# Patient Record
Sex: Female | Born: 1960 | Race: White | Hispanic: No | Marital: Married | Smoking: Never smoker
Health system: Southern US, Community
[De-identification: ages and names within clinical notes are randomized; demographics above are authoritative.]

## PROBLEM LIST (undated history)

## (undated) ENCOUNTER — Emergency Department (HOSPITAL_COMMUNITY): Admission: EM | Payer: Self-pay | Source: Home / Self Care

## (undated) DIAGNOSIS — I10 Essential (primary) hypertension: Secondary | ICD-10-CM

## (undated) DIAGNOSIS — D869 Sarcoidosis, unspecified: Secondary | ICD-10-CM

## (undated) DIAGNOSIS — N393 Stress incontinence (female) (male): Secondary | ICD-10-CM

## (undated) DIAGNOSIS — D071 Carcinoma in situ of vulva: Secondary | ICD-10-CM

## (undated) DIAGNOSIS — J349 Unspecified disorder of nose and nasal sinuses: Secondary | ICD-10-CM

## (undated) DIAGNOSIS — Z86010 Personal history of colonic polyps: Secondary | ICD-10-CM

## (undated) DIAGNOSIS — R87629 Unspecified abnormal cytological findings in specimens from vagina: Secondary | ICD-10-CM

## (undated) DIAGNOSIS — E079 Disorder of thyroid, unspecified: Secondary | ICD-10-CM

## (undated) DIAGNOSIS — E785 Hyperlipidemia, unspecified: Secondary | ICD-10-CM

## (undated) DIAGNOSIS — Z87411 Personal history of vaginal dysplasia: Secondary | ICD-10-CM

## (undated) DIAGNOSIS — R000 Tachycardia, unspecified: Secondary | ICD-10-CM

## (undated) DIAGNOSIS — U071 COVID-19: Secondary | ICD-10-CM

## (undated) DIAGNOSIS — E039 Hypothyroidism, unspecified: Secondary | ICD-10-CM

## (undated) DIAGNOSIS — T7840XA Allergy, unspecified, initial encounter: Secondary | ICD-10-CM

## (undated) DIAGNOSIS — Z860101 Personal history of adenomatous and serrated colon polyps: Secondary | ICD-10-CM

## (undated) DIAGNOSIS — A64 Unspecified sexually transmitted disease: Secondary | ICD-10-CM

## (undated) DIAGNOSIS — D863 Sarcoidosis of skin: Secondary | ICD-10-CM

## (undated) DIAGNOSIS — Z87410 Personal history of cervical dysplasia: Secondary | ICD-10-CM

## (undated) DIAGNOSIS — Z973 Presence of spectacles and contact lenses: Secondary | ICD-10-CM

## (undated) HISTORY — DX: Disorder of thyroid, unspecified: E07.9

## (undated) HISTORY — DX: Hyperlipidemia, unspecified: E78.5

## (undated) HISTORY — PX: BREAST SURGERY: SHX581

## (undated) HISTORY — DX: Allergy, unspecified, initial encounter: T78.40XA

## (undated) HISTORY — PX: TEAR DUCT PROBING: SHX793

## (undated) HISTORY — DX: Tachycardia, unspecified: R00.0

## (undated) HISTORY — PX: ABDOMINAL HYSTERECTOMY: SHX81

## (undated) HISTORY — DX: COVID-19: U07.1

## (undated) HISTORY — DX: Unspecified abnormal cytological findings in specimens from vagina: R87.629

## (undated) HISTORY — DX: Essential (primary) hypertension: I10

## (undated) HISTORY — DX: Unspecified sexually transmitted disease: A64

## (undated) HISTORY — DX: Sarcoidosis, unspecified: D86.9

---

## 1997-09-17 HISTORY — PX: BREAST EXCISIONAL BIOPSY: SUR124

## 1998-09-17 HISTORY — PX: TOTAL ABDOMINAL HYSTERECTOMY W/ BILATERAL SALPINGOOPHORECTOMY: SHX83

## 1998-09-17 HISTORY — PX: VAGINAL HYSTERECTOMY: SUR661

## 1999-09-18 HISTORY — PX: LACRIMAL DUCT EXPLORATION: SHX6569

## 2005-02-13 ENCOUNTER — Ambulatory Visit: Payer: Self-pay

## 2006-04-02 ENCOUNTER — Ambulatory Visit: Payer: Self-pay

## 2007-11-19 ENCOUNTER — Ambulatory Visit: Payer: Self-pay

## 2008-03-15 ENCOUNTER — Ambulatory Visit: Payer: Self-pay | Admitting: Dermatology

## 2008-06-21 ENCOUNTER — Ambulatory Visit: Payer: Self-pay | Admitting: Internal Medicine

## 2009-08-08 ENCOUNTER — Ambulatory Visit: Payer: Self-pay

## 2010-11-08 ENCOUNTER — Ambulatory Visit: Payer: Self-pay

## 2012-01-31 ENCOUNTER — Ambulatory Visit: Payer: Self-pay

## 2012-09-17 HISTORY — PX: COLONOSCOPY: SHX174

## 2012-09-29 ENCOUNTER — Ambulatory Visit: Payer: Self-pay | Admitting: Gastroenterology

## 2012-09-29 LAB — HM COLONOSCOPY

## 2013-07-24 ENCOUNTER — Ambulatory Visit: Payer: Self-pay

## 2014-10-21 ENCOUNTER — Ambulatory Visit: Payer: Self-pay | Admitting: Nurse Practitioner

## 2015-05-04 ENCOUNTER — Other Ambulatory Visit: Payer: Self-pay | Admitting: Unknown Physician Specialty

## 2015-05-04 NOTE — Telephone Encounter (Signed)
Pt called stated she needs a refill Trotranalol. Pharm is [facility]. Thanks.

## 2015-05-05 MED ORDER — PROPRANOLOL HCL 10 MG PO TABS: 10.0000 mg | ORAL_TABLET | Freq: Three times a day (TID) | ORAL | Status: DC

## 2015-05-05 NOTE — Telephone Encounter (Signed)
I looked in practice partner and I think Danielle Sanford meant propranolol. Patient was last seen on 10/19/14, practice partner number is [phone], and pharmacy is [facility].

## 2015-05-05 NOTE — Telephone Encounter (Signed)
Pt needs seen further refills 

## 2015-05-11 NOTE — Telephone Encounter (Signed)
Pt scheduled for 05/20/15 @ 4pm. Thanks.

## 2015-05-19 DIAGNOSIS — D869 Sarcoidosis, unspecified: Secondary | ICD-10-CM

## 2015-05-19 DIAGNOSIS — J309 Allergic rhinitis, unspecified: Secondary | ICD-10-CM | POA: Insufficient documentation

## 2015-05-19 DIAGNOSIS — R000 Tachycardia, unspecified: Secondary | ICD-10-CM

## 2015-05-19 DIAGNOSIS — E785 Hyperlipidemia, unspecified: Secondary | ICD-10-CM

## 2015-05-19 DIAGNOSIS — E039 Hypothyroidism, unspecified: Secondary | ICD-10-CM

## 2015-05-19 DIAGNOSIS — E063 Autoimmune thyroiditis: Secondary | ICD-10-CM | POA: Insufficient documentation

## 2015-05-19 DIAGNOSIS — I10 Essential (primary) hypertension: Secondary | ICD-10-CM

## 2015-05-20 ENCOUNTER — Ambulatory Visit (INDEPENDENT_AMBULATORY_CARE_PROVIDER_SITE_OTHER): Payer: BLUE CROSS/BLUE SHIELD | Admitting: Unknown Physician Specialty

## 2015-05-20 ENCOUNTER — Encounter: Payer: Self-pay | Admitting: Unknown Physician Specialty

## 2015-05-20 VITALS — BP 143/85 | HR 80 | Temp 97.8°F | Ht 68.1 in | Wt 214.0 lb

## 2015-05-20 DIAGNOSIS — I10 Essential (primary) hypertension: Secondary | ICD-10-CM | POA: Diagnosis not present

## 2015-05-20 DIAGNOSIS — E785 Hyperlipidemia, unspecified: Secondary | ICD-10-CM

## 2015-05-20 DIAGNOSIS — E039 Hypothyroidism, unspecified: Secondary | ICD-10-CM

## 2015-05-20 LAB — LIPID PANEL PICCOLO, WAIVED
Chol/HDL Ratio Piccolo,Waive: 4.5 mg/dL
Cholesterol Piccolo, Waived: 133 mg/dL (ref <200)
HDL Chol Piccolo, Waived: 30 mg/dL — ABNORMAL LOW (ref >59)
LDL Chol Calc Piccolo Waived: 59 mg/dL (ref <100)
Triglycerides Piccolo,Waived: 222 mg/dL — ABNORMAL HIGH (ref <150)
VLDL Chol Calc Piccolo,Waive: 44 mg/dL — ABNORMAL HIGH (ref <30)

## 2015-05-20 LAB — MICROALBUMIN, URINE WAIVED
Creatinine, Urine Waived: 200 mg/dL (ref 10–300)
MICROALB, UR WAIVED: 10 mg/L (ref 0–19)
Microalb/Creat Ratio: <30 mg/g (ref <30)

## 2015-05-20 NOTE — Progress Notes (Signed)
   BP 143/85 mmHg  Pulse 80  Temp(Src) 97.8 F (36.6 C)  Ht 5' 8.1" (1.73 m)  Wt 214 lb (97.07 kg)  BMI 32.43 kg/m2  SpO2 95%  LMP  (LMP Unknown)   Subjective:    Patient ID: Danielle Sanford, female    DOB: Apr 28, 1961, 54 y.o.   MRN: [id number]  HPI: Danielle Sanford is a 54 y.o. female  Chief Complaint  Patient presents with  . Hyperlipidemia  . Hypertension  . Hypothyroidism   Hypertension/Hyperlipidemia: She does not monitor blood pressure at home. This is elevated at todays visit but she has not taken her second dose of propanolol for the day. She is compliant with her propanolol and atorvastatin. She denies headaches, chest pain, or shortness of breath.   Hypothyroidism: She is still fatigued even after increase in dose of levothyroxine in February. She is compliant and take medication daily as directed.   Relevant past medical, surgical, family and social history reviewed and updated as indicated. Interim medical history since our last visit reviewed. Allergies and medications reviewed and updated.  Review of Systems  Constitutional: Negative.  Negative for activity change, appetite change and fatigue.  HENT: Negative.   Respiratory: Negative.  Negative for cough, choking, chest tightness, shortness of breath, wheezing and stridor.   Cardiovascular: Negative.  Negative for chest pain, palpitations and leg swelling.  Psychiatric/Behavioral: Negative.  Negative for sleep disturbance and agitation. The patient is not nervous/anxious.     Per HPI unless specifically indicated above     Objective:    BP 143/85 mmHg  Pulse 80  Temp(Src) 97.8 F (36.6 C)  Ht 5' 8.1" (1.73 m)  Wt 214 lb (97.07 kg)  BMI 32.43 kg/m2  SpO2 95%  LMP  (LMP Unknown)  Wt Readings from Last 3 Encounters:  05/20/15 214 lb (97.07 kg)  04/20/14 198 lb (89.812 kg)    Physical Exam  Constitutional: She is oriented to person, place, and time. She appears well-developed and well-nourished.  No distress.  HENT:  Head: Normocephalic and atraumatic.  Cardiovascular: Normal rate, regular rhythm and normal heart sounds.  Exam reveals no gallop and no friction rub.   No murmur heard. Pulmonary/Chest: Effort normal and breath sounds normal. No respiratory distress. She has no wheezes. She has no rales. She exhibits no tenderness.  Musculoskeletal: Normal range of motion.  Neurological: She is alert and oriented to person, place, and time.  Skin: Skin is warm and dry. No rash noted. She is not diaphoretic. No erythema. No pallor.  Psychiatric: She has a normal mood and affect. Her behavior is normal. Judgment and thought content normal.        Assessment & Plan:   Problem List Items Addressed This Visit      Unprioritized   Hyperlipidemia    Well controlled on atorvastatin .       Relevant Orders   Lipid Panel Piccolo, Waived   Hypothyroidism    Last TSH 7.28 on 10/19/14. Increased her levothyroxine to . Recheck TSH today, awaiting results.      Relevant Orders   TSH   Hypertension - Primary    Elevated blood pressure 143//85. Will recheck in three months      Relevant Orders   Comprehensive metabolic panel   Microalbumin, Urine Waived   Uric acid       Follow up plan: Return in about 3 months (around 08/19/2015) for to recheck blood pressure.

## 2015-05-20 NOTE — Assessment & Plan Note (Addendum)
Elevated blood pressure 143//85. Currently taking Propanolol  three times daily. Will recheck in three months. Monitor at home.

## 2015-05-20 NOTE — Assessment & Plan Note (Signed)
Last TSH 7.28 on 10/19/14. Increased her levothyroxine to . Recheck TSH today, awaiting results.

## 2015-05-20 NOTE — Assessment & Plan Note (Addendum)
Well controlled on atorvastatin . Levels improved today.

## 2015-05-21 LAB — COMPREHENSIVE METABOLIC PANEL
ALBUMIN: 3.9 g/dL (ref 3.5–5.5)
ALT: 16 IU/L (ref 0–32)
AST: 19 IU/L (ref 0–40)
Albumin/Globulin Ratio: 1.3 (ref 1.1–2.5)
Alkaline Phosphatase: 102 IU/L (ref 39–117)
BUN/Creatinine Ratio: 16 (ref 9–23)
BUN: 14 mg/dL (ref 6–24)
Bilirubin Total: 0.3 mg/dL (ref 0.0–1.2)
CALCIUM: 9.1 mg/dL (ref 8.7–10.2)
CO2: 26 mmol/L (ref 18–29)
CREATININE: 0.9 mg/dL (ref 0.57–1.00)
Chloride: 100 mmol/L (ref 97–108)
GFR calc non Af Amer: 73 mL/min/{1.73_m2} (ref >59)
GFR, EST AFRICAN AMERICAN: 84 mL/min/{1.73_m2} (ref >59)
GLOBULIN, TOTAL: 3 g/dL (ref 1.5–4.5)
Glucose: 94 mg/dL (ref 65–99)
Potassium: 4 mmol/L (ref 3.5–5.2)
SODIUM: 140 mmol/L (ref 134–144)
TOTAL PROTEIN: 6.9 g/dL (ref 6.0–8.5)

## 2015-05-21 LAB — TSH: TSH: 8.98 u[IU]/mL — ABNORMAL HIGH (ref 0.450–4.500)

## 2015-05-21 LAB — URIC ACID: Uric Acid: 5 mg/dL (ref 2.5–7.1)

## 2015-05-24 ENCOUNTER — Other Ambulatory Visit: Payer: Self-pay | Admitting: Unknown Physician Specialty

## 2015-05-24 DIAGNOSIS — E039 Hypothyroidism, unspecified: Secondary | ICD-10-CM

## 2015-05-24 MED ORDER — LEVOTHYROXINE SODIUM 150 MCG PO TABS: 150.0000 ug | ORAL_TABLET | Freq: Every day | ORAL | Status: DC

## 2015-05-24 NOTE — Progress Notes (Signed)
Reviewed TSH level with patient. She reports being compliant and taking her levothyroxine every morning. TSH was 7.28 in February and her levothyroxine dose was increased to . At recheck on 9/2 her TSH level was 8.98. We will increase her levothyroxine to and repeat labs in three months. Patient aware of plan and verbalizes understanding. New prescription sent to [facility].

## 2015-07-19 ENCOUNTER — Telehealth: Payer: Self-pay | Admitting: Unknown Physician Specialty

## 2015-07-19 MED ORDER — PROPRANOLOL HCL 10 MG PO TABS: 10.0000 mg | ORAL_TABLET | Freq: Three times a day (TID) | ORAL | Status: DC

## 2015-07-19 NOTE — Telephone Encounter (Signed)
[facility] the [facility] [place] so if you decide to refill this, please pay attention to [facility] and then we can change the pharmacy back to [facility] and send in more to them.

## 2015-07-19 NOTE — Telephone Encounter (Signed)
Pt came by needs a refill on Propanolol. Pt stated she is completely out of the medication. Please send RX to pharmacy ASAP. Pt  needs this medication sent to [facility] pt also needs enough to last until these through the [facility] [place] [place]. Thanks.

## 2015-07-19 NOTE — Telephone Encounter (Signed)
Called and let patient know that medications were sent in.  

## 2015-07-19 NOTE — Telephone Encounter (Signed)
Pt is due to be seen.  I will call in [facility] for [facility]

## 2015-07-19 NOTE — Telephone Encounter (Signed)
Patient was seen on 05/20/15 and has a 3 month f/u scheduled for 08/19/15.

## 2015-07-19 NOTE — Telephone Encounter (Signed)
Thanks, didn't see that

## 2015-08-19 ENCOUNTER — Encounter: Payer: Self-pay | Admitting: Unknown Physician Specialty

## 2015-08-19 ENCOUNTER — Ambulatory Visit (INDEPENDENT_AMBULATORY_CARE_PROVIDER_SITE_OTHER): Payer: BLUE CROSS/BLUE SHIELD | Admitting: Unknown Physician Specialty

## 2015-08-19 VITALS — BP 113/79 | HR 84 | Temp 98.3°F | Ht 67.0 in | Wt 218.0 lb

## 2015-08-19 DIAGNOSIS — E039 Hypothyroidism, unspecified: Secondary | ICD-10-CM

## 2015-08-19 DIAGNOSIS — Z23 Encounter for immunization: Secondary | ICD-10-CM

## 2015-08-19 DIAGNOSIS — I10 Essential (primary) hypertension: Secondary | ICD-10-CM

## 2015-08-19 MED ORDER — ATORVASTATIN CALCIUM 20 MG PO TABS: 20.0000 mg | ORAL_TABLET | Freq: Every day | ORAL | Status: DC

## 2015-08-19 NOTE — Addendum Note (Signed)
Addended by: Gabriel CirriWICKER, Vanshika Jastrzebski on: 08/19/2015 04:41 PM   Modules accepted: Orders, SmartSet

## 2015-08-19 NOTE — Progress Notes (Signed)
BP 113/79 mmHg  Pulse 84  Temp(Src) 98.3 F (36.8 C)  Ht  (1.702 m)  Wt 218 lb (98.884 kg)  BMI 34.14 kg/m2  SpO2 96%  LMP  (LMP Unknown)   Subjective:    Patient ID: Danielle Sanford, female    DOB: 06/17/1961, 54 y.o.   MRN: [id number]  HPI: Danielle Sanford is a 54 y.o. female  Chief Complaint  Patient presents with  . Hyperlipidemia  . Hypothyroidism  . Medication Refill    pt states she needs refill on all medications   Hypothyroid Adjusting doses.  Needs a TSH today.    Hypertension Using medications without difficulty Average home BPs   No problems or lightheadedness No chest pain with exertion or shortness of breath No Edema   Relevant past medical, surgical, family and social history reviewed and updated as indicated. Interim medical history since our last visit reviewed. Allergies and medications reviewed and updated.  Review of Systems  Per HPI unless specifically indicated above     Objective:    BP 113/79 mmHg  Pulse 84  Temp(Src) 98.3 F (36.8 C)  Ht  (1.702 m)  Wt 218 lb (98.884 kg)  BMI 34.14 kg/m2  SpO2 96%  LMP  (LMP Unknown)  Wt Readings from Last 3 Encounters:  08/19/15 218 lb (98.884 kg)  05/20/15 214 lb (97.07 kg)  04/20/14 198 lb (89.812 kg)    Physical Exam  Constitutional: She is oriented to person, place, and time. She appears well-developed and well-nourished. No distress.  HENT:  Head: Normocephalic and atraumatic.  Eyes: Conjunctivae and lids are normal. Right eye exhibits no discharge. Left eye exhibits no discharge. No scleral icterus.  Neck: Normal range of motion. Neck supple. No JVD present. Carotid bruit is not present.  Cardiovascular: Normal rate, regular rhythm and normal heart sounds.   Pulmonary/Chest: Effort normal and breath sounds normal.  Abdominal: Normal appearance. There is no splenomegaly or hepatomegaly.  Musculoskeletal: Normal range of motion.  Neurological: She is alert and oriented  to person, place, and time.  Skin: Skin is warm, dry and intact. No rash noted. No pallor.  Psychiatric: She has a normal mood and affect. Her behavior is normal. Judgment and thought content normal.    Results for orders placed or performed in visit on 05/20/15  TSH  Result Value Ref Range   TSH 8.980 (H) 0.450 - 4.500 uIU/mL  Comprehensive metabolic panel  Result Value Ref Range   Glucose 94 65 - 99 mg/dL   BUN 14 6 - 24 mg/dL   Creatinine, Ser 4.09 0.57 - 1.00 mg/dL   GFR calc non Af Amer 73 >59 mL/min/1.73   GFR calc Af Amer 84 >59 mL/min/1.73   BUN/Creatinine Ratio 16 9 - 23   Sodium 140 134 - 144 mmol/L   Potassium 4.0 3.5 - 5.2 mmol/L   Chloride 100 97 - 108 mmol/L   CO2 26 18 - 29 mmol/L   Calcium 9.1 8.7 - 10.2 mg/dL   Total Protein 6.9 6.0 - 8.5 g/dL   Albumin 3.9 3.5 - 5.5 g/dL   Globulin, Total 3.0 1.5 - 4.5 g/dL   Albumin/Globulin Ratio 1.3 1.1 - 2.5   Bilirubin Total 0.3 0.0 - 1.2 mg/dL   Alkaline Phosphatase 102 39 - 117 IU/L   AST 19 0 - 40 IU/L   ALT 16 0 - 32 IU/L  Microalbumin, Urine Waived  Result Value Ref Range   Microalb, Ur  Waived 10 0 - 19 mg/L   Creatinine, Urine Waived 200 10 - 300 mg/dL   Microalb/Creat Ratio <30 <30 mg/g  Uric acid  Result Value Ref Range   Uric Acid 5.0 2.5 - 7.1 mg/dL  Lipid Panel Piccolo, Waived  Result Value Ref Range   Cholesterol Piccolo, Waived 133 <200 mg/dL   HDL Chol Piccolo, Waived 30 (L) >59 mg/dL   Triglycerides Piccolo,Waived 222 (H) <150 mg/dL   Chol/HDL Ratio Piccolo,Waive 4.5 mg/dL   LDL Chol Calc Piccolo Waived 59 <100 mg/dL   VLDL Chol Calc Piccolo,Waive 44 (H) <30 mg/dL      Assessment & Plan:   Problem List Items Addressed This Visit      Unprioritized   Hypothyroidism    Check TSH      Hypertension    Stable, continue present medications.       Relevant Medications   atorvastatin (LIPITOR) 20 MG tablet    Other Visit Diagnoses    Immunization due    -  Primary    Relevant Orders     Flu Vaccine QUAD 36+ mos IM (Completed)        Follow up plan: Return in about 6 months (around 02/17/2016).

## 2015-08-19 NOTE — Assessment & Plan Note (Signed)
Stable, continue present medications.   

## 2015-08-19 NOTE — Assessment & Plan Note (Signed)
Check TSH 

## 2015-08-20 LAB — TSH: TSH: 6.31 u[IU]/mL — ABNORMAL HIGH (ref 0.450–4.500)

## 2015-08-22 ENCOUNTER — Other Ambulatory Visit: Payer: Self-pay | Admitting: Unknown Physician Specialty

## 2015-08-22 DIAGNOSIS — E039 Hypothyroidism, unspecified: Secondary | ICD-10-CM

## 2015-08-22 MED ORDER — LEVOTHYROXINE SODIUM 175 MCG PO TABS: 175.0000 ug | ORAL_TABLET | Freq: Every day | ORAL | Status: DC

## 2015-10-26 ENCOUNTER — Other Ambulatory Visit: Payer: Self-pay

## 2015-10-26 MED ORDER — LEVOTHYROXINE SODIUM 175 MCG PO TABS: 175.0000 ug | ORAL_TABLET | Freq: Every day | ORAL | Status: DC

## 2015-10-26 NOTE — Telephone Encounter (Signed)
Patient was last seen 08/19/15 and has appointment 02/24/16. Pharmacy is [facility].

## 2015-12-28 DIAGNOSIS — J3489 Other specified disorders of nose and nasal sinuses: Secondary | ICD-10-CM | POA: Diagnosis not present

## 2015-12-28 DIAGNOSIS — Z6833 Body mass index (BMI) 33.0-33.9, adult: Secondary | ICD-10-CM | POA: Diagnosis not present

## 2015-12-28 DIAGNOSIS — J310 Chronic rhinitis: Secondary | ICD-10-CM | POA: Diagnosis not present

## 2015-12-29 DIAGNOSIS — Z1272 Encounter for screening for malignant neoplasm of vagina: Secondary | ICD-10-CM | POA: Diagnosis not present

## 2015-12-29 DIAGNOSIS — Z87898 Personal history of other specified conditions: Secondary | ICD-10-CM | POA: Diagnosis not present

## 2015-12-29 DIAGNOSIS — R87622 Low grade squamous intraepithelial lesion on cytologic smear of vagina (LGSIL): Secondary | ICD-10-CM | POA: Diagnosis not present

## 2016-01-02 DIAGNOSIS — Z79899 Other long term (current) drug therapy: Secondary | ICD-10-CM | POA: Diagnosis not present

## 2016-01-02 DIAGNOSIS — D863 Sarcoidosis of skin: Secondary | ICD-10-CM | POA: Diagnosis not present

## 2016-01-02 DIAGNOSIS — L820 Inflamed seborrheic keratosis: Secondary | ICD-10-CM | POA: Diagnosis not present

## 2016-01-05 ENCOUNTER — Other Ambulatory Visit: Payer: Self-pay

## 2016-01-05 NOTE — Telephone Encounter (Signed)
Patient called stating she needs a refill on her Levothyroxine 175mcg tablets #90 sent to [facility].    Date Rx Last Written: 10/26/2015

## 2016-01-06 MED ORDER — LEVOTHYROXINE SODIUM 175 MCG PO TABS: 175.0000 ug | ORAL_TABLET | Freq: Every day | ORAL | Status: DC

## 2016-02-08 DIAGNOSIS — D869 Sarcoidosis, unspecified: Secondary | ICD-10-CM | POA: Diagnosis not present

## 2016-02-08 DIAGNOSIS — Z6834 Body mass index (BMI) 34.0-34.9, adult: Secondary | ICD-10-CM | POA: Diagnosis not present

## 2016-02-08 DIAGNOSIS — J310 Chronic rhinitis: Secondary | ICD-10-CM | POA: Diagnosis not present

## 2016-02-24 ENCOUNTER — Encounter: Payer: Self-pay | Admitting: Unknown Physician Specialty

## 2016-02-24 ENCOUNTER — Ambulatory Visit (INDEPENDENT_AMBULATORY_CARE_PROVIDER_SITE_OTHER): Payer: BLUE CROSS/BLUE SHIELD | Admitting: Unknown Physician Specialty

## 2016-02-24 VITALS — BP 114/67 | HR 91 | Temp 98.1°F | Ht 66.5 in | Wt 222.0 lb

## 2016-02-24 DIAGNOSIS — E785 Hyperlipidemia, unspecified: Secondary | ICD-10-CM

## 2016-02-24 DIAGNOSIS — I10 Essential (primary) hypertension: Secondary | ICD-10-CM | POA: Diagnosis not present

## 2016-02-24 DIAGNOSIS — Z0000 Encounter for general adult medical examination without abnormal findings: Secondary | ICD-10-CM | POA: Diagnosis not present

## 2016-02-24 DIAGNOSIS — E039 Hypothyroidism, unspecified: Secondary | ICD-10-CM

## 2016-02-24 NOTE — Assessment & Plan Note (Signed)
Stable, continue present medications.   

## 2016-02-24 NOTE — Assessment & Plan Note (Signed)
Not fasting today.  On an appropriate statin dose

## 2016-02-24 NOTE — Progress Notes (Signed)
BP 114/67 mmHg  Pulse 91  Temp(Src) 98.1 F (36.7 C)  Ht 5' 6.5" (1.689 m)  Wt 222 lb (100.699 kg)  BMI 35.30 kg/m2  SpO2 93%  LMP  (LMP Unknown)   Subjective:    Patient ID: Danielle Sanford, female    DOB: 03/24/1961, 55 y.o.   MRN: [id number]  HPI: Danielle Sanford is a 55 y.o. female  Chief Complaint  Patient presents with  . Hyperlipidemia  . Hypertension  . Hypothyroidism   Refusing HIV and Hep C as she feels she already had them done  Hypertension Using medications without difficulty Average home BPs. Not checking  No problems or lightheadedness No chest pain with exertion or shortness of breath No Edema  Hyperlipidemia Using medications without problems: No Muscle aches  Diet compliance: "I eat too much" Exercise: walks her dog  Hypothyroid Energy level is better than it has been in a long time.  No problems with constipation.  No problems with depression  Relevant past medical, surgical, family and social history reviewed and updated as indicated. Interim medical history since our last visit reviewed. Allergies and medications reviewed and updated.  Review of Systems  Constitutional: Negative.   HENT: Negative.   Eyes: Negative.   Respiratory: Negative.   Cardiovascular: Negative.   Gastrointestinal: Negative.   Endocrine: Negative.   Genitourinary: Negative.   Musculoskeletal: Negative.   Skin: Negative.   Allergic/Immunologic: Negative.   Neurological: Negative.   Hematological: Negative.   Psychiatric/Behavioral: Negative.     Per HPI unless specifically indicated above     Objective:    BP 114/67 mmHg  Pulse 91  Temp(Src) 98.1 F (36.7 C)  Ht 5' 6.5" (1.689 m)  Wt 222 lb (100.699 kg)  BMI 35.30 kg/m2  SpO2 93%  LMP  (LMP Unknown)  Wt Readings from Last 3 Encounters:  02/24/16 222 lb (100.699 kg)  08/19/15 218 lb (98.884 kg)  05/20/15 214 lb (97.07 kg)    Physical Exam  Constitutional: She is oriented to person, place,  and time. She appears well-developed and well-nourished. No distress.  HENT:  Head: Normocephalic and atraumatic.  Eyes: Conjunctivae and lids are normal. Right eye exhibits no discharge. Left eye exhibits no discharge. No scleral icterus.  Neck: Normal range of motion. Neck supple. No JVD present. Carotid bruit is not present.  Cardiovascular: Normal rate, regular rhythm and normal heart sounds.   Pulmonary/Chest: Effort normal and breath sounds normal.  Abdominal: Normal appearance. There is no splenomegaly or hepatomegaly.  Musculoskeletal: Normal range of motion.  Neurological: She is alert and oriented to person, place, and time.  Skin: Skin is warm, dry and intact. No rash noted. No pallor.  Psychiatric: She has a normal mood and affect. Her behavior is normal. Judgment and thought content normal.    Results for orders placed or performed in visit on 08/19/15  TSH  Result Value Ref Range   TSH 6.310 (H) 0.450 - 4.500 uIU/mL      Assessment & Plan:   Problem List Items Addressed This Visit      Unprioritized   Hyperlipidemia    Not fasting today.  On an appropriate statin dose      Hypertension    Stable, continue present medications.        Hypothyroidism - Primary    Check TSH      Relevant Orders   TSH    Other Visit Diagnoses    Routine general medical examination at  a health care facility        Relevant Orders    MM DIGITAL SCREENING BILATERAL        Follow up plan: Return in about 6 months (around 08/25/2016).

## 2016-02-24 NOTE — Assessment & Plan Note (Signed)
Check TSH 

## 2016-02-25 LAB — TSH: TSH: 2.98 u[IU]/mL (ref 0.450–4.500)

## 2016-04-13 DIAGNOSIS — D863 Sarcoidosis of skin: Secondary | ICD-10-CM | POA: Diagnosis not present

## 2016-04-16 ENCOUNTER — Other Ambulatory Visit: Payer: Self-pay

## 2016-04-16 MED ORDER — PROPRANOLOL HCL 10 MG PO TABS: 10.0000 mg | ORAL_TABLET | Freq: Three times a day (TID) | ORAL | 3 refills | Status: DC

## 2016-05-17 DIAGNOSIS — J3489 Other specified disorders of nose and nasal sinuses: Secondary | ICD-10-CM | POA: Diagnosis not present

## 2016-05-17 DIAGNOSIS — E785 Hyperlipidemia, unspecified: Secondary | ICD-10-CM | POA: Diagnosis not present

## 2016-05-17 DIAGNOSIS — E039 Hypothyroidism, unspecified: Secondary | ICD-10-CM | POA: Diagnosis not present

## 2016-05-17 DIAGNOSIS — M950 Acquired deformity of nose: Secondary | ICD-10-CM | POA: Diagnosis not present

## 2016-05-17 DIAGNOSIS — D8689 Sarcoidosis of other sites: Secondary | ICD-10-CM | POA: Diagnosis not present

## 2016-05-17 DIAGNOSIS — Z87891 Personal history of nicotine dependence: Secondary | ICD-10-CM | POA: Diagnosis not present

## 2016-05-17 DIAGNOSIS — D869 Sarcoidosis, unspecified: Secondary | ICD-10-CM | POA: Diagnosis not present

## 2016-05-22 ENCOUNTER — Other Ambulatory Visit: Payer: Self-pay | Admitting: Unknown Physician Specialty

## 2016-07-16 DIAGNOSIS — D863 Sarcoidosis of skin: Secondary | ICD-10-CM | POA: Diagnosis not present

## 2016-07-16 DIAGNOSIS — R21 Rash and other nonspecific skin eruption: Secondary | ICD-10-CM | POA: Diagnosis not present

## 2016-08-30 ENCOUNTER — Ambulatory Visit: Payer: BLUE CROSS/BLUE SHIELD | Admitting: Unknown Physician Specialty

## 2016-08-30 ENCOUNTER — Ambulatory Visit (INDEPENDENT_AMBULATORY_CARE_PROVIDER_SITE_OTHER): Payer: BLUE CROSS/BLUE SHIELD | Admitting: Unknown Physician Specialty

## 2016-08-30 ENCOUNTER — Encounter: Payer: Self-pay | Admitting: Unknown Physician Specialty

## 2016-08-30 VITALS — BP 125/79 | HR 86 | Temp 97.6°F | Ht 68.0 in | Wt 227.8 lb

## 2016-08-30 DIAGNOSIS — I10 Essential (primary) hypertension: Secondary | ICD-10-CM | POA: Diagnosis not present

## 2016-08-30 DIAGNOSIS — Z23 Encounter for immunization: Secondary | ICD-10-CM | POA: Diagnosis not present

## 2016-08-30 DIAGNOSIS — E039 Hypothyroidism, unspecified: Secondary | ICD-10-CM

## 2016-08-30 DIAGNOSIS — E782 Mixed hyperlipidemia: Secondary | ICD-10-CM

## 2016-08-30 LAB — MICROALBUMIN, URINE WAIVED
Creatinine, Urine Waived: 200 mg/dL (ref 10–300)
Microalb, Ur Waived: 30 mg/L — ABNORMAL HIGH (ref 0–19)
Microalb/Creat Ratio: <30 mg/g (ref <30)

## 2016-08-30 NOTE — Progress Notes (Signed)
BP 125/79 (BP Location: Left Arm, Patient Position: Sitting, Cuff Size: Large)   Pulse 86   Temp 97.6 F (36.4 C)   Ht 5\' 8"  (1.727 m) Comment: pt had shoes on  Wt 227 lb 12.8 oz (103.3 kg) Comment: pt had shoes on  LMP  (LMP Unknown)   SpO2 97%   BMI 34.64 kg/m    Subjective:    Patient ID: Danielle Sanford, female    DOB: Dec 31, 1960, 55 y.o.   MRN: [id number]  HPI: Danielle JunesCynthia M Sparano is a 55 y.o. female  Chief Complaint  Patient presents with  . Hyperlipidemia  . Hypertension  . Hypothyroidism   Hypertension Using medications without difficulty Average home BPs. "once in a while" and similar to here     No problems or lightheadedness No chest pain with exertion or shortness of breath No Edema  Hyperlipidemia Using medications without problems No Muscle aches  Diet compliance: "Not good" Exercise: walks her dog.  No other exercise  Hypothyroid Energy level is better than it has been in a long time.  No problems with constipation.  No problems with depression  Relevant past medical, surgical, family and social history reviewed and updated as indicated. Interim medical history since our last visit reviewed. Allergies and medications reviewed and updated.  Review of Systems  Per HPI unless specifically indicated above     Objective:    BP 125/79 (BP Location: Left Arm, Patient Position: Sitting, Cuff Size: Large)   Pulse 86   Temp 97.6 F (36.4 C)   Ht 5\' 8"  (1.727 m) Comment: pt had shoes on  Wt 227 lb 12.8 oz (103.3 kg) Comment: pt had shoes on  LMP  (LMP Unknown)   SpO2 97%   BMI 34.64 kg/m   Wt Readings from Last 3 Encounters:  08/30/16 227 lb 12.8 oz (103.3 kg)  02/24/16 222 lb (100.7 kg)  08/19/15 218 lb (98.9 kg)    Physical Exam  Constitutional: She is oriented to person, place, and time. She appears well-developed and well-nourished. No distress.  HENT:  Head: Normocephalic and atraumatic.  Eyes: Conjunctivae and lids are normal. Right  eye exhibits no discharge. Left eye exhibits no discharge. No scleral icterus.  Neck: Normal range of motion. Neck supple. No JVD present. Carotid bruit is not present.  Cardiovascular: Normal rate, regular rhythm and normal heart sounds.   Pulmonary/Chest: Effort normal and breath sounds normal.  Abdominal: Normal appearance. There is no splenomegaly or hepatomegaly.  Musculoskeletal: Normal range of motion.  Neurological: She is alert and oriented to person, place, and time.  Skin: Skin is warm, dry and intact. No rash noted. No pallor.  Psychiatric: She has a normal mood and affect. Her behavior is normal. Judgment and thought content normal.   Note pt is non-fasting and [facility]  Results for orders placed or performed in visit on 02/24/16  TSH  Result Value Ref Range   TSH 2.980 0.450 - 4.500 uIU/mL      Assessment & Plan:   Problem List Items Addressed This Visit      Unprioritized   Hyperlipidemia    Check Lipid panel but non fasting      Relevant Orders   Lipid Panel w/o Chol/HDL Ratio   Lipid Panel Piccolo, Waived   Hypertension    Stable, continue present medications.        Relevant Orders   Comprehensive metabolic panel   Microalbumin, Urine Waived   Uric acid  Hypothyroidism    Check TSH      Relevant Orders   TSH    Other Visit Diagnoses    Need for influenza vaccination    -  Primary   Relevant Orders   Flu Vaccine QUAD 36+ mos IM (Completed)       Follow up plan: Return in about 6 months (around 02/28/2017).

## 2016-08-30 NOTE — Assessment & Plan Note (Signed)
Stable, continue present medications.   

## 2016-08-30 NOTE — Patient Instructions (Signed)

## 2016-08-30 NOTE — Assessment & Plan Note (Signed)
Check Lipid panel but non fasting

## 2016-08-30 NOTE — Assessment & Plan Note (Signed)
Check TSH 

## 2016-08-31 ENCOUNTER — Encounter: Payer: Self-pay | Admitting: Unknown Physician Specialty

## 2016-08-31 ENCOUNTER — Ambulatory Visit: Payer: BLUE CROSS/BLUE SHIELD | Admitting: Unknown Physician Specialty

## 2016-08-31 ENCOUNTER — Telehealth: Payer: Self-pay | Admitting: Unknown Physician Specialty

## 2016-08-31 DIAGNOSIS — E039 Hypothyroidism, unspecified: Secondary | ICD-10-CM

## 2016-08-31 LAB — COMPREHENSIVE METABOLIC PANEL
ALK PHOS: 106 IU/L (ref 39–117)
ALT: 20 IU/L (ref 0–32)
AST: 15 IU/L (ref 0–40)
Albumin/Globulin Ratio: 1.2 (ref 1.2–2.2)
Albumin: 3.8 g/dL (ref 3.5–5.5)
BUN/Creatinine Ratio: 12 (ref 9–23)
BUN: 10 mg/dL (ref 6–24)
Bilirubin Total: 0.4 mg/dL (ref 0.0–1.2)
CHLORIDE: 101 mmol/L (ref 96–106)
CO2: 25 mmol/L (ref 18–29)
CREATININE: 0.83 mg/dL (ref 0.57–1.00)
Calcium: 9.3 mg/dL (ref 8.7–10.2)
GFR calc Af Amer: 91 mL/min/{1.73_m2} (ref >59)
GFR calc non Af Amer: 79 mL/min/{1.73_m2} (ref >59)
GLOBULIN, TOTAL: 3.3 g/dL (ref 1.5–4.5)
GLUCOSE: 132 mg/dL — AB (ref 65–99)
Potassium: 4.3 mmol/L (ref 3.5–5.2)
SODIUM: 142 mmol/L (ref 134–144)
Total Protein: 7.1 g/dL (ref 6.0–8.5)

## 2016-08-31 LAB — LIPID PANEL W/O CHOL/HDL RATIO
CHOLESTEROL TOTAL: 123 mg/dL (ref 100–199)
HDL: 27 mg/dL — ABNORMAL LOW (ref >39)
LDL CALC: 43 mg/dL (ref 0–99)
TRIGLYCERIDES: 264 mg/dL — AB (ref 0–149)
VLDL Cholesterol Cal: 53 mg/dL — ABNORMAL HIGH (ref 5–40)

## 2016-08-31 LAB — URIC ACID: Uric Acid: 4.9 mg/dL (ref 2.5–7.1)

## 2016-08-31 LAB — TSH: TSH: 7.3 u[IU]/mL — ABNORMAL HIGH (ref 0.450–4.500)

## 2016-08-31 NOTE — Telephone Encounter (Signed)
Discussed with pt labs OK for non-fasting.  TSH slightly elevated.  Recheck in 3 months

## 2016-09-05 DIAGNOSIS — J310 Chronic rhinitis: Secondary | ICD-10-CM | POA: Diagnosis not present

## 2016-09-05 DIAGNOSIS — D869 Sarcoidosis, unspecified: Secondary | ICD-10-CM | POA: Diagnosis not present

## 2016-09-05 DIAGNOSIS — Z6835 Body mass index (BMI) 35.0-35.9, adult: Secondary | ICD-10-CM | POA: Diagnosis not present

## 2016-09-19 ENCOUNTER — Telehealth: Payer: Self-pay | Admitting: Unknown Physician Specialty

## 2016-09-19 NOTE — Telephone Encounter (Signed)
Called and spoke to patient. She asked me what Elnita MaxwellCheryl said about her labs. I pulled up the patient's labs and told her that Elnita MaxwellCheryl wants to recheck her thyroid in about 3 months. Patient stated that she does remember this now and apologized because she did not remember this earlier. She stated that she thought Elnita MaxwellCheryl was changing the dose of her medication but she stated that she now remembers that Elnita MaxwellCheryl was going to but changed her mind. Patient apologized again and stated she would come by for a re check in about 3 months. I asked if patient needed any medication sent in and she stated she had plenty.

## 2016-10-12 ENCOUNTER — Other Ambulatory Visit: Payer: Self-pay | Admitting: Unknown Physician Specialty

## 2016-10-31 DIAGNOSIS — Z1231 Encounter for screening mammogram for malignant neoplasm of breast: Secondary | ICD-10-CM | POA: Diagnosis not present

## 2016-10-31 DIAGNOSIS — Z01419 Encounter for gynecological examination (general) (routine) without abnormal findings: Secondary | ICD-10-CM | POA: Diagnosis not present

## 2016-10-31 DIAGNOSIS — Z87898 Personal history of other specified conditions: Secondary | ICD-10-CM | POA: Diagnosis not present

## 2016-10-31 DIAGNOSIS — R87622 Low grade squamous intraepithelial lesion on cytologic smear of vagina (LGSIL): Secondary | ICD-10-CM | POA: Diagnosis not present

## 2016-10-31 DIAGNOSIS — Z1272 Encounter for screening for malignant neoplasm of vagina: Secondary | ICD-10-CM | POA: Diagnosis not present

## 2016-10-31 DIAGNOSIS — R87612 Low grade squamous intraepithelial lesion on cytologic smear of cervix (LGSIL): Secondary | ICD-10-CM | POA: Diagnosis not present

## 2016-12-11 ENCOUNTER — Telehealth: Payer: Self-pay | Admitting: Unknown Physician Specialty

## 2016-12-11 NOTE — Telephone Encounter (Signed)
Per patient Danielle Sanford wanted her to stop by to have her thyroid level rechecked, patient added to schedule for tomorrow

## 2016-12-11 NOTE — Telephone Encounter (Signed)
Thanks, order already in.

## 2016-12-12 ENCOUNTER — Other Ambulatory Visit: Payer: BLUE CROSS/BLUE SHIELD

## 2016-12-20 ENCOUNTER — Other Ambulatory Visit: Payer: BLUE CROSS/BLUE SHIELD

## 2016-12-20 DIAGNOSIS — E039 Hypothyroidism, unspecified: Secondary | ICD-10-CM

## 2016-12-21 ENCOUNTER — Encounter: Payer: Self-pay | Admitting: Unknown Physician Specialty

## 2016-12-21 LAB — THYROID PANEL WITH TSH
Free Thyroxine Index: 2.3 (ref 1.2–4.9)
T3 UPTAKE RATIO: 27 % (ref 24–39)
T4 TOTAL: 8.7 ug/dL (ref 4.5–12.0)
TSH: 4.62 u[IU]/mL — ABNORMAL HIGH (ref 0.450–4.500)

## 2016-12-26 ENCOUNTER — Telehealth: Payer: Self-pay | Admitting: Unknown Physician Specialty

## 2016-12-26 MED ORDER — LEVOTHYROXINE SODIUM 200 MCG PO TABS: 200.0000 ug | ORAL_TABLET | Freq: Every day | ORAL | 0 refills | Status: DC

## 2016-12-26 NOTE — Telephone Encounter (Signed)
Patient would like to speak with Danielle Sanford regarding her TSH level.  She has some questions regarding her medication dosage.  Thanks  [phone]

## 2016-12-26 NOTE — Telephone Encounter (Signed)
Patient returned my call. I let her know what Elnita Maxwell said about medication and labs.

## 2016-12-26 NOTE — Telephone Encounter (Signed)
That's fine, I don't mind changing.  Will b=need to recheck in 3 months before rx is finished

## 2016-12-26 NOTE — Telephone Encounter (Signed)
Called and spoke to patient. She stated that she got Cheryl's letter regarding her thyroid. Patient states that she is tired every now and then and asked if Elnita Maxwell could go ahead and change the dose of her medication to what it should be based on labs. Patient stated that if Elnita Maxwell really does not want to, then that's fine but she is willing to try it. Patient would like the medication sent to [facility].

## 2016-12-26 NOTE — Telephone Encounter (Signed)
Called and left patient a VM asking for her to please return my call.  

## 2017-03-01 ENCOUNTER — Ambulatory Visit (INDEPENDENT_AMBULATORY_CARE_PROVIDER_SITE_OTHER): Payer: BLUE CROSS/BLUE SHIELD | Admitting: Unknown Physician Specialty

## 2017-03-01 VITALS — BP 110/73 | HR 95 | Wt 220.0 lb

## 2017-03-01 DIAGNOSIS — I10 Essential (primary) hypertension: Secondary | ICD-10-CM | POA: Diagnosis not present

## 2017-03-01 DIAGNOSIS — Z1231 Encounter for screening mammogram for malignant neoplasm of breast: Secondary | ICD-10-CM | POA: Diagnosis not present

## 2017-03-01 DIAGNOSIS — E782 Mixed hyperlipidemia: Secondary | ICD-10-CM

## 2017-03-01 DIAGNOSIS — E039 Hypothyroidism, unspecified: Secondary | ICD-10-CM

## 2017-03-01 DIAGNOSIS — Z1239 Encounter for other screening for malignant neoplasm of breast: Secondary | ICD-10-CM

## 2017-03-01 MED ORDER — PROPRANOLOL HCL 10 MG PO TABS: 10.0000 mg | ORAL_TABLET | Freq: Three times a day (TID) | ORAL | 3 refills | Status: DC

## 2017-03-01 MED ORDER — LEVOTHYROXINE SODIUM 200 MCG PO TABS: 200.0000 ug | ORAL_TABLET | Freq: Every day | ORAL | 0 refills | Status: DC

## 2017-03-01 NOTE — Progress Notes (Signed)
BP 110/73   Pulse 95   Wt 220 lb (99.8 kg)   LMP  (LMP Unknown)   SpO2 (!) 78%   BMI 33.45 kg/m    Subjective:    Patient ID: Danielle Sanford, female    DOB: [birth year], 56 y.o.   MRN: [id number]  HPI: Danielle Sanford is a 56 y.o. female  Chief Complaint  Patient presents with  . Follow-up   Hypertension Using medications without difficulty Average home BPs About what it is here   No problems or lightheadedness No chest pain with exertion or shortness of breath No Edema   Hyperlipidemia Using medications without problems: No Muscle aches  Diet compliance:Exercise: walks the dog.  She has lost 7 pounds  Hypothyroid Energy is better.  Lost 7 pounds by watching what she eats.     Relevant past medical, surgical, family and social history reviewed and updated as indicated. Interim medical history since our last visit reviewed. Allergies and medications reviewed and updated.  Review of Systems  Per HPI unless specifically indicated above     Objective:    BP 110/73   Pulse 95   Wt 220 lb (99.8 kg)   LMP  (LMP Unknown)   SpO2 (!) 78%   BMI 33.45 kg/m   Wt Readings from Last 3 Encounters:  03/01/17 220 lb (99.8 kg)  08/30/16 227 lb 12.8 oz (103.3 kg)  02/24/16 222 lb (100.7 kg)    Physical Exam  Constitutional: She is oriented to person, place, and time. She appears well-developed and well-nourished. No distress.  HENT:  Head: Normocephalic and atraumatic.  Eyes: Conjunctivae and lids are normal. Right eye exhibits no discharge. Left eye exhibits no discharge. No scleral icterus.  Neck: Normal range of motion. Neck supple. No JVD present. Carotid bruit is not present.  Cardiovascular: Normal rate, regular rhythm and normal heart sounds.   Pulmonary/Chest: Effort normal and breath sounds normal.  Abdominal: Normal appearance. There is no splenomegaly or hepatomegaly.  Musculoskeletal: Normal range of motion.  Neurological: She is alert and oriented  to person, place, and time.  Skin: Skin is warm, dry and intact. No rash noted. No pallor.  Psychiatric: She has a normal mood and affect. Her behavior is normal. Judgment and thought content normal.    Results for orders placed or performed in visit on 12/20/16  Thyroid Panel With TSH  Result Value Ref Range   TSH 4.620 (H) 0.450 - 4.500 uIU/mL   T4, Total 8.7 4.5 - 12.0 ug/dL   T3 Uptake Ratio 27 24 - 39 %   Free Thyroxine Index 2.3 1.2 - 4.9      Assessment & Plan:   Problem List Items Addressed This Visit      Unprioritized   Hyperlipidemia - Primary    Will get fasting next visit      Relevant Medications   propranolol (INDERAL) 10 MG tablet   Hypertension    Stable, continue present medications.        Relevant Medications   propranolol (INDERAL) 10 MG tablet   Other Relevant Orders   Comprehensive metabolic panel   Hypothyroidism    Check TSH      Relevant Medications   levothyroxine (SYNTHROID, LEVOTHROID) 200 MCG tablet   propranolol (INDERAL) 10 MG tablet   Other Relevant Orders   TSH    Other Visit Diagnoses    Breast cancer screening       Relevant Orders   MM DIGITAL  SCREENING BILATERAL       Follow up plan: Return in about 6 months (around 08/31/2017) for physical.

## 2017-03-01 NOTE — Assessment & Plan Note (Signed)
Check TSH 

## 2017-03-01 NOTE — Assessment & Plan Note (Signed)
Will get fasting next visit

## 2017-03-01 NOTE — Assessment & Plan Note (Signed)
Stable, continue present medications.   

## 2017-03-02 LAB — COMPREHENSIVE METABOLIC PANEL
A/G RATIO: 1.3 (ref 1.2–2.2)
ALK PHOS: 101 IU/L (ref 39–117)
ALT: 18 IU/L (ref 0–32)
AST: 18 IU/L (ref 0–40)
Albumin: 3.9 g/dL (ref 3.5–5.5)
BILIRUBIN TOTAL: 0.4 mg/dL (ref 0.0–1.2)
BUN/Creatinine Ratio: 11 (ref 9–23)
BUN: 9 mg/dL (ref 6–24)
CHLORIDE: 101 mmol/L (ref 96–106)
CO2: 25 mmol/L (ref 20–29)
Calcium: 9.3 mg/dL (ref 8.7–10.2)
Creatinine, Ser: 0.81 mg/dL (ref 0.57–1.00)
GFR calc Af Amer: 94 mL/min/{1.73_m2} (ref >59)
GFR calc non Af Amer: 81 mL/min/{1.73_m2} (ref >59)
GLOBULIN, TOTAL: 3.1 g/dL (ref 1.5–4.5)
Glucose: 113 mg/dL — ABNORMAL HIGH (ref 65–99)
POTASSIUM: 3.9 mmol/L (ref 3.5–5.2)
SODIUM: 136 mmol/L (ref 134–144)
Total Protein: 7 g/dL (ref 6.0–8.5)

## 2017-03-02 LAB — TSH: TSH: 0.985 u[IU]/mL (ref 0.450–4.500)

## 2017-03-04 ENCOUNTER — Encounter: Payer: Self-pay | Admitting: Unknown Physician Specialty

## 2017-03-04 NOTE — Progress Notes (Signed)
Normal labs.  Pt notified through mychart

## 2017-04-24 ENCOUNTER — Ambulatory Visit (INDEPENDENT_AMBULATORY_CARE_PROVIDER_SITE_OTHER): Payer: BLUE CROSS/BLUE SHIELD | Admitting: Unknown Physician Specialty

## 2017-04-24 ENCOUNTER — Encounter: Payer: Self-pay | Admitting: Unknown Physician Specialty

## 2017-04-24 DIAGNOSIS — L989 Disorder of the skin and subcutaneous tissue, unspecified: Secondary | ICD-10-CM | POA: Diagnosis not present

## 2017-04-24 MED ORDER — SULFAMETHOXAZOLE-TRIMETHOPRIM 800-160 MG PO TABS: 1.0000 | ORAL_TABLET | Freq: Two times a day (BID) | ORAL | 0 refills | Status: DC

## 2017-04-24 NOTE — Assessment & Plan Note (Signed)
Non healing lesion right side of face.  I feel this needs to be treated by dermatology and she will be going next month.  In the meantime, she is insistent about getting an antibiotic or prednisone.  Therefore, I will rx Bactrim BID for 7 days but needs a biopsy

## 2017-04-24 NOTE — Progress Notes (Signed)
BP 108/70   Pulse 87   Temp 98.1 F (36.7 C)   Wt 223 lb 3.2 oz (101.2 kg)   LMP  (LMP Unknown)   SpO2 97%   BMI 33.94 kg/m    Subjective:    Patient ID: Danielle Sanford, female    DOB: 13-Nov-1960, 56 y.o.   MRN: [id number]  HPI: JERMISHA HOFFART is a 56 y.o. female  Chief Complaint  Patient presents with  . 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  It flares up every so often as it's at a place in which she had a biopsy.  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    Relevant past medical, surgical, family and social history reviewed and updated as indicated. Interim medical history since our last visit reviewed. Allergies and medications reviewed and updated.  Review of Systems  Per HPI unless specifically indicated above     Objective:    BP 108/70   Pulse 87   Temp 98.1 F (36.7 C)   Wt 223 lb 3.2 oz (101.2 kg)   LMP  (LMP Unknown)   SpO2 97%   BMI 33.94 kg/m   Wt Readings from Last 3 Encounters:  04/24/17 223 lb 3.2 oz (101.2 kg)  03/01/17 220 lb (99.8 kg)  08/30/16 227 lb 12.8 oz (103.3 kg)    Physical Exam  Constitutional: She is oriented to person, place, and time. She appears well-developed and well-nourished. No distress.  HENT:  Head: Normocephalic and atraumatic.  Eyes: Conjunctivae and lids are normal. Right eye exhibits no discharge. Left eye exhibits no discharge. No scleral icterus.  Cardiovascular: Normal rate.   Pulmonary/Chest: Effort normal.  Abdominal: Normal appearance. There is no splenomegaly or hepatomegaly.  Musculoskeletal: Normal range of motion.  Neurological: She is alert and oriented to person, place, and time.  Skin: Skin is intact.  Pt with scabbed area right side of face slightly larger than pencil eraser.  Much makeup in the area  she is not willing to remove so difficult to fully assess  Psychiatric: She has a normal mood and affect. Her behavior is normal. Judgment and thought content normal.    Results for orders placed or performed in visit on 03/01/17  TSH  Result Value Ref Range   TSH 0.985 0.450 - 4.500 uIU/mL  Comprehensive metabolic panel  Result Value Ref Range   Glucose 113 (H) 65 - 99 mg/dL   BUN 9 6 - 24 mg/dL   Creatinine, Ser 4.09 0.57 - 1.00 mg/dL   GFR calc non Af Amer 81 >59 mL/min/1.73   GFR calc Af Amer 94 >59 mL/min/1.73   BUN/Creatinine Ratio 11 9 - 23   Sodium 136 134 - 144 mmol/L   Potassium 3.9 3.5 - 5.2 mmol/L   Chloride 101 96 - 106 mmol/L   CO2 25 20 - 29 mmol/L   Calcium 9.3 8.7 - 10.2 mg/dL   Total Protein 7.0 6.0 - 8.5 g/dL   Albumin 3.9 3.5 - 5.5 g/dL   Globulin, Total 3.1 1.5 - 4.5 g/dL   Albumin/Globulin Ratio 1.3 1.2 - 2.2   Bilirubin Total 0.4 0.0 - 1.2 mg/dL   Alkaline Phosphatase 101 39 - 117 IU/L  AST 18 0 - 40 IU/L   ALT 18 0 - 32 IU/L      Assessment & Plan:   Problem List Items Addressed This Visit      Unprioritized   Skin lesion of face    Non healing lesion right side of face.  I feel this needs to be treated by dermatology and she will be going next month.  In the meantime, she is insistent about getting an antibiotic or prednisone.  Therefore, I will rx Bactrim BID for 7 days but needs a biopsy          Follow up plan: Return for with dermatology.

## 2017-05-13 DIAGNOSIS — D485 Neoplasm of uncertain behavior of skin: Secondary | ICD-10-CM | POA: Diagnosis not present

## 2017-05-13 DIAGNOSIS — D863 Sarcoidosis of skin: Secondary | ICD-10-CM | POA: Diagnosis not present

## 2017-05-13 DIAGNOSIS — L0109 Other impetigo: Secondary | ICD-10-CM | POA: Diagnosis not present

## 2017-05-16 DIAGNOSIS — Z79899 Other long term (current) drug therapy: Secondary | ICD-10-CM | POA: Diagnosis not present

## 2017-05-23 ENCOUNTER — Telehealth: Payer: Self-pay | Admitting: Unknown Physician Specialty

## 2017-05-23 NOTE — Telephone Encounter (Signed)
Called to find out where pt was going to be working, as she did have the TD not the Tdap (pertussis added to tetanus). Pt did not answer. Left vm for her to return call.

## 2017-05-23 NOTE — Telephone Encounter (Signed)
Spoke with patient. She just needed Td. Record printed and placed at front desk for pickup.

## 2017-05-23 NOTE — Telephone Encounter (Signed)
Patient would like to know if she has had the TDAP vaccine. Looked in her chart and saw that she may have had it on 10/19/2014 and is due 10/19/2024. Wanted to make sure this information was correct first. Patient would like a copy of vaccine for her job if it has been done. If not I will call her back to schedule an appointment.  Please Advise.  Thank you

## 2017-05-24 ENCOUNTER — Telehealth: Payer: Self-pay

## 2017-05-24 NOTE — Telephone Encounter (Signed)
Patient called about a tetanus vaccine. 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 Looked in [facility] and do not see this vaccine within the last 10 years, just a regular TD. Patient scheduled for a nurse visit next Tuesday for TDAP vaccine.

## 2017-05-27 DIAGNOSIS — D863 Sarcoidosis of skin: Secondary | ICD-10-CM | POA: Diagnosis not present

## 2017-05-27 DIAGNOSIS — D485 Neoplasm of uncertain behavior of skin: Secondary | ICD-10-CM | POA: Diagnosis not present

## 2017-05-28 ENCOUNTER — Ambulatory Visit (INDEPENDENT_AMBULATORY_CARE_PROVIDER_SITE_OTHER): Payer: BLUE CROSS/BLUE SHIELD

## 2017-05-28 DIAGNOSIS — Z23 Encounter for immunization: Secondary | ICD-10-CM

## 2017-05-31 ENCOUNTER — Other Ambulatory Visit: Payer: Self-pay | Admitting: Unknown Physician Specialty

## 2017-05-31 DIAGNOSIS — Z1239 Encounter for other screening for malignant neoplasm of breast: Secondary | ICD-10-CM

## 2017-06-02 ENCOUNTER — Other Ambulatory Visit: Payer: Self-pay | Admitting: Unknown Physician Specialty

## 2017-06-02 DIAGNOSIS — E039 Hypothyroidism, unspecified: Secondary | ICD-10-CM

## 2017-06-13 ENCOUNTER — Ambulatory Visit
Admission: RE | Admit: 2017-06-13 | Discharge: 2017-06-13 | Disposition: A | Discharge status: Home / Self Care | Payer: BLUE CROSS/BLUE SHIELD | Source: Ambulatory Visit | Attending: Unknown Physician Specialty | Admitting: Unknown Physician Specialty

## 2017-06-13 DIAGNOSIS — Z1231 Encounter for screening mammogram for malignant neoplasm of breast: Secondary | ICD-10-CM | POA: Insufficient documentation

## 2017-06-13 DIAGNOSIS — Z1239 Encounter for other screening for malignant neoplasm of breast: Secondary | ICD-10-CM

## 2017-07-12 ENCOUNTER — Ambulatory Visit (INDEPENDENT_AMBULATORY_CARE_PROVIDER_SITE_OTHER): Payer: BLUE CROSS/BLUE SHIELD | Admitting: Unknown Physician Specialty

## 2017-07-12 ENCOUNTER — Encounter: Payer: Self-pay | Admitting: Unknown Physician Specialty

## 2017-07-12 VITALS — BP 130/79 | HR 76 | Temp 97.7°F | Wt 216.4 lb

## 2017-07-12 DIAGNOSIS — L0100 Impetigo, unspecified: Secondary | ICD-10-CM | POA: Diagnosis not present

## 2017-07-12 MED ORDER — MUPIROCIN 2 % EX OINT: TOPICAL_OINTMENT | CUTANEOUS | 1 refills | Status: DC | PRN

## 2017-07-12 MED ORDER — SULFAMETHOXAZOLE-TRIMETHOPRIM 800-160 MG PO TABS: 1.0000 | ORAL_TABLET | Freq: Two times a day (BID) | ORAL | 0 refills | Status: DC

## 2017-07-12 NOTE — Progress Notes (Signed)
BP 130/79   Pulse 76   Temp 97.7 F (36.5 C)   Wt 216 lb 6.4 oz (98.2 kg)   LMP  (LMP Unknown)   SpO2 96%   BMI 32.90 kg/m    Subjective:    Patient ID: Danielle Sanford, female    DOB: December 15, 1960, 56 y.o.   MRN: [id number]  HPI: Danielle Sanford is a 56 y.o. female  Chief Complaint  Patient presents with  . 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  She has multiple skin lesions.  One is below left ear and is now open, draining, and getting red and irritated.    Relevant past medical, surgical, family and social history reviewed and updated as indicated. Interim medical history since our last visit reviewed. Allergies and medications reviewed and updated.  Review of Systems  Per HPI unless specifically indicated above     Objective:    BP 130/79   Pulse 76   Temp 97.7 F (36.5 C)   Wt 216 lb 6.4 oz (98.2 kg)   LMP  (LMP Unknown)   SpO2 96%   BMI 32.90 kg/m   Wt Readings from Last 3 Encounters:  07/12/17 216 lb 6.4 oz (98.2 kg)  04/24/17 223 lb 3.2 oz (101.2 kg)  03/01/17 220 lb (99.8 kg)    Physical Exam  Constitutional: She is oriented to person, place, and time. She appears well-developed and well-nourished. No distress.  HENT:  Head: Normocephalic and atraumatic.  Eyes: Conjunctivae and lids are normal. Right eye exhibits no discharge. Left eye exhibits no discharge. No scleral icterus.  Cardiovascular: Normal rate.   Pulmonary/Chest: Effort normal.  Abdominal: Normal appearance. There is no splenomegaly or hepatomegaly.  Musculoskeletal: Normal range of motion.  Neurological: She is alert and oriented to person, place, and time.  Skin: Skin is intact. Rash noted.  Lesion below ear dime size with some surrounding erythema  Psychiatric: She has a normal mood and affect. Her behavior is normal. Judgment and thought content  normal.    Results for orders placed or performed in visit on 03/01/17  TSH  Result Value Ref Range   TSH 0.985 0.450 - 4.500 uIU/mL  Comprehensive metabolic panel  Result Value Ref Range   Glucose 113 (H) 65 - 99 mg/dL   BUN 9 6 - 24 mg/dL   Creatinine, Ser 7.820.81 0.57 - 1.00 mg/dL   GFR calc non Af Amer 81 >59 mL/min/1.73   GFR calc Af Amer 94 >59 mL/min/1.73   BUN/Creatinine Ratio 11 9 - 23   Sodium 136 134 - 144 mmol/L   Potassium 3.9 3.5 - 5.2 mmol/L   Chloride 101 96 - 106 mmol/L   CO2 25 20 - 29 mmol/L   Calcium 9.3 8.7 - 10.2 mg/dL   Total Protein 7.0 6.0 - 8.5 g/dL   Albumin 3.9 3.5 - 5.5 g/dL   Globulin, Total 3.1 1.5 - 4.5 g/dL   Albumin/Globulin Ratio 1.3 1.2 - 2.2   Bilirubin Total 0.4 0.0 - 1.2 mg/dL   Alkaline Phosphatase 101 39 - 117 IU/L   AST 18 0 - 40 IU/L   ALT 18 0 - 32 IU/L      Assessment & Plan:   Problem List Items Addressed This Visit    None  Visit Diagnoses    Impetigo    -  Primary   Rx for Bactrim which helped last time.  Rx for Bactroban and encouraged to use as soon as noting an infected lesion   Relevant Medications   sulfamethoxazole-trimethoprim (BACTRIM DS,SEPTRA DS) 800-160 MG tablet   mupirocin ointment (BACTROBAN) 2 %       Follow up plan: Return if symptoms worsen or fail to improve.

## 2017-08-11 ENCOUNTER — Other Ambulatory Visit: Payer: Self-pay | Admitting: Unknown Physician Specialty

## 2017-08-11 DIAGNOSIS — E039 Hypothyroidism, unspecified: Secondary | ICD-10-CM

## 2017-09-03 ENCOUNTER — Telehealth: Payer: Self-pay | Admitting: Unknown Physician Specialty

## 2017-09-03 ENCOUNTER — Encounter: Payer: BLUE CROSS/BLUE SHIELD | Admitting: Unknown Physician Specialty

## 2017-09-03 NOTE — Telephone Encounter (Signed)
Please call to reschedule patient's missed appointment today. Thanks.

## 2017-09-03 NOTE — Telephone Encounter (Signed)
No show.  Can we call to reschedule?

## 2017-09-11 ENCOUNTER — Ambulatory Visit (INDEPENDENT_AMBULATORY_CARE_PROVIDER_SITE_OTHER): Payer: BLUE CROSS/BLUE SHIELD | Admitting: Unknown Physician Specialty

## 2017-09-11 ENCOUNTER — Encounter: Payer: Self-pay | Admitting: Unknown Physician Specialty

## 2017-09-11 VITALS — BP 134/71 | HR 78 | Ht 68.0 in | Wt 215.0 lb

## 2017-09-11 DIAGNOSIS — Z0000 Encounter for general adult medical examination without abnormal findings: Secondary | ICD-10-CM | POA: Diagnosis not present

## 2017-09-11 DIAGNOSIS — I10 Essential (primary) hypertension: Secondary | ICD-10-CM | POA: Diagnosis not present

## 2017-09-11 DIAGNOSIS — E782 Mixed hyperlipidemia: Secondary | ICD-10-CM

## 2017-09-11 DIAGNOSIS — E039 Hypothyroidism, unspecified: Secondary | ICD-10-CM

## 2017-09-11 DIAGNOSIS — J0101 Acute recurrent maxillary sinusitis: Secondary | ICD-10-CM | POA: Diagnosis not present

## 2017-09-11 LAB — URINALYSIS, ROUTINE W REFLEX MICROSCOPIC
Bilirubin, UA: NEGATIVE
Glucose, UA: NEGATIVE
KETONES UA: NEGATIVE
Leukocytes, UA: NEGATIVE
NITRITE UA: NEGATIVE
PH UA: 7 (ref 5.0–7.5)
Protein, UA: NEGATIVE
RBC, UA: NEGATIVE
Specific Gravity, UA: 1.01 (ref 1.005–1.030)
UUROB: 0.2 mg/dL (ref 0.2–1.0)

## 2017-09-11 MED ORDER — SULFAMETHOXAZOLE-TRIMETHOPRIM 800-160 MG PO TABS: 1.0000 | ORAL_TABLET | Freq: Two times a day (BID) | ORAL | 0 refills | Status: DC

## 2017-09-11 MED ORDER — ATORVASTATIN CALCIUM 20 MG PO TABS: 20.0000 mg | ORAL_TABLET | Freq: Every day | ORAL | 3 refills | Status: DC

## 2017-09-11 NOTE — Assessment & Plan Note (Signed)
Check LDL. 

## 2017-09-11 NOTE — Progress Notes (Signed)
BP 134/71   Pulse 78   Ht 5\' 8"  (1.727 m)   Wt 215 lb (97.5 kg)   LMP  (LMP Unknown)   SpO2 94%   BMI 32.69 kg/m    Subjective:    Patient ID: Danielle Sanford, female    DOB: 1961-03-30, 56 y.o.   MRN: [id number]  HPI: Danielle Sanford is a 56 y.o. female  Chief Complaint  Patient presents with  . Annual Exam   URI   This is a new (States she got antibiotics for her skin but it also helped her sinuses.  She has been to ENT before.  Closed up on right side and not interested in returning.) problem. Episode onset: 1 week. The problem has been gradually improving. There has been no fever. Associated symptoms include congestion, coughing, rhinorrhea and sneezing. Pertinent negatives include no abdominal pain, chest pain, diarrhea, dysuria, ear pain, headaches, joint pain, joint swelling, nausea, neck pain, plugged ear sensation, rash, sinus pain, sore throat, swollen glands, vomiting or wheezing. Treatments tried: Takes Flonase every AM. The treatment provided no relief.   Hypothyroid No change in energy or weight  Hypertension Using medications without difficulty Average home BPs   No problems or lightheadedness No chest pain with exertion or shortness of breath No Edema   Relevant past medical, surgical, family and social history reviewed and updated as indicated. Interim medical history since our last visit reviewed. Allergies and medications reviewed and updated.  Review of Systems  Constitutional: Negative.   HENT: Positive for congestion, rhinorrhea and sneezing. Negative for ear pain, sinus pain and sore throat.   Eyes: Negative.   Respiratory: Positive for cough. Negative for wheezing.   Cardiovascular: Negative for chest pain.  Gastrointestinal: Negative for abdominal pain, diarrhea, nausea and vomiting.  Genitourinary: Negative for dysuria.  Musculoskeletal: Negative for joint pain and neck pain.  Skin: Negative for rash.  Neurological: Negative for  headaches.  Psychiatric/Behavioral: Negative.     Per HPI unless specifically indicated above     Objective:    BP 134/71   Pulse 78   Ht 5\' 8"  (1.727 m)   Wt 215 lb (97.5 kg)   LMP  (LMP Unknown)   SpO2 94%   BMI 32.69 kg/m   Wt Readings from Last 3 Encounters:  09/11/17 215 lb (97.5 kg)  07/12/17 216 lb 6.4 oz (98.2 kg)  04/24/17 223 lb 3.2 oz (101.2 kg)    Physical Exam  Constitutional: She is oriented to person, place, and time. She appears well-developed and well-nourished. No distress.  HENT:  Head: Normocephalic and atraumatic.  Nose: Nasal deformity present. Right sinus exhibits no maxillary sinus tenderness and no frontal sinus tenderness. Left sinus exhibits no maxillary sinus tenderness and no frontal sinus tenderness.  Purulent drainage bilaterally  Eyes: Conjunctivae and lids are normal. Right eye exhibits no discharge. Left eye exhibits no discharge. No scleral icterus.  Neck: Normal range of motion. Neck supple. No JVD present. Carotid bruit is not present.  Cardiovascular: Normal rate, regular rhythm and normal heart sounds.  Pulmonary/Chest: Effort normal. She has wheezes.  Abdominal: Soft. Normal appearance and bowel sounds are normal. She exhibits no distension. There is no splenomegaly or hepatomegaly. There is no tenderness. There is no rebound.  Musculoskeletal: Normal range of motion.  Neurological: She is alert and oriented to person, place, and time.  Skin: Skin is warm, dry and intact. No rash noted. No pallor.  Psychiatric: She has a normal mood  and affect. Her behavior is normal. Judgment and thought content normal.    Results for orders placed or performed in visit on 03/01/17  TSH  Result Value Ref Range   TSH 0.985 0.450 - 4.500 uIU/mL  Comprehensive metabolic panel  Result Value Ref Range   Glucose 113 (H) 65 - 99 mg/dL   BUN 9 6 - 24 mg/dL   Creatinine, Ser 1.610.81 0.57 - 1.00 mg/dL   GFR calc non Af Amer 81 >59 mL/min/1.73   GFR calc Af  Amer 94 >59 mL/min/1.73   BUN/Creatinine Ratio 11 9 - 23   Sodium 136 134 - 144 mmol/L   Potassium 3.9 3.5 - 5.2 mmol/L   Chloride 101 96 - 106 mmol/L   CO2 25 20 - 29 mmol/L   Calcium 9.3 8.7 - 10.2 mg/dL   Total Protein 7.0 6.0 - 8.5 g/dL   Albumin 3.9 3.5 - 5.5 g/dL   Globulin, Total 3.1 1.5 - 4.5 g/dL   Albumin/Globulin Ratio 1.3 1.2 - 2.2   Bilirubin Total 0.4 0.0 - 1.2 mg/dL   Alkaline Phosphatase 101 39 - 117 IU/L   AST 18 0 - 40 IU/L   ALT 18 0 - 32 IU/L      Assessment & Plan:   Problem List Items Addressed This Visit      Unprioritized   Hyperlipidemia    Check LDL      Relevant Medications   atorvastatin (LIPITOR) 20 MG tablet   Other Relevant Orders   CBC with Differential/Platelet   Comprehensive metabolic panel   Lipid panel   Urinalysis, Routine w reflex microscopic   Hypertension - Primary    Stable, continue present medications.        Relevant Medications   atorvastatin (LIPITOR) 20 MG tablet   Other Relevant Orders   CBC with Differential/Platelet   Comprehensive metabolic panel   Urinalysis, Routine w reflex microscopic   Hypothyroidism    Check TSH today      Relevant Orders   TSH    Other Visit Diagnoses    Acute recurrent maxillary sinusitis       Rx for Septra as it helped in the past.  Will monitor if continues to be recurrent   Relevant Medications   sulfamethoxazole-trimethoprim (BACTRIM DS,SEPTRA DS) 800-160 MG tablet   Annual physical exam           Follow up plan: Return in about 6 months (around 03/12/2018).

## 2017-09-11 NOTE — Assessment & Plan Note (Signed)
Stable, continue present medications.   

## 2017-09-11 NOTE — Assessment & Plan Note (Signed)
Check TSH today

## 2017-09-12 LAB — CBC WITH DIFFERENTIAL/PLATELET
BASOS ABS: 0 10*3/uL (ref 0.0–0.2)
BASOS: 0 %
EOS (ABSOLUTE): 0.2 10*3/uL (ref 0.0–0.4)
Eos: 5 %
Hematocrit: 37.7 % (ref 34.0–46.6)
Hemoglobin: 12.7 g/dL (ref 11.1–15.9)
Immature Grans (Abs): 0 10*3/uL (ref 0.0–0.1)
Immature Granulocytes: 0 %
LYMPHS: 12 %
Lymphocytes Absolute: 0.4 10*3/uL — ABNORMAL LOW (ref 0.7–3.1)
MCH: 26.5 pg — AB (ref 26.6–33.0)
MCHC: 33.7 g/dL (ref 31.5–35.7)
MCV: 79 fL (ref 79–97)
MONOS ABS: 0.4 10*3/uL (ref 0.1–0.9)
Monocytes: 12 %
NEUTROS ABS: 2.7 10*3/uL (ref 1.4–7.0)
Neutrophils: 71 %
PLATELETS: 184 10*3/uL (ref 150–379)
RBC: 4.8 x10E6/uL (ref 3.77–5.28)
RDW: 15.5 % — AB (ref 12.3–15.4)
WBC: 3.8 10*3/uL (ref 3.4–10.8)

## 2017-09-12 LAB — COMPREHENSIVE METABOLIC PANEL
A/G RATIO: 1.3 (ref 1.2–2.2)
ALK PHOS: 103 IU/L (ref 39–117)
ALT: 20 IU/L (ref 0–32)
AST: 19 IU/L (ref 0–40)
Albumin: 3.9 g/dL (ref 3.5–5.5)
BILIRUBIN TOTAL: 0.4 mg/dL (ref 0.0–1.2)
BUN/Creatinine Ratio: 8 — ABNORMAL LOW (ref 9–23)
BUN: 6 mg/dL (ref 6–24)
CHLORIDE: 100 mmol/L (ref 96–106)
CO2: 25 mmol/L (ref 20–29)
Calcium: 9.1 mg/dL (ref 8.7–10.2)
Creatinine, Ser: 0.71 mg/dL (ref 0.57–1.00)
GFR calc non Af Amer: 95 mL/min/{1.73_m2} (ref >59)
GFR, EST AFRICAN AMERICAN: 109 mL/min/{1.73_m2} (ref >59)
GLUCOSE: 83 mg/dL (ref 65–99)
Globulin, Total: 3.1 g/dL (ref 1.5–4.5)
POTASSIUM: 3.8 mmol/L (ref 3.5–5.2)
Sodium: 140 mmol/L (ref 134–144)
Total Protein: 7 g/dL (ref 6.0–8.5)

## 2017-09-12 LAB — TSH: TSH: 0.144 u[IU]/mL — AB (ref 0.450–4.500)

## 2017-09-12 LAB — LIPID PANEL
CHOLESTEROL TOTAL: 115 mg/dL (ref 100–199)
Chol/HDL Ratio: 4.3 ratio (ref 0.0–4.4)
HDL: 27 mg/dL — ABNORMAL LOW (ref >39)
LDL Calculated: 55 mg/dL (ref 0–99)
TRIGLYCERIDES: 167 mg/dL — AB (ref 0–149)
VLDL Cholesterol Cal: 33 mg/dL (ref 5–40)

## 2017-09-13 ENCOUNTER — Encounter: Payer: Self-pay | Admitting: Unknown Physician Specialty

## 2017-09-13 MED ORDER — LEVOTHYROXINE SODIUM 175 MCG PO TABS: 175.0000 ug | ORAL_TABLET | Freq: Every day | ORAL | 0 refills | Status: DC

## 2017-11-20 ENCOUNTER — Other Ambulatory Visit: Payer: Self-pay | Admitting: Unknown Physician Specialty

## 2017-11-28 DIAGNOSIS — Z79899 Other long term (current) drug therapy: Secondary | ICD-10-CM | POA: Diagnosis not present

## 2017-11-29 DIAGNOSIS — D863 Sarcoidosis of skin: Secondary | ICD-10-CM | POA: Diagnosis not present

## 2018-03-25 ENCOUNTER — Other Ambulatory Visit: Payer: Self-pay | Admitting: Family Medicine

## 2018-03-26 NOTE — Telephone Encounter (Signed)
levothyroxine refill Last Refill:11/20/17 # 90 Last OV: 09/11/17 PCP: Gabriel Cirriheryl Wicker NP Pharmacy:[facility] Last TSH 0.144 on 09/11/18

## 2018-06-14 ENCOUNTER — Other Ambulatory Visit: Payer: Self-pay | Admitting: Unknown Physician Specialty

## 2018-06-16 NOTE — Telephone Encounter (Signed)
L-Thyroxine refill Last Refill: 03/26/18, # 90; no refill Last OV: 09/11/17 PCP: Gabriel Cirri Pharmacy:[facility]  Was advised to f/u in 6 mos.; has not established with another PCP; please review/advise

## 2018-07-18 ENCOUNTER — Other Ambulatory Visit: Payer: Self-pay | Admitting: Family Medicine

## 2018-07-18 NOTE — Telephone Encounter (Signed)
Routing to close.  °

## 2018-07-18 NOTE — Telephone Encounter (Signed)
Requested medication (s) are due for refill today: Yes  Requested medication (s) are on the active medication list: Yes  Last refill:  11/20/17  Future visit scheduled: No  Notes to clinic:  Unable to refill, appointment needed.     Requested Prescriptions  Pending Prescriptions Disp Refills   propranolol (INDERAL) 10 MG tablet [Pharmacy Med Name: PROPRANOLOL HCL TABS 10MG ] 90 tablet 12    Sig: TAKE 1 TABLET THREE TIMES A DAY     Cardiovascular:  Beta Blockers Failed - 07/18/2018 12:47 PM      Failed - Valid encounter within last 6 months    Recent Outpatient Visits          10 months ago Essential hypertension   [facility] Gabriel Cirri, NP   1 year ago Impetigo   [facility] Gabriel Cirri, NP   1 year ago Skin lesion of face   [facility] Gabriel Cirri, NP   1 year ago Mixed hyperlipidemia   [facility] Gabriel Cirri, NP   1 year ago Need for influenza vaccination   [facility] Gabriel Cirri, NP             Passed - Last BP in normal range    BP Readings from Last 1 Encounters:  09/11/17 134/71         Passed - Last Heart Rate in normal range    Pulse Readings from Last 1 Encounters:  09/11/17 78

## 2018-07-18 NOTE — Telephone Encounter (Signed)
Patient called, left VM to return call to the office to schedule yearly physical with another provider.

## 2018-07-18 NOTE — Telephone Encounter (Signed)
Please advise 

## 2018-07-18 NOTE — Telephone Encounter (Signed)
Refill request approved

## 2018-09-02 ENCOUNTER — Encounter: Payer: Self-pay | Admitting: Nurse Practitioner

## 2018-09-02 ENCOUNTER — Ambulatory Visit (INDEPENDENT_AMBULATORY_CARE_PROVIDER_SITE_OTHER): Payer: BLUE CROSS/BLUE SHIELD | Admitting: Nurse Practitioner

## 2018-09-02 VITALS — BP 108/73 | HR 71 | Temp 98.1°F | Ht 68.1 in | Wt 213.4 lb

## 2018-09-02 DIAGNOSIS — E782 Mixed hyperlipidemia: Secondary | ICD-10-CM | POA: Diagnosis not present

## 2018-09-02 DIAGNOSIS — L2089 Other atopic dermatitis: Secondary | ICD-10-CM | POA: Diagnosis not present

## 2018-09-02 DIAGNOSIS — I10 Essential (primary) hypertension: Secondary | ICD-10-CM

## 2018-09-02 DIAGNOSIS — E039 Hypothyroidism, unspecified: Secondary | ICD-10-CM | POA: Diagnosis not present

## 2018-09-02 DIAGNOSIS — Z23 Encounter for immunization: Secondary | ICD-10-CM

## 2018-09-02 DIAGNOSIS — L209 Atopic dermatitis, unspecified: Secondary | ICD-10-CM | POA: Insufficient documentation

## 2018-09-02 MED ORDER — TRIAMCINOLONE ACETONIDE 0.025 % EX CREA: 1.0000 "application " | TOPICAL_CREAM | Freq: Two times a day (BID) | CUTANEOUS | 0 refills | Status: DC | PRN

## 2018-09-02 NOTE — Assessment & Plan Note (Signed)
Check TSH today and adjust dose as needed. 

## 2018-09-02 NOTE — Patient Instructions (Signed)
Hypothyroidism Hypothyroidism is a disorder of the thyroid. The thyroid is a large gland that is located in the lower front of the neck. The thyroid releases hormones that control how the body works. With hypothyroidism, the thyroid does not make enough of these hormones. What are the causes? Causes of hypothyroidism may include:  Viral infections.  Pregnancy.  Your own defense system (immune system) attacking your thyroid.  Certain medicines.  Birth defects.  Past radiation treatments to your head or neck.  Past treatment with radioactive iodine.  Past surgical removal of part or all of your thyroid.  Problems with the gland that is located in the center of your brain (pituitary).  What are the signs or symptoms? Signs and symptoms of hypothyroidism may include:  Feeling as though you have no energy (lethargy).  Inability to tolerate cold.  Weight gain that is not explained by a change in diet or exercise habits.  Dry skin.  Coarse hair.  Menstrual irregularity.  Slowing of thought processes.  Constipation.  Sadness or depression.  How is this diagnosed? Your health care provider may diagnose hypothyroidism with blood tests and ultrasound tests. How is this treated? Hypothyroidism is treated with medicine that replaces the hormones that your body does not make. After you begin treatment, it may take several weeks for symptoms to go away. Follow these instructions at home:  Take medicines only as directed by your health care provider.  If you start taking any new medicines, tell your health care provider.  Keep all follow-up visits as directed by your health care provider. This is important. As your condition improves, your dosage needs may change. You will need to have blood tests regularly so that your health care provider can watch your condition. Contact a health care provider if:  Your symptoms do not get better with treatment.  You are taking thyroid  replacement medicine and: ? You sweat excessively. ? You have tremors. ? You feel anxious. ? You lose weight rapidly. ? You cannot tolerate heat. ? You have emotional swings. ? You have diarrhea. ? You feel weak. Get help right away if:  You develop chest pain.  You develop an irregular heartbeat.  You develop a rapid heartbeat. This information is not intended to replace advice given to you by your health care provider. Make sure you discuss any questions you have with your health care provider. Document Released: 09/03/2005 Document Revised: 02/09/2016 Document Reviewed: 01/19/2014 Elsevier Interactive Patient Education  2018 [employer]  

## 2018-09-02 NOTE — Assessment & Plan Note (Addendum)
Chronic, stable.  Continue current medication regimen.  CBC and CMP today.

## 2018-09-02 NOTE — Progress Notes (Signed)
BP 108/73 (BP Location: Left Arm, Patient Position: Sitting, Cuff Size: Normal)   Pulse 71   Temp 98.1 F (36.7 C)   Ht 5' 8.1" (1.73 m)   Wt 213 lb 7 oz (96.8 kg)   LMP  (LMP Unknown)   SpO2 98%   BMI 32.36 kg/m    Subjective:    Patient ID: Danielle Sanford, female    DOB: 11/15/1960, 57 y.o.   MRN: [id number]  HPI: Danielle Sanford is a 57 y.o. female presents for 6 month follow-up  Chief Complaint  Patient presents with  . Hyperlipidemia  . Hypothyroidism   HYPERTENSION / HYPERLIPIDEMIA She continues on Atorvastatin without ADR.  Propranolol and Plaquenil daily. Satisfied with current treatment? yes Duration of hypertension: chronic BP monitoring frequency: not checking BP range:  BP medication side effects: no Duration of hyperlipidemia: chronic Cholesterol medication side effects: no Cholesterol supplements: none Medication compliance: good compliance Aspirin: no Recent stressors: no Recurrent headaches: no Visual changes: no Palpitations: no Dyspnea: no Chest pain: no Lower extremity edema: no Dizzy/lightheaded: no  HYPOTHYROIDISM Continues on Levothyroxine 175 MCG a day.  TSH in December 2018 was 0.144 and reduction of medication was done.   Thyroid control status:stable Satisfied with current treatment? yes Medication side effects: no Medication compliance: good compliance Etiology of hypothyroidism:  Recent dose adjustment:no Fatigue: no Cold intolerance: no Heat intolerance: no Weight gain: no Weight loss: no Constipation: no Diarrhea/loose stools: no Palpitations: no Lower extremity edema: no Anxiety/depressed mood: no  ATOPIC DERMATITIS BILATERAL HANDS: Noted to bilateral hands during exam.  Pt frequently washing hands and reports have been dry for years.  Currently not using lotion regularly at home.  Relevant past medical, surgical, family and social history reviewed and updated as indicated. Interim medical history since our last  visit reviewed. Allergies and medications reviewed and updated.  Review of Systems  Constitutional: Negative for activity change, appetite change, fatigue and fever.  Respiratory: Negative for cough, chest tightness, shortness of breath and wheezing.   Cardiovascular: Negative for chest pain, palpitations and leg swelling.  Gastrointestinal: Negative for abdominal distention, abdominal pain, constipation, diarrhea, nausea and vomiting.  Endocrine: Negative for cold intolerance, heat intolerance, polydipsia, polyphagia and polyuria.  Musculoskeletal: Negative.   Neurological: Negative for dizziness, syncope, weakness, light-headedness, numbness and headaches.  Psychiatric/Behavioral: Negative.     Per HPI unless specifically indicated above     Objective:    BP 108/73 (BP Location: Left Arm, Patient Position: Sitting, Cuff Size: Normal)   Pulse 71   Temp 98.1 F (36.7 C)   Ht 5' 8.1" (1.73 m)   Wt 213 lb 7 oz (96.8 kg)   LMP  (LMP Unknown)   SpO2 98%   BMI 32.36 kg/m   Wt Readings from Last 3 Encounters:  09/02/18 213 lb 7 oz (96.8 kg)  09/11/17 215 lb (97.5 kg)  07/12/17 216 lb 6.4 oz (98.2 kg)    Physical Exam Vitals signs and nursing note reviewed.  Constitutional:      General: She is awake.     Appearance: Normal appearance. She is well-developed.  HENT:     Head: Normocephalic.  Eyes:     General:        Right eye: No discharge.        Left eye: No discharge.     Conjunctiva/sclera: Conjunctivae normal.     Pupils: Pupils are equal, round, and reactive to light.  Neck:     Musculoskeletal: Normal  range of motion and neck supple.     Thyroid: No thyromegaly.     Vascular: No carotid bruit or JVD.  Cardiovascular:     Rate and Rhythm: Normal rate and regular rhythm.     Heart sounds: Normal heart sounds.  Pulmonary:     Effort: Pulmonary effort is normal.     Breath sounds: Normal breath sounds.  Abdominal:     General: Bowel sounds are normal.      Palpations: Abdomen is soft.  Lymphadenopathy:     Cervical: No cervical adenopathy.  Skin:    General: Skin is warm and dry.     Comments: Bilateral hands with xerosis to palms and knuckles.  Knuckles with dry, mild erythema, and cracking.    Neurological:     Mental Status: She is alert and oriented to person, place, and time.  Psychiatric:        Behavior: Behavior normal. Behavior is cooperative.        Thought Content: Thought content normal.        Judgment: Judgment normal.     Results for orders placed or performed in visit on 09/11/17  CBC with Differential/Platelet  Result Value Ref Range   WBC 3.8 3.4 - 10.8 x10E3/uL   RBC 4.80 3.77 - 5.28 x10E6/uL   Hemoglobin 12.7 11.1 - 15.9 g/dL   Hematocrit 16.1 09.6 - 46.6 %   MCV 79 79 - 97 fL   MCH 26.5 (L) 26.6 - 33.0 pg   MCHC 33.7 31.5 - 35.7 g/dL   RDW 04.5 (H) 40.9 - 81.1 %   Platelets 184 150 - 379 x10E3/uL   Neutrophils 71 Not Estab. %   Lymphs 12 Not Estab. %   Monocytes 12 Not Estab. %   Eos 5 Not Estab. %   Basos 0 Not Estab. %   Neutrophils Absolute 2.7 1.4 - 7.0 x10E3/uL   Lymphocytes Absolute 0.4 (L) 0.7 - 3.1 x10E3/uL   Monocytes Absolute 0.4 0.1 - 0.9 x10E3/uL   EOS (ABSOLUTE) 0.2 0.0 - 0.4 x10E3/uL   Basophils Absolute 0.0 0.0 - 0.2 x10E3/uL   Immature Granulocytes 0 Not Estab. %   Immature Grans (Abs) 0.0 0.0 - 0.1 x10E3/uL  Comprehensive metabolic panel  Result Value Ref Range   Glucose 83 65 - 99 mg/dL   BUN 6 6 - 24 mg/dL   Creatinine, Ser 9.14 0.57 - 1.00 mg/dL   GFR calc non Af Amer 95 >59 mL/min/1.73   GFR calc Af Amer 109 >59 mL/min/1.73   BUN/Creatinine Ratio 8 (L) 9 - 23   Sodium 140 134 - 144 mmol/L   Potassium 3.8 3.5 - 5.2 mmol/L   Chloride 100 96 - 106 mmol/L   CO2 25 20 - 29 mmol/L   Calcium 9.1 8.7 - 10.2 mg/dL   Total Protein 7.0 6.0 - 8.5 g/dL   Albumin 3.9 3.5 - 5.5 g/dL   Globulin, Total 3.1 1.5 - 4.5 g/dL   Albumin/Globulin Ratio 1.3 1.2 - 2.2   Bilirubin Total 0.4 0.0 -  1.2 mg/dL   Alkaline Phosphatase 103 39 - 117 IU/L   AST 19 0 - 40 IU/L   ALT 20 0 - 32 IU/L  Lipid panel  Result Value Ref Range   Cholesterol, Total 115 100 - 199 mg/dL   Triglycerides 782 (H) 0 - 149 mg/dL   HDL 27 (L) >95 mg/dL   VLDL Cholesterol Cal 33 5 - 40 mg/dL   LDL Calculated 55  0 - 99 mg/dL   Chol/HDL Ratio 4.3 0.0 - 4.4 ratio  TSH  Result Value Ref Range   TSH 0.144 (L) 0.450 - 4.500 uIU/mL  Urinalysis, Routine w reflex microscopic  Result Value Ref Range   Specific Gravity, UA 1.010 1.005 - 1.030   pH, UA 7.0 5.0 - 7.5   Color, UA Yellow Yellow   Appearance Ur Clear Clear   Leukocytes, UA Negative Negative   Protein, UA Negative Negative/Trace   Glucose, UA Negative Negative   Ketones, UA Negative Negative   RBC, UA Negative Negative   Bilirubin, UA Negative Negative   Urobilinogen, Ur 0.2 0.2 - 1.0 mg/dL   Nitrite, UA Negative Negative      Assessment & Plan:   Problem List Items Addressed This Visit      Cardiovascular and Mediastinum   Hypertension    Chronic, stable.  Continue current medication regimen.  CBC and CMP today.      Relevant Orders   CBC with Differential/Platelet     Endocrine   Hypothyroidism    Check TSH today and adjust dose as needed.      Relevant Orders   Thyroid Panel With TSH     Musculoskeletal and Integument   Atopic dermatitis    Triamcinolone 0.025% cream as needed to bilateral hands.        Other   Hyperlipidemia - Primary    Chronic, continue statin.   CMP and lipid panel today.      Relevant Orders   Comprehensive metabolic panel   Lipid Panel w/o Chol/HDL Ratio    Other Visit Diagnoses    Immunization due       Relevant Orders   Flu Vaccine QUAD 6+ mos PF IM (Fluarix Quad PF) (Completed)       Follow up plan: Return in about 6 months (around 03/04/2019) for HTN/HLD, hypothyroidism.

## 2018-09-02 NOTE — Assessment & Plan Note (Signed)
Triamcinolone 0.025% cream as needed to bilateral hands.

## 2018-09-02 NOTE — Assessment & Plan Note (Signed)
Chronic, continue statin.   CMP and lipid panel today.

## 2018-09-03 LAB — COMPREHENSIVE METABOLIC PANEL
A/G RATIO: 1.4 (ref 1.2–2.2)
ALBUMIN: 4 g/dL (ref 3.5–5.5)
ALT: 19 IU/L (ref 0–32)
AST: 18 IU/L (ref 0–40)
Alkaline Phosphatase: 105 IU/L (ref 39–117)
BILIRUBIN TOTAL: 0.5 mg/dL (ref 0.0–1.2)
BUN / CREAT RATIO: 15 (ref 9–23)
BUN: 11 mg/dL (ref 6–24)
CHLORIDE: 101 mmol/L (ref 96–106)
CO2: 25 mmol/L (ref 20–29)
Calcium: 9.3 mg/dL (ref 8.7–10.2)
Creatinine, Ser: 0.75 mg/dL (ref 0.57–1.00)
GFR, EST AFRICAN AMERICAN: 102 mL/min/{1.73_m2} (ref >59)
GFR, EST NON AFRICAN AMERICAN: 88 mL/min/{1.73_m2} (ref >59)
Globulin, Total: 2.8 g/dL (ref 1.5–4.5)
Glucose: 90 mg/dL (ref 65–99)
POTASSIUM: 4.3 mmol/L (ref 3.5–5.2)
Sodium: 138 mmol/L (ref 134–144)
TOTAL PROTEIN: 6.8 g/dL (ref 6.0–8.5)

## 2018-09-03 LAB — THYROID PANEL WITH TSH
Free Thyroxine Index: 3.3 (ref 1.2–4.9)
T3 UPTAKE RATIO: 30 % (ref 24–39)
T4 TOTAL: 11.1 ug/dL (ref 4.5–12.0)
TSH: 0.88 u[IU]/mL (ref 0.450–4.500)

## 2018-09-03 LAB — CBC WITH DIFFERENTIAL/PLATELET
BASOS: 1 %
Basophils Absolute: 0 10*3/uL (ref 0.0–0.2)
EOS (ABSOLUTE): 0.2 10*3/uL (ref 0.0–0.4)
EOS: 4 %
HEMATOCRIT: 38.2 % (ref 34.0–46.6)
HEMOGLOBIN: 12.6 g/dL (ref 11.1–15.9)
IMMATURE GRANS (ABS): 0 10*3/uL (ref 0.0–0.1)
Immature Granulocytes: 0 %
LYMPHS: 11 %
Lymphocytes Absolute: 0.5 10*3/uL — ABNORMAL LOW (ref 0.7–3.1)
MCH: 25.9 pg — AB (ref 26.6–33.0)
MCHC: 33 g/dL (ref 31.5–35.7)
MCV: 78 fL — AB (ref 79–97)
MONOCYTES: 10 %
Monocytes Absolute: 0.4 10*3/uL (ref 0.1–0.9)
NEUTROS ABS: 3.1 10*3/uL (ref 1.4–7.0)
Neutrophils: 74 %
Platelets: 140 10*3/uL — ABNORMAL LOW (ref 150–450)
RBC: 4.87 x10E6/uL (ref 3.77–5.28)
RDW: 14.6 % (ref 12.3–15.4)
WBC: 4.2 10*3/uL (ref 3.4–10.8)

## 2018-09-03 LAB — LIPID PANEL W/O CHOL/HDL RATIO
Cholesterol, Total: 123 mg/dL (ref 100–199)
HDL: 32 mg/dL — ABNORMAL LOW (ref >39)
LDL Calculated: 62 mg/dL (ref 0–99)
Triglycerides: 147 mg/dL (ref 0–149)
VLDL Cholesterol Cal: 29 mg/dL (ref 5–40)

## 2018-09-12 ENCOUNTER — Other Ambulatory Visit: Payer: Self-pay | Admitting: Nurse Practitioner

## 2018-09-12 ENCOUNTER — Telehealth: Payer: Self-pay

## 2018-09-12 MED ORDER — LEVOTHYROXINE SODIUM 175 MCG PO TABS: 175.0000 ug | ORAL_TABLET | Freq: Every day | ORAL | 4 refills | Status: DC

## 2018-09-12 NOTE — Telephone Encounter (Signed)
Done

## 2018-09-12 NOTE — Progress Notes (Signed)
Refill request for Levothyroxine filled.

## 2018-09-12 NOTE — Telephone Encounter (Signed)
L-Thyroxine 90 with 4 refills

## 2018-10-07 DIAGNOSIS — H02054 Trichiasis without entropian left upper eyelid: Secondary | ICD-10-CM | POA: Diagnosis not present

## 2018-10-17 DIAGNOSIS — D863 Sarcoidosis of skin: Secondary | ICD-10-CM | POA: Diagnosis not present

## 2018-10-17 DIAGNOSIS — L820 Inflamed seborrheic keratosis: Secondary | ICD-10-CM | POA: Diagnosis not present

## 2018-10-17 DIAGNOSIS — L905 Scar conditions and fibrosis of skin: Secondary | ICD-10-CM | POA: Diagnosis not present

## 2018-10-17 DIAGNOSIS — Z79899 Other long term (current) drug therapy: Secondary | ICD-10-CM | POA: Diagnosis not present

## 2018-10-30 DIAGNOSIS — H11243 Scarring of conjunctiva, bilateral: Secondary | ICD-10-CM | POA: Diagnosis not present

## 2018-10-30 DIAGNOSIS — H02054 Trichiasis without entropian left upper eyelid: Secondary | ICD-10-CM | POA: Diagnosis not present

## 2018-11-18 ENCOUNTER — Encounter: Payer: Self-pay | Admitting: Nurse Practitioner

## 2018-12-23 ENCOUNTER — Other Ambulatory Visit: Payer: Self-pay | Admitting: Family Medicine

## 2018-12-23 NOTE — Telephone Encounter (Signed)
Requested medication (s) are due for refill today: yes  Requested medication (s) are on the active medication list: yes  Last refill:  11/20/17  Future visit scheduled: yes  Notes to clinic:  prescription expired on 11/20/18. antilipid statins failed.  Requested Prescriptions  Pending Prescriptions Disp Refills   atorvastatin (LIPITOR) 20 MG tablet [Pharmacy Med Name: ATORVASTATIN TABS 20MG ] 90 tablet 3    Sig: TAKE 1 TABLET DAILY     Cardiovascular:  Antilipid - Statins Failed - 12/23/2018  4:32 PM      Failed - HDL in normal range and within 360 days    HDL  Date Value Ref Range Status  09/02/2018 32 (L) >39 mg/dL Final         Passed - Total Cholesterol in normal range and within 360 days    Cholesterol, Total  Date Value Ref Range Status  09/02/2018 123 100 - 199 mg/dL Final   Cholesterol Piccolo, Waived  Date Value Ref Range Status  05/20/2015 133 <200 mg/dL Final    Comment:                            Desirable                <200                         Borderline High      200- 239                         High                     >239          Passed - LDL in normal range and within 360 days    LDL Calculated  Date Value Ref Range Status  09/02/2018 62 0 - 99 mg/dL Final         Passed - Triglycerides in normal range and within 360 days    Triglycerides  Date Value Ref Range Status  09/02/2018 147 0 - 149 mg/dL Final   Triglycerides Piccolo,Waived  Date Value Ref Range Status  05/20/2015 222 (H) <150 mg/dL Final    Comment:                            Normal                   <150                         Borderline High     150 - 199                         High                200 - 499                         Very High                >499          Passed - Patient is not pregnant      Passed - Valid encounter within last 12 months    Recent Outpatient Visits  3 months ago Mixed hyperlipidemia   [facility] [place], Corrie DandyJolene T,  NP   1 year ago Essential hypertension   [facility] [place] CirriWicker, Cheryl, NP   1 year ago Impetigo   [facility] CirriWicker, Cheryl, NP   1 year ago Skin lesion of face   [facility] [place] CirriWicker, Cheryl, NP   1 year ago Mixed hyperlipidemia   [facility] Cheryl, [facility]      Future Appointments            In 2 months Cannady, Dorie RankJolene T, NP [facility]

## 2019-03-04 ENCOUNTER — Ambulatory Visit: Payer: BLUE CROSS/BLUE SHIELD | Admitting: Nurse Practitioner

## 2019-03-11 ENCOUNTER — Ambulatory Visit: Payer: BLUE CROSS/BLUE SHIELD | Admitting: Nurse Practitioner

## 2019-03-17 ENCOUNTER — Ambulatory Visit: Payer: BC Managed Care – PPO | Admitting: Nurse Practitioner

## 2019-03-19 DIAGNOSIS — Z79899 Other long term (current) drug therapy: Secondary | ICD-10-CM | POA: Diagnosis not present

## 2019-04-08 ENCOUNTER — Other Ambulatory Visit: Payer: Self-pay

## 2019-04-08 ENCOUNTER — Encounter: Payer: Self-pay | Admitting: Nurse Practitioner

## 2019-04-08 ENCOUNTER — Ambulatory Visit (INDEPENDENT_AMBULATORY_CARE_PROVIDER_SITE_OTHER): Payer: BC Managed Care – PPO | Admitting: Nurse Practitioner

## 2019-04-08 VITALS — BP 114/74 | HR 80 | Temp 98.7°F

## 2019-04-08 DIAGNOSIS — E782 Mixed hyperlipidemia: Secondary | ICD-10-CM

## 2019-04-08 DIAGNOSIS — E039 Hypothyroidism, unspecified: Secondary | ICD-10-CM

## 2019-04-08 DIAGNOSIS — I10 Essential (primary) hypertension: Secondary | ICD-10-CM

## 2019-04-08 DIAGNOSIS — D869 Sarcoidosis, unspecified: Secondary | ICD-10-CM | POA: Diagnosis not present

## 2019-04-08 NOTE — Progress Notes (Signed)
BP 114/74   Pulse 80   Temp 98.7 F (37.1 C) (Oral)   LMP  (LMP Unknown)   SpO2 97%    Subjective:    Patient ID: Danielle Sanford, female    DOB: 12-Feb-1961, 58 y.o.   MRN: [id number]  HPI: Danielle JunesCynthia M Petsch is a 58 y.o. female  Chief Complaint  Patient presents with  . Hyperlipidemia  . Hypertension  . Hypothyroidism   HYPERTENSION / HYPERLIPIDEMIA Continues on Lipitor for HLD and Propranolol for HTN. Satisfied with current treatment? yes Duration of hypertension: chronic BP monitoring frequency: not checking BP range: none BP medication side effects: no Duration of hyperlipidemia: chronic Cholesterol medication side effects: no Cholesterol supplements: none Medication compliance: good compliance Aspirin: no Recent stressors: no Recurrent headaches: no Visual changes: no Palpitations: no Dyspnea: no Chest pain: no Lower extremity edema: no Dizzy/lightheaded: no   HYPOTHYROIDISM Continues on Levothyroxine 175 MCG and last TSH 0.880. Thyroid control status:stable Satisfied with current treatment? yes Medication side effects: no Medication compliance: good compliance Etiology of hypothyroidism:  Recent dose adjustment:no Fatigue: no Cold intolerance: no Heat intolerance: no Weight gain: no Weight loss: no Constipation: no Diarrhea/loose stools: no Palpitations: no Lower extremity edema: no Anxiety/depressed mood: no   LOW PLATELET COUNT: On Plaquenil for sarcoidosis.  [facility] but has not been seen since 05/17/2016.  Is now being followed by dermatology, last saw 6 months ago and she reports they do follow labs there.  She reports the main issue is on her skin.  Had recommended she return for follow-up.  Last platelet count 140 in December 2019.  Denies easy bruising, blood in stool, nose bleeds, or gum bleeding.    Relevant past medical, surgical, family and social history reviewed and updated as indicated. Interim  medical history since our last visit reviewed. Allergies and medications reviewed and updated.  Review of Systems  Constitutional: Negative for activity change, appetite change, diaphoresis, fatigue and fever.  Respiratory: Negative for cough, chest tightness and shortness of breath.   Cardiovascular: Negative for chest pain, palpitations and leg swelling.  Gastrointestinal: Negative for abdominal distention, abdominal pain, constipation, diarrhea, nausea and vomiting.  Neurological: Negative for dizziness, syncope, weakness, light-headedness, numbness and headaches.  Psychiatric/Behavioral: Negative.     Per HPI unless specifically indicated above     Objective:    BP 114/74   Pulse 80   Temp 98.7 F (37.1 C) (Oral)   LMP  (LMP Unknown)   SpO2 97%   Wt Readings from Last 3 Encounters:  09/02/18 213 lb 7 oz (96.8 kg)  09/11/17 215 lb (97.5 kg)  07/12/17 216 lb 6.4 oz (98.2 kg)    Physical Exam Vitals signs and nursing note reviewed.  Constitutional:      General: She is awake. She is not in acute distress.    Appearance: She is well-developed. She is obese. She is not ill-appearing.  HENT:     Head: Normocephalic.     Right Ear: Hearing normal.     Left Ear: Hearing normal.     Nose: Nose normal.  Eyes:     General: Lids are normal.        Right eye: No discharge.        Left eye: No discharge.     Conjunctiva/sclera: Conjunctivae normal.     Pupils: Pupils are equal, round, and reactive to light.  Neck:     Musculoskeletal: Normal range of motion and neck  supple.     Thyroid: No thyromegaly.     Vascular: No carotid bruit.  Cardiovascular:     Rate and Rhythm: Normal rate and regular rhythm.     Heart sounds: Normal heart sounds. No murmur. No gallop.   Pulmonary:     Effort: Pulmonary effort is normal. No accessory muscle usage or respiratory distress.     Breath sounds: Normal breath sounds.  Abdominal:     General: Bowel sounds are normal.     Palpations:  Abdomen is soft.  Musculoskeletal:     Right lower leg: No edema.     Left lower leg: No edema.  Lymphadenopathy:     Cervical: No cervical adenopathy.  Skin:    General: Skin is warm and dry.  Neurological:     Mental Status: She is alert and oriented to person, place, and time.  Psychiatric:        Attention and Perception: Attention normal.        Mood and Affect: Mood normal.        Behavior: Behavior normal. Behavior is cooperative.        Thought Content: Thought content normal.        Judgment: Judgment normal.     Results for orders placed or performed in visit on 09/02/18  CBC with Differential/Platelet  Result Value Ref Range   WBC 4.2 3.4 - 10.8 x10E3/uL   RBC 4.87 3.77 - 5.28 x10E6/uL   Hemoglobin 12.6 11.1 - 15.9 g/dL   Hematocrit 16.138.2 09.634.0 - 46.6 %   MCV 78 (L) 79 - 97 fL   MCH 25.9 (L) 26.6 - 33.0 pg   MCHC 33.0 31.5 - 35.7 g/dL   RDW 04.514.6 40.912.3 - 81.115.4 %   Platelets 140 (L) 150 - 450 x10E3/uL   Neutrophils 74 Not Estab. %   Lymphs 11 Not Estab. %   Monocytes 10 Not Estab. %   Eos 4 Not Estab. %   Basos 1 Not Estab. %   Neutrophils Absolute 3.1 1.4 - 7.0 x10E3/uL   Lymphocytes Absolute 0.5 (L) 0.7 - 3.1 x10E3/uL   Monocytes Absolute 0.4 0.1 - 0.9 x10E3/uL   EOS (ABSOLUTE) 0.2 0.0 - 0.4 x10E3/uL   Basophils Absolute 0.0 0.0 - 0.2 x10E3/uL   Immature Granulocytes 0 Not Estab. %   Immature Grans (Abs) 0.0 0.0 - 0.1 x10E3/uL  Comprehensive metabolic panel  Result Value Ref Range   Glucose 90 65 - 99 mg/dL   BUN 11 6 - 24 mg/dL   Creatinine, Ser 9.140.75 0.57 - 1.00 mg/dL   GFR calc non Af Amer 88 >59 mL/min/1.73   GFR calc Af Amer 102 >59 mL/min/1.73   BUN/Creatinine Ratio 15 9 - 23   Sodium 138 134 - 144 mmol/L   Potassium 4.3 3.5 - 5.2 mmol/L   Chloride 101 96 - 106 mmol/L   CO2 25 20 - 29 mmol/L   Calcium 9.3 8.7 - 10.2 mg/dL   Total Protein 6.8 6.0 - 8.5 g/dL   Albumin 4.0 3.5 - 5.5 g/dL   Globulin, Total 2.8 1.5 - 4.5 g/dL   Albumin/Globulin Ratio  1.4 1.2 - 2.2   Bilirubin Total 0.5 0.0 - 1.2 mg/dL   Alkaline Phosphatase 105 39 - 117 IU/L   AST 18 0 - 40 IU/L   ALT 19 0 - 32 IU/L  Lipid Panel w/o Chol/HDL Ratio  Result Value Ref Range   Cholesterol, Total 123 100 - 199 mg/dL  Triglycerides 147 0 - 149 mg/dL   HDL 32 (L) >39 mg/dL   VLDL Cholesterol Cal 29 5 - 40 mg/dL   LDL Calculated 62 0 - 99 mg/dL  Thyroid Panel With TSH  Result Value Ref Range   TSH 0.880 0.450 - 4.500 uIU/mL   T4, Total 11.1 4.5 - 12.0 ug/dL   T3 Uptake Ratio 30 24 - 39 %   Free Thyroxine Index 3.3 1.2 - 4.9      Assessment & Plan:   Problem List Items Addressed This Visit      Cardiovascular and Mediastinum   Hypertension    Chronic, stable with BP below goal.  Continue current medication regimen.  CMP today.      Relevant Orders   Comprehensive metabolic panel   CBC with Differential/Platelet     Endocrine   Hypothyroidism - Primary    Chronic, stable.  Continue current medication regimen and adjust as needed.  TSH today.         Relevant Orders   Thyroid Panel With TSH     Other   Sarcoid    Followed by dermatology.  Will recheck CBC today.      Hyperlipidemia    Chronic, stable.  Continue current medication regimen.  CMP and lipid panel today.      Relevant Orders   Lipid Panel w/o Chol/HDL Ratio       Follow up plan: Return in about 6 months (around 10/09/2019) for HTN/HLD, hypothyroid.

## 2019-04-08 NOTE — Assessment & Plan Note (Signed)
Chronic, stable.  Continue current medication regimen and adjust as needed.  TSH today.    

## 2019-04-08 NOTE — Assessment & Plan Note (Signed)
Chronic, stable with BP below goal.  Continue current medication regimen.  CMP today.

## 2019-04-08 NOTE — Patient Instructions (Signed)

## 2019-04-08 NOTE — Assessment & Plan Note (Signed)
Chronic, stable.  Continue current medication regimen.  CMP and lipid panel today. 

## 2019-04-08 NOTE — Assessment & Plan Note (Signed)
Followed by dermatology.  Will recheck CBC today.

## 2019-04-09 LAB — CBC WITH DIFFERENTIAL/PLATELET
Basophils Absolute: 0 10*3/uL (ref 0.0–0.2)
Basos: 0 %
EOS (ABSOLUTE): 0.2 10*3/uL (ref 0.0–0.4)
Eos: 5 %
Hematocrit: 38.4 % (ref 34.0–46.6)
Hemoglobin: 12.4 g/dL (ref 11.1–15.9)
Immature Grans (Abs): 0 10*3/uL (ref 0.0–0.1)
Immature Granulocytes: 1 %
Lymphocytes Absolute: 0.6 10*3/uL — ABNORMAL LOW (ref 0.7–3.1)
Lymphs: 11 %
MCH: 25.3 pg — ABNORMAL LOW (ref 26.6–33.0)
MCHC: 32.3 g/dL (ref 31.5–35.7)
MCV: 78 fL — ABNORMAL LOW (ref 79–97)
Monocytes Absolute: 0.5 10*3/uL (ref 0.1–0.9)
Monocytes: 10 %
Neutrophils Absolute: 3.8 10*3/uL (ref 1.4–7.0)
Neutrophils: 73 %
Platelets: 168 10*3/uL (ref 150–450)
RBC: 4.9 x10E6/uL (ref 3.77–5.28)
RDW: 15.2 % (ref 11.7–15.4)
WBC: 5.1 10*3/uL (ref 3.4–10.8)

## 2019-04-09 LAB — COMPREHENSIVE METABOLIC PANEL
ALT: 17 IU/L (ref 0–32)
AST: 20 IU/L (ref 0–40)
Albumin/Globulin Ratio: 1.3 (ref 1.2–2.2)
Albumin: 3.8 g/dL (ref 3.8–4.9)
Alkaline Phosphatase: 100 IU/L (ref 39–117)
BUN/Creatinine Ratio: 15 (ref 9–23)
BUN: 11 mg/dL (ref 6–24)
Bilirubin Total: 0.4 mg/dL (ref 0.0–1.2)
CO2: 24 mmol/L (ref 20–29)
Calcium: 9.1 mg/dL (ref 8.7–10.2)
Chloride: 102 mmol/L (ref 96–106)
Creatinine, Ser: 0.73 mg/dL (ref 0.57–1.00)
GFR calc Af Amer: 105 mL/min/{1.73_m2} (ref >59)
GFR calc non Af Amer: 91 mL/min/{1.73_m2} (ref >59)
Globulin, Total: 2.9 g/dL (ref 1.5–4.5)
Glucose: 89 mg/dL (ref 65–99)
Potassium: 3.9 mmol/L (ref 3.5–5.2)
Sodium: 138 mmol/L (ref 134–144)
Total Protein: 6.7 g/dL (ref 6.0–8.5)

## 2019-04-09 LAB — LIPID PANEL W/O CHOL/HDL RATIO
Cholesterol, Total: 127 mg/dL (ref 100–199)
HDL: 33 mg/dL — ABNORMAL LOW (ref >39)
LDL Calculated: 53 mg/dL (ref 0–99)
Triglycerides: 207 mg/dL — ABNORMAL HIGH (ref 0–149)
VLDL Cholesterol Cal: 41 mg/dL — ABNORMAL HIGH (ref 5–40)

## 2019-04-09 LAB — THYROID PANEL WITH TSH
Free Thyroxine Index: 2.8 (ref 1.2–4.9)
T3 Uptake Ratio: 29 % (ref 24–39)
T4, Total: 9.8 ug/dL (ref 4.5–12.0)
TSH: 1.49 u[IU]/mL (ref 0.450–4.500)

## 2019-06-15 DIAGNOSIS — D863 Sarcoidosis of skin: Secondary | ICD-10-CM | POA: Diagnosis not present

## 2019-06-15 DIAGNOSIS — L905 Scar conditions and fibrosis of skin: Secondary | ICD-10-CM | POA: Diagnosis not present

## 2019-07-15 DIAGNOSIS — D869 Sarcoidosis, unspecified: Secondary | ICD-10-CM | POA: Diagnosis not present

## 2019-07-23 ENCOUNTER — Other Ambulatory Visit: Payer: Self-pay

## 2019-07-24 ENCOUNTER — Ambulatory Visit (INDEPENDENT_AMBULATORY_CARE_PROVIDER_SITE_OTHER): Payer: BC Managed Care – PPO

## 2019-07-24 ENCOUNTER — Other Ambulatory Visit: Payer: Self-pay

## 2019-07-24 DIAGNOSIS — Z23 Encounter for immunization: Secondary | ICD-10-CM

## 2019-07-24 DIAGNOSIS — H02055 Trichiasis without entropian left lower eyelid: Secondary | ICD-10-CM | POA: Diagnosis not present

## 2019-08-05 ENCOUNTER — Other Ambulatory Visit: Payer: Self-pay | Admitting: Nurse Practitioner

## 2019-08-20 DIAGNOSIS — H02054 Trichiasis without entropian left upper eyelid: Secondary | ICD-10-CM | POA: Diagnosis not present

## 2019-09-09 ENCOUNTER — Ambulatory Visit: Payer: BC Managed Care – PPO

## 2019-09-09 ENCOUNTER — Other Ambulatory Visit: Payer: Self-pay

## 2019-09-09 ENCOUNTER — Ambulatory Visit: Payer: Self-pay

## 2019-09-09 NOTE — Telephone Encounter (Signed)
Patient called stating that she has had a possible exposure to COVID-19.  She states that Sunday she had dinner with her daughter and her boyfriend. Her daughters boyfriend has just found that several of his co workers have tested positive. She is unsure of his contact with them except same office building.  She states he was not sick Sunday and is not symptomatic now. He has just been tested today. Patient states that daughter does not live with her. Her daughter has no symptoms. Ms Osei has no symptoms.  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 Patient was educated on symptoms.  She was told that symptoms occur from 2-14 day after exposure. She can test but it is recommended to wait 6-8 day after exposure. Wear mask and monitor for symptoms. She verbalized understanding of all information.  She has test site information.    Reason for Disposition . [1] Caller concerned that exposure to COVID-19 occurred BUT [2] does not meet COVID-19 EXPOSURE criteria from [facility]  Answer Assessment - Initial Assessment Questions 1. COVID-19 CLOSE CONTACT: "Who is the person with the confirmed or suspected COVID-19 infection that you were exposed to?"     boyfriend 2. PLACE of CONTACT: "Where were you when you were exposed to COVID-19?" (e.g., home, school, medical waiting room; which city?)    home 3. TYPE of CONTACT: "How much contact was there?" (e.g., sitting next to, live in same house, work in same office, same building)    Dinner over weekend 4. DURATION of CONTACT: "How long were you in contact with the COVID-19 patient?" (e.g., a few seconds, passed by person, a few minutes, 15 minutes or longer, live with the patient)     Few hours 5. MASK: "Were you wearing a mask?" "Was the other person wearing a mask?" Note: wearing a mask reduces the risk of an  otherwise close contact.     He wore mast until eating 6. DATE of CONTACT: "When did you have contact with a COVID-19 patient?" (e.g., how many days ago)  Sunday 7. COMMUNITY SPREAD: "Are there lots of cases of COVID-19 (community spread) where you live?" (See public health department website, if unsure)       [place] 8. SYMPTOMS: "Do you have any symptoms?" (e.g., fever, cough, breathing difficulty, loss of taste or smell)     no 9. PREGNANCY OR POSTPARTUM: "Is there any chance you are pregnant?" "When was your last menstrual period?" "Did you deliver in the last 2 weeks?"    No 10. HIGH RISK: "Do you have any heart or lung problems? Do you have a weak immune system?" (e.g., heart failure, COPD, asthma, HIV positive, chemotherapy, renal failure, diabetes mellitus, sickle cell anemia, obesity)     Auto immune Dx 11.  TRAVEL: "Have you traveled out of the country recently?" If so, "When and where?"  Also ask about out-of-state travel, since the CDC has identified some high-risk cities for community spread in the [place].  Note: Travel becomes less relevant if there is widespread community transmission where the patient lives.       N/A  Protocols used: CORONAVIRUS (COVID-19) EXPOSURE-A-AH

## 2019-09-16 ENCOUNTER — Other Ambulatory Visit: Payer: Self-pay | Admitting: Nurse Practitioner

## 2019-10-09 ENCOUNTER — Ambulatory Visit: Payer: BC Managed Care – PPO | Admitting: Nurse Practitioner

## 2019-12-10 DIAGNOSIS — H02054 Trichiasis without entropian left upper eyelid: Secondary | ICD-10-CM | POA: Diagnosis not present

## 2019-12-15 ENCOUNTER — Ambulatory Visit (INDEPENDENT_AMBULATORY_CARE_PROVIDER_SITE_OTHER): Payer: BC Managed Care – PPO | Admitting: Dermatology

## 2019-12-15 ENCOUNTER — Other Ambulatory Visit: Payer: Self-pay | Admitting: Family Medicine

## 2019-12-15 ENCOUNTER — Other Ambulatory Visit: Payer: Self-pay

## 2019-12-15 DIAGNOSIS — D863 Sarcoidosis of skin: Secondary | ICD-10-CM | POA: Diagnosis not present

## 2019-12-15 NOTE — Progress Notes (Signed)
   Follow-Up Visit   Subjective  Danielle Sanford is a 59 y.o. female who presents for the following: Sarcoidosis (Stable, no worsening. Plaquenil 200mg  BID (no headaches, no vision changes, no fatigue), clobetasol cream to raised areas prn, tacrolimus 0.1% ointment prn flat areas.). Not using topicals that much.  She gets her yearly eye exam in the summer at [facility].   The following portions of the chart were reviewed this encounter and updated as appropriate:     Review of Systems: No other skin or systemic complaints.  Objective  Well appearing patient in no apparent distress; mood and affect are within normal limits.  A focused examination was performed including face, arms, chest, R leg. Relevant physical exam findings are noted in the Assessment and Plan.  Objective  R shoulder, R upper arm, R cheek, nasal ala, L upper and lower lip, R chest, L elbow, L forearm, R medial ankle: Pink and yellow/brown smooth patches and plaques with scarring  Assessment & Plan  Sarcoidosis of skin R shoulder, R upper arm, R cheek, nasal ala, L upper and lower lip, R chest, L elbow, L forearm, R medial ankle  Stable. Cont tacrolimus 0.1% ointment to AAs face/body qd/bid prn.  Cont clobetasol cream to raised, active areas qd/bid prn. Avoid face, groin, axilla.  Cont Plaquenil 200mg  take 1 po BID, pending labs. Will send to [facility] 90-day supply with 1 Rf.  Hydroxychloroquine (plaquenil) can rarely cause irreversible damage to the retina of the eye if used for a long period of time. This damage can be identified and the medication stopped before vision changes are noticed by going for a yearly ophthalmology exam. Rarely, this medicine can cause low blood counts or a color change of the skin.   Pt to continue yearly eye exams. Pt will have notes from Dr Dingeldein sent to Dr [facility].  Other Related Procedures Lipid panel Comprehensive metabolic panel CBC with  Differential/Platelet  Return in about 6 months (around 06/16/2020) for sarcoidosis.   IKathie Rhodes, CMA, am acting as scribe for 06/18/2020, MD .

## 2020-01-08 ENCOUNTER — Other Ambulatory Visit: Payer: Self-pay | Admitting: Nurse Practitioner

## 2020-01-08 NOTE — Telephone Encounter (Signed)
Medication Refill - Medication: propranolol (INDERAL) 10 MG tablet [[id number]]     Preferred Pharmacy (with phone number or street name):  [facility] - [place], [place] - [address]  [address] [place] [address]  Phone: [phone] Fax: [phone]  Not a 24 hour pharmacy; exact hours not known     Agent: Please be advised that RX refills may take up to 3 business days. We ask that you follow-up with your pharmacy.

## 2020-01-08 NOTE — Telephone Encounter (Signed)
Requested medication (s) are due for refill today:  Yes  Requested medication (s) are on the active medication list:  Yes  Future visit scheduled: No  Last Refill: 09/16/19; #90; RFx1  Notes to clinic:  Called pt. In effort to schedule a f/u appt.  Left vm that her 6 mo. F/u appt. Was due in 09/2019, and to please call office to schedule an appt.   Requested Prescriptions  Pending Prescriptions Disp Refills   propranolol (INDERAL) 10 MG tablet 90 tablet 1    Sig: Take 1 tablet (10 mg total) by mouth 3 (three) times daily.      Cardiovascular:  Beta Blockers Failed - 01/08/2020  4:53 PM      Failed - Valid encounter within last 6 months    Recent Outpatient Visits           9 months ago Hypothyroidism, unspecified type   [facility] [place], Dorie Rank, NP   1 year ago Mixed hyperlipidemia   [facility] [place], Wil[place]te T, NP   2 years ago Essential hypertension   [facility] Gabriel Cirri, NP   2 years ago Impetigo   [facility] Gabriel Cirri, NP   2 years ago Skin lesion of face   [facility] Gabriel Cirri, NP              Passed - Last BP in normal range    BP Readings from Last 1 Encounters:  04/08/19 114/74          Passed - Last Heart Rate in normal range    Pulse Readings from Last 1 Encounters:  04/08/19 80

## 2020-01-11 MED ORDER — PROPRANOLOL HCL 10 MG PO TABS: 10.0000 mg | ORAL_TABLET | Freq: Three times a day (TID) | ORAL | 0 refills | Status: DC

## 2020-01-11 NOTE — Telephone Encounter (Signed)
Lvm ,30 day supply sent only, needs follow-up as last visit in July.  This can be on a virtual

## 2020-01-11 NOTE — Telephone Encounter (Signed)
Called patient to schedule a follow up. Patient had an appt on 01/18/20 and she rescheduled it for 01/20/20 at 2 pm. Patient preferred in office visit.

## 2020-01-11 NOTE — Telephone Encounter (Signed)
Routing to provider  

## 2020-01-18 ENCOUNTER — Ambulatory Visit: Payer: BC Managed Care – PPO | Admitting: Nurse Practitioner

## 2020-01-20 ENCOUNTER — Ambulatory Visit: Payer: BC Managed Care – PPO | Admitting: Nurse Practitioner

## 2020-01-20 DIAGNOSIS — H02055 Trichiasis without entropian left lower eyelid: Secondary | ICD-10-CM | POA: Diagnosis not present

## 2020-01-26 ENCOUNTER — Ambulatory Visit: Payer: BC Managed Care – PPO | Admitting: Nurse Practitioner

## 2020-01-27 ENCOUNTER — Telehealth: Payer: Self-pay | Admitting: Nurse Practitioner

## 2020-01-27 ENCOUNTER — Other Ambulatory Visit: Payer: Self-pay

## 2020-01-27 ENCOUNTER — Ambulatory Visit (INDEPENDENT_AMBULATORY_CARE_PROVIDER_SITE_OTHER): Payer: BC Managed Care – PPO | Admitting: Nurse Practitioner

## 2020-01-27 ENCOUNTER — Encounter: Payer: Self-pay | Admitting: Nurse Practitioner

## 2020-01-27 VITALS — BP 115/75 | HR 82 | Temp 97.5°F | Wt 209.0 lb

## 2020-01-27 DIAGNOSIS — E6609 Other obesity due to excess calories: Secondary | ICD-10-CM

## 2020-01-27 DIAGNOSIS — E782 Mixed hyperlipidemia: Secondary | ICD-10-CM

## 2020-01-27 DIAGNOSIS — E039 Hypothyroidism, unspecified: Secondary | ICD-10-CM | POA: Diagnosis not present

## 2020-01-27 DIAGNOSIS — D869 Sarcoidosis, unspecified: Secondary | ICD-10-CM | POA: Diagnosis not present

## 2020-01-27 DIAGNOSIS — Z6831 Body mass index (BMI) 31.0-31.9, adult: Secondary | ICD-10-CM

## 2020-01-27 DIAGNOSIS — E669 Obesity, unspecified: Secondary | ICD-10-CM | POA: Insufficient documentation

## 2020-01-27 DIAGNOSIS — Z1231 Encounter for screening mammogram for malignant neoplasm of breast: Secondary | ICD-10-CM

## 2020-01-27 DIAGNOSIS — I10 Essential (primary) hypertension: Secondary | ICD-10-CM | POA: Diagnosis not present

## 2020-01-27 NOTE — Telephone Encounter (Signed)
Patient notified of Jolene's message.   °

## 2020-01-27 NOTE — Progress Notes (Addendum)
BP 115/75   Pulse 82   Temp (!) 97.5 F (36.4 C) (Oral)   Wt 209 lb (94.8 kg)   LMP  (LMP Unknown)   SpO2 94%   BMI 31.69 kg/m    Subjective:    Patient ID: Danielle Sanford, female    DOB: May 11, 1961, 59 y.o.   MRN: [id number]  HPI: Danielle Sanford is a 59 y.o. female  Chief Complaint  Patient presents with  . Hyperlipidemia  . Hypertension  . Hypothyroidism   HYPERTENSION / HYPERLIPIDEMIA Continues on Lipitor for HLD and Propranolol for HTN. Satisfied with current treatment? yes Duration of hypertension: chronic BP monitoring frequency: not checking BP range: none BP medication side effects: no Duration of hyperlipidemia: chronic Cholesterol medication side effects: no Cholesterol supplements: none Medication compliance: good compliance Aspirin: no Recent stressors: no Recurrent headaches: no Visual changes: no Palpitations: no Dyspnea: no Chest pain: no Lower extremity edema: no Dizzy/lightheaded: no  The ASCVD Risk score Denman George DC Jr., et al., 2013) failed to calculate for the following reasons:   The valid total cholesterol range is 130 to 320 mg/dL  HYPOTHYROIDISM Continues on Levothyroxine 175 MCG and last TSH 1.490. Thyroid control status:stable Satisfied with current treatment? 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  Was being followed by [facility] rheumatology, but has not been seen since 05/17/2016.  Is now being followed by dermatology, last saw 12/15/19 and no changes made.  She reports the main issue is on her skin.   Last platelet count 168 in July 2020.  Reports she recently went to have labs done with  dermatology.  Denies easy bruising, blood in stool, nose bleeds, or gum bleeding.    Relevant past medical, surgical, family and social history reviewed and updated as indicated. Interim medical history since our last visit reviewed. Allergies and medications reviewed and updated.  Review of Systems  Constitutional: Negative for activity change, appetite change, diaphoresis, fatigue and fever.  Respiratory: Negative for cough, chest tightness and shortness of breath.   Cardiovascular: Negative for chest pain, palpitations and leg swelling.  Gastrointestinal: Negative.   Neurological: Negative.   Psychiatric/Behavioral: Negative.     Per HPI unless specifically indicated above     Objective:    BP 115/75   Pulse 82   Temp (!) 97.5 F (36.4 C) (Oral)   Wt 209 lb (94.8 kg)   LMP  (LMP Unknown)   SpO2 94%   BMI 31.69 kg/m   Wt Readings from Last 3 Encounters:  01/27/20 209 lb (94.8 kg)  09/02/18 213 lb 7 oz (96.8 kg)  09/11/17 215 lb (97.5 kg)    Physical Exam Vitals and nursing note reviewed.  Constitutional:      General: She is awake. She is not in acute distress.    Appearance: She is well-developed. She is obese. She is not ill-appearing.  HENT:     Head: Normocephalic.     Right Ear: Hearing normal.     Left Ear: Hearing normal.     Nose: Nose normal.  Eyes:     General: Lids are normal.        Right eye: No discharge.        Left eye:  No discharge.     Conjunctiva/sclera: Conjunctivae normal.     Pupils: Pupils are equal, round, and reactive to light.  Neck:     Thyroid: No thyromegaly.     Vascular: No carotid bruit.  Cardiovascular:     Rate and Rhythm: Normal rate and regular rhythm.     Heart sounds: Normal heart sounds. No murmur. No gallop.   Pulmonary:     Effort: Pulmonary effort is normal. No accessory muscle usage or respiratory distress.     Breath sounds: Normal breath sounds.  Abdominal:     General: Bowel sounds are normal.     Palpations:  Abdomen is soft.  Musculoskeletal:     Cervical back: Normal range of motion and neck supple.     Right lower leg: No edema.     Left lower leg: No edema.  Lymphadenopathy:     Cervical: No cervical adenopathy.  Skin:    General: Skin is warm and dry.  Neurological:     Mental Status: She is alert and oriented to person, place, and time.  Psychiatric:        Attention and Perception: Attention normal.        Mood and Affect: Mood normal.        Behavior: Behavior normal. Behavior is cooperative.        Thought Content: Thought content normal.        Judgment: Judgment normal.     Results for orders placed or performed in visit on 04/08/19  Lipid Panel w/o Chol/HDL Ratio  Result Value Ref Range   Cholesterol, Total 127 100 - 199 mg/dL   Triglycerides 207 (H) 0 - 149 mg/dL   HDL 33 (L) >39 mg/dL   VLDL Cholesterol Cal 41 (H) 5 - 40 mg/dL   LDL Calculated 53 0 - 99 mg/dL  Comprehensive metabolic panel  Result Value Ref Range   Glucose 89 65 - 99 mg/dL   BUN 11 6 - 24 mg/dL   Creatinine, Ser 0.73 0.57 - 1.00 mg/dL   GFR calc non Af Amer 91 >59 mL/min/1.73   GFR calc Af Amer 105 >59 mL/min/1.73   BUN/Creatinine Ratio 15 9 - 23   Sodium 138 134 - 144 mmol/L   Potassium 3.9 3.5 - 5.2 mmol/L   Chloride 102 96 - 106 mmol/L   CO2 24 20 - 29 mmol/L   Calcium 9.1 8.7 - 10.2 mg/dL   Total Protein 6.7 6.0 - 8.5 g/dL   Albumin 3.8 3.8 - 4.9 g/dL   Globulin, Total 2.9 1.5 - 4.5 g/dL   Albumin/Globulin Ratio 1.3 1.2 - 2.2   Bilirubin Total 0.4 0.0 - 1.2 mg/dL   Alkaline Phosphatase 100 39 - 117 IU/L   AST 20 0 - 40 IU/L   ALT 17 0 - 32 IU/L  Thyroid Panel With TSH  Result Value Ref Range   TSH 1.490 0.450 - 4.500 uIU/mL   T4, Total 9.8 4.5 - 12.0 ug/dL   T3 Uptake Ratio 29 24 - 39 %   Free Thyroxine Index 2.8 1.2 - 4.9  CBC with Differential/Platelet  Result Value Ref Range   WBC 5.1 3.4 - 10.8 x10E3/uL   RBC 4.90 3.77 - 5.28 x10E6/uL   Hemoglobin 12.4 11.1 - 15.9 g/dL    Hematocrit 38.4 34.0 - 46.6 %   MCV 78 (L) 79 - 97 fL   MCH 25.3 (L) 26.6 - 33.0 pg   MCHC 32.3 31.5 - 35.7 g/dL  RDW 15.2 11.7 - 15.4 %   Platelets 168 150 - 450 x10E3/uL   Neutrophils 73 Not Estab. %   Lymphs 11 Not Estab. %   Monocytes 10 Not Estab. %   Eos 5 Not Estab. %   Basos 0 Not Estab. %   Neutrophils Absolute 3.8 1.4 - 7.0 x10E3/uL   Lymphocytes Absolute 0.6 (L) 0.7 - 3.1 x10E3/uL   Monocytes Absolute 0.5 0.1 - 0.9 x10E3/uL   EOS (ABSOLUTE) 0.2 0.0 - 0.4 x10E3/uL   Basophils Absolute 0.0 0.0 - 0.2 x10E3/uL   Immature Granulocytes 1 Not Estab. %   Immature Grans (Abs) 0.0 0.0 - 0.1 x10E3/uL      Assessment & Plan:   Problem List Items Addressed This Visit      Cardiovascular and Mediastinum   Hypertension - Primary    Chronic, stable with BP below goal.  Continue current medication regimen and adjust as needed.  BMP today.      Relevant Orders   Basic metabolic panel     Endocrine   Hypothyroidism    Chronic, stable.  Continue current medication regimen and adjust as needed.  TSH today.         Relevant Orders   Thyroid Panel With TSH     Other   Sarcoid    Followed by dermatology.  Continue regimen as prescribed by them, Plaquenil, she reports labs done this week by them.  Reviewed recent note.      Hyperlipidemia    Chronic, stable.  Continue current medication regimen and adjust as needed.  Lipid panel today.      Relevant Orders   Lipid Panel w/o Chol/HDL Ratio   Obesity    Recommended eating smaller high protein, low fat meals more frequently and exercising 30 mins a day 5 times a week with a goal of 10-15lb weight loss in the next 3 months. Patient voiced their understanding and motivation to adhere to these recommendations.        Other Visit Diagnoses    Encounter for screening mammogram for malignant neoplasm of breast       Mammogram ordered   Relevant Orders   MM 3D SCREEN BREAST BILATERAL       Follow up plan: Return in about 6  months (around 07/29/2020) for HTN/HLD, Hypothyroid, Sarcoidosis skin.

## 2020-01-27 NOTE — Assessment & Plan Note (Signed)
Chronic, stable with BP below goal.  Continue current medication regimen and adjust as needed.  BMP today.

## 2020-01-27 NOTE — Patient Instructions (Signed)
Sarcoidosis ° °Sarcoidosis is a disease that can cause inflammation in many areas of the body. It most often affects the lungs (pulmonary sarcoidosis). Sarcoidosis can also affect the lymph nodes, liver, eyes, skin, heart, or any other body tissue. °Normally, cells that are part of your body's disease-fighting system (immune system) attack harmful substances (such as germs) in your body. This immune system response causes inflammation. After the harmful substance is destroyed, the inflammation and the immune cells go away. When you have sarcoidosis, your immune system causes inflammation even when there are no harmful substances, and the inflammation does not go away. Sarcoidosis also causes cells from your immune system to form small clumps of tissue (granulomas) in the affected area of your body. °What are the causes? °The exact cause of sarcoidosis is not known.  °It is possible that if you have a family history of this disease (genetic predisposition), the immune system response that leads to inflammation may be triggered by something in your environment, such as: °· Bacteria or viruses. °· Metals. °· Chemicals. °· Dust. °· Mold or mildew. °What increases the risk? °You may be at a greater risk for sarcoidosis if you: °· Have a family history of the disease. °· Are African-American. °· Are of Northern European descent. °· Are 20-50 years old. °· Work as a firefighter. °· Work in an environment where you are exposed to metals, chemicals, mold or mildew, or insecticides. °What are the signs or symptoms? °Some people with sarcoidosis have no symptoms. Others have very mild symptoms. The symptoms usually depend on the organ that is affected. Sarcoidosis most often affects the lungs, which may include symptoms such as: °· Chest pain. °· Coughing. °· Wheezing. °· Shortness of breath. °Other common symptoms include: °· Night sweats. °· Fever. °· Weight loss. °· Fatigue. °· Swollen lymph nodes. °· Joint pain. °How is  this diagnosed? °Sarcoidosis may be diagnosed based on: °· Your symptoms and medical history. °· A physical exam. °· Imaging tests to check for granulomas such as: °? Chest X-ray. °? CT scan. °? MRI. °? PET scan. °· Lung function tests. These tests evaluate your breathing and check for problems that may be related to sarcoidosis. °· A procedure to remove a tissue sample for testing (biopsy). You may have a biopsy of lung tissue if that is where you are having symptoms. °You may have tests to check for any complications of the condition. These tests may include: °· Eye exams. °· MRI of the heart or brain. °· Echocardiogram. °· Electrocardiogram (EKG or ECG). °How is this treated? °In some cases, sarcoidosis does not require a specific treatment because it causes no symptoms or mild symptoms. If your symptoms bother you or are severe, you may be prescribed medicines to reduce inflammation or relieve symptoms. These medicines may include: °· Prednisone. This is a steroid that reduces inflammation related to sarcoidosis. °· Hydroxychloroquine. This may be used to treat sarcoidosis that affects the skin, eyes, or brain. °· Methotrexate, leflunomide, or azathioprine. These medicines affect the immune system and can help with sarcoidosis in the joints, eyes, skin, or lungs. °· Medicines that you breathe in (inhalers). Inhalers can help you breathe if sarcoidosis affects your lungs. °Follow these instructions at home: ° °· Do not use any products that contain nicotine or tobacco, such as cigarettes and e-cigarettes. If you need help quitting, ask your health care provider. °· Avoid secondhand smoke and irritating dust or chemicals. Stay indoors on days when air quality is poor   in your area. °· Return to your normal activities as told by your health care provider. Ask your health care provider what activities are safe for you. °· Take or use over-the-counter and prescription medicines only as told by your health care  provider. °· Keep all follow-up visits as told by your health care provider. This is important. °Contact a health care provider if: °· You have vision problems. °· You have a dry cough that does not go away. °· You have an irregular heartbeat. °· You have pain or aches in your joints, hands, or feet. °· You have an unexplained rash. °Get help right away if: °· You have chest pain. °· You have difficulty breathing. °Summary °· Sarcoidosis is a disease that can cause inflammation in many body areas of the body. It most often affects the lungs (pulmonary sarcoidosis). It can also affect the lymph nodes, liver, eyes, skin, heart, or any other body tissue. °· When you have sarcoidosis, cells from your immune system form small clumps of tissue (granulomas) in the affected area of your body. °· Sarcoidosis sometimes does not require a specific treatment because it causes no symptoms or mild symptoms. °· If your symptoms bother you or are severe, you may be prescribed medicines to reduce inflammation or relieve symptoms. °This information is not intended to replace advice given to you by your health care provider. Make sure you discuss any questions you have with your health care provider. °Document Revised: 08/16/2017 Document Reviewed: 06/11/2017 °Elsevier Patient Education © 2020 [employer] ° °

## 2020-01-27 NOTE — Telephone Encounter (Signed)
Copied from CRM [id number]. Topic: General - Other >> Jan 27, 2020 12:10 PM Wyonia Hough E wrote: Reason for CRM: Pt called and said when the nurse took her BP and pulse, the oxygen level did not come up but on the paper she has her oxygen level was on there and it was not her normal level. She didn't see it on the machines monitor and she was concerned and she asked  to have it checked again in the next few months/ she didn't think the reading was right / please advise

## 2020-01-27 NOTE — Telephone Encounter (Signed)
Please reassure patient O2 sat was within normal range at 94% today, but if she would like definitely can schedule follow-up in next couple months to visit and recheck.  Let her know her lungs were very clear when I listened too.  Thank you!!

## 2020-01-27 NOTE — Assessment & Plan Note (Signed)
Chronic, stable.  Continue current medication regimen and adjust as needed.  TSH today.

## 2020-01-27 NOTE — Assessment & Plan Note (Addendum)
Chronic, stable.  Continue current medication regimen and adjust as needed.  Lipid panel today. 

## 2020-01-27 NOTE — Assessment & Plan Note (Signed)
Recommended eating smaller high protein, low fat meals more frequently and exercising 30 mins a day 5 times a week with a goal of 10-15lb weight loss in the next 3 months. Patient voiced their understanding and motivation to adhere to these recommendations.  

## 2020-01-27 NOTE — Assessment & Plan Note (Signed)
Followed by dermatology.  Continue regimen as prescribed by them, Plaquenil, she reports labs done this week by them.  Reviewed recent note.

## 2020-01-28 ENCOUNTER — Other Ambulatory Visit: Payer: Self-pay | Admitting: Nurse Practitioner

## 2020-01-28 DIAGNOSIS — E039 Hypothyroidism, unspecified: Secondary | ICD-10-CM

## 2020-01-28 LAB — LIPID PANEL W/O CHOL/HDL RATIO
Cholesterol, Total: 121 mg/dL (ref 100–199)
HDL: 34 mg/dL — ABNORMAL LOW (ref >39)
LDL Chol Calc (NIH): 56 mg/dL (ref 0–99)
Triglycerides: 189 mg/dL — ABNORMAL HIGH (ref 0–149)
VLDL Cholesterol Cal: 31 mg/dL (ref 5–40)

## 2020-01-28 LAB — BASIC METABOLIC PANEL
BUN/Creatinine Ratio: 12 (ref 9–23)
BUN: 9 mg/dL (ref 6–24)
CO2: 25 mmol/L (ref 20–29)
Calcium: 9.1 mg/dL (ref 8.7–10.2)
Chloride: 104 mmol/L (ref 96–106)
Creatinine, Ser: 0.75 mg/dL (ref 0.57–1.00)
GFR calc Af Amer: 101 mL/min/{1.73_m2} (ref >59)
GFR calc non Af Amer: 88 mL/min/{1.73_m2} (ref >59)
Glucose: 79 mg/dL (ref 65–99)
Potassium: 4 mmol/L (ref 3.5–5.2)
Sodium: 140 mmol/L (ref 134–144)

## 2020-01-28 LAB — THYROID PANEL WITH TSH
Free Thyroxine Index: 2.9 (ref 1.2–4.9)
T3 Uptake Ratio: 30 % (ref 24–39)
T4, Total: 9.7 ug/dL (ref 4.5–12.0)
TSH: 0.338 u[IU]/mL — ABNORMAL LOW (ref 0.450–4.500)

## 2020-01-28 NOTE — Progress Notes (Signed)
Good morning, please let Danielle Sanford know her labs have returned.  Her thyroid testing did show slightly low TSH, but normal T4, I would like to recheck this via outpatient labs only (will order lab) in 4 weeks to ensure we do not need to change dose of medication.  Please schedule this.  Cholesterol level, kidney function, and electrolytes continue to look good.  Any questions? Have a good day.

## 2020-01-28 NOTE — Progress Notes (Signed)
Thyroid recheck

## 2020-02-12 ENCOUNTER — Telehealth: Payer: Self-pay | Admitting: Nurse Practitioner

## 2020-02-12 ENCOUNTER — Other Ambulatory Visit: Payer: Self-pay | Admitting: Nurse Practitioner

## 2020-02-12 MED ORDER — PROPRANOLOL HCL 10 MG PO TABS: 10.0000 mg | ORAL_TABLET | Freq: Three times a day (TID) | ORAL | 4 refills | Status: DC

## 2020-02-12 NOTE — Telephone Encounter (Signed)
Sent this in

## 2020-02-12 NOTE — Telephone Encounter (Signed)
Pt asked if Jolene can send this med refill to pharmacy before 5pm today/ please advise   propranolol (INDERAL) 10 MG tablet

## 2020-02-12 NOTE — Telephone Encounter (Signed)
Routing to provider  

## 2020-02-17 NOTE — Telephone Encounter (Signed)
Received fax from [facility]. They are unable to send the request electronically. The request is for Propanolol however [facility]olene sent [facility] to [facility]/12/[facility].  Tried calling patient to verify that the medication was supposed to be [facility]. No answer. LVM for patient to return phone call.

## 2020-02-17 NOTE — Telephone Encounter (Signed)
Pt called back and stated [facility] has [facility] yesterday

## 2020-02-29 DIAGNOSIS — H02054 Trichiasis without entropian left upper eyelid: Secondary | ICD-10-CM | POA: Diagnosis not present

## 2020-03-01 ENCOUNTER — Other Ambulatory Visit: Payer: Self-pay

## 2020-03-01 ENCOUNTER — Other Ambulatory Visit: Payer: BC Managed Care – PPO

## 2020-03-01 DIAGNOSIS — E039 Hypothyroidism, unspecified: Secondary | ICD-10-CM | POA: Diagnosis not present

## 2020-03-02 ENCOUNTER — Other Ambulatory Visit: Payer: Self-pay | Admitting: Nurse Practitioner

## 2020-03-02 DIAGNOSIS — E039 Hypothyroidism, unspecified: Secondary | ICD-10-CM

## 2020-03-02 LAB — THYROID PANEL WITH TSH
Free Thyroxine Index: 3 (ref 1.2–4.9)
T3 Uptake Ratio: 29 % (ref 24–39)
T4, Total: 10.3 ug/dL (ref 4.5–12.0)
TSH: 0.269 u[IU]/mL — ABNORMAL LOW (ref 0.450–4.500)

## 2020-03-02 MED ORDER — LEVOTHYROXINE SODIUM 150 MCG PO TABS: 150.0000 ug | ORAL_TABLET | Freq: Every day | ORAL | 3 refills | Status: DC

## 2020-03-02 NOTE — Progress Notes (Signed)
Please let Danielle Sanford know her TSH is still on low side and T4 is creeping up, we do need to change her Levothyroxine dose to prevent her from being hyperthyroid.  I would like to reduce her dose to 150 MCG daily.  I will send this prescription in today and would like to recheck labs outpatient in 8 weeks.  If any questions let me know.  I am ordering labs and sending in script now.  Thank you.

## 2020-03-02 NOTE — Progress Notes (Signed)
Levothyroxine decrease to 150 MCG due to low TSH.  Repeat labs in 8 weeks.

## 2020-03-24 DIAGNOSIS — H02055 Trichiasis without entropian left lower eyelid: Secondary | ICD-10-CM | POA: Diagnosis not present

## 2020-03-24 DIAGNOSIS — D869 Sarcoidosis, unspecified: Secondary | ICD-10-CM | POA: Diagnosis not present

## 2020-04-12 ENCOUNTER — Other Ambulatory Visit: Payer: Self-pay | Admitting: Nurse Practitioner

## 2020-04-12 ENCOUNTER — Telehealth: Payer: Self-pay | Admitting: Nurse Practitioner

## 2020-04-12 NOTE — Telephone Encounter (Signed)
Error

## 2020-04-19 DIAGNOSIS — D869 Sarcoidosis, unspecified: Secondary | ICD-10-CM | POA: Diagnosis not present

## 2020-04-20 ENCOUNTER — Telehealth: Payer: Self-pay

## 2020-04-20 LAB — COMPREHENSIVE METABOLIC PANEL
ALT: 17 IU/L (ref 0–32)
AST: 17 IU/L (ref 0–40)
Albumin/Globulin Ratio: 1.3 (ref 1.2–2.2)
Albumin: 4.1 g/dL (ref 3.8–4.9)
Alkaline Phosphatase: 113 IU/L (ref 48–121)
BUN/Creatinine Ratio: 12 (ref 9–23)
BUN: 11 mg/dL (ref 6–24)
Bilirubin Total: 0.4 mg/dL (ref 0.0–1.2)
CO2: 27 mmol/L (ref 20–29)
Calcium: 9.1 mg/dL (ref 8.7–10.2)
Chloride: 102 mmol/L (ref 96–106)
Creatinine, Ser: 0.89 mg/dL (ref 0.57–1.00)
GFR calc Af Amer: 82 mL/min/{1.73_m2} (ref >59)
GFR calc non Af Amer: 71 mL/min/{1.73_m2} (ref >59)
Globulin, Total: 3.1 g/dL (ref 1.5–4.5)
Glucose: 94 mg/dL (ref 65–99)
Potassium: 4 mmol/L (ref 3.5–5.2)
Sodium: 140 mmol/L (ref 134–144)
Total Protein: 7.2 g/dL (ref 6.0–8.5)

## 2020-04-20 LAB — CBC WITH DIFFERENTIAL/PLATELET
Basophils Absolute: 0 10*3/uL (ref 0.0–0.2)
Basos: 0 %
EOS (ABSOLUTE): 0.2 10*3/uL (ref 0.0–0.4)
Eos: 6 %
Hematocrit: 38.7 % (ref 34.0–46.6)
Hemoglobin: 12.6 g/dL (ref 11.1–15.9)
Immature Grans (Abs): 0 10*3/uL (ref 0.0–0.1)
Immature Granulocytes: 0 %
Lymphocytes Absolute: 0.4 10*3/uL — ABNORMAL LOW (ref 0.7–3.1)
Lymphs: 11 %
MCH: 25.9 pg — ABNORMAL LOW (ref 26.6–33.0)
MCHC: 32.6 g/dL (ref 31.5–35.7)
MCV: 80 fL (ref 79–97)
Monocytes Absolute: 0.4 10*3/uL (ref 0.1–0.9)
Monocytes: 11 %
Neutrophils Absolute: 2.6 10*3/uL (ref 1.4–7.0)
Neutrophils: 72 %
Platelets: 167 10*3/uL (ref 150–450)
RBC: 4.86 x10E6/uL (ref 3.77–5.28)
RDW: 14.7 % (ref 11.7–15.4)
WBC: 3.7 10*3/uL (ref 3.4–10.8)

## 2020-04-20 LAB — LIPID PANEL
Chol/HDL Ratio: 4.9 ratio — ABNORMAL HIGH (ref 0.0–4.4)
Cholesterol, Total: 161 mg/dL (ref 100–199)
HDL: 33 mg/dL — ABNORMAL LOW (ref >39)
LDL Chol Calc (NIH): 93 mg/dL (ref 0–99)
Triglycerides: 203 mg/dL — ABNORMAL HIGH (ref 0–149)
VLDL Cholesterol Cal: 35 mg/dL (ref 5–40)

## 2020-04-20 MED ORDER — HYDROXYCHLOROQUINE SULFATE 200 MG PO TABS
200.0000 mg | ORAL_TABLET | Freq: Two times a day (BID) | ORAL | 0 refills | Status: DC
Start: 2020-04-20 — End: 2020-10-24

## 2020-04-20 NOTE — Telephone Encounter (Signed)
Advised pt of labs.  Advised pt we would send her refill of Plaquenil 200mg  1 po bid to [facility].

## 2020-04-20 NOTE — Telephone Encounter (Signed)
Tried to call patient with lab results. No answer and VM full.

## 2020-05-11 ENCOUNTER — Other Ambulatory Visit: Payer: Self-pay | Admitting: Nurse Practitioner

## 2020-05-11 MED ORDER — LEVOTHYROXINE SODIUM 150 MCG PO TABS: 150.0000 ug | ORAL_TABLET | Freq: Every day | ORAL | 1 refills | Status: DC

## 2020-05-11 NOTE — Telephone Encounter (Signed)
Medication Refill - Medication:  levothyroxine (SYNTHROID) 150 MCG tablet [[id number]]  Has the patient contacted their pharmacy? No. (Agent: If no, request that the patient contact the pharmacy for the refill.) (Agent: If yes, when and what did the pharmacy advise?)  Preferred Pharmacy (with phone number or street name): [facility] [address]    Agent: Please be advised that RX refills may take up to 3 business days. We ask that you follow-up with your pharmacy.

## 2020-05-11 NOTE — Telephone Encounter (Signed)
Called and spoke to patient. She states that she needs the prescription sent to [facility].

## 2020-05-24 DIAGNOSIS — Z1231 Encounter for screening mammogram for malignant neoplasm of breast: Secondary | ICD-10-CM | POA: Diagnosis not present

## 2020-05-24 DIAGNOSIS — Z01419 Encounter for gynecological examination (general) (routine) without abnormal findings: Secondary | ICD-10-CM | POA: Diagnosis not present

## 2020-05-24 DIAGNOSIS — Z87410 Personal history of cervical dysplasia: Secondary | ICD-10-CM | POA: Diagnosis not present

## 2020-05-24 DIAGNOSIS — Z8742 Personal history of other diseases of the female genital tract: Secondary | ICD-10-CM | POA: Diagnosis not present

## 2020-05-27 ENCOUNTER — Other Ambulatory Visit: Payer: Self-pay | Admitting: Nurse Practitioner

## 2020-05-27 MED ORDER — PROPRANOLOL HCL 10 MG PO TABS: 10.0000 mg | ORAL_TABLET | Freq: Three times a day (TID) | ORAL | 4 refills | Status: DC

## 2020-05-27 NOTE — Telephone Encounter (Signed)
Medication Refill - Medication: propranolol  Has the patient contacted their pharmacy? No. (Agent: If no, request that the patient contact the pharmacy for the refill.) (Agent: If yes, when and what did the pharmacy advise?)  Preferred Pharmacy (with phone number or street name):   Agent: Please be advised that RX refills may take up to 3 business days. We ask that you follow-up with your pharmacy.[facility] - [place], [place] - [address]

## 2020-05-27 NOTE — Telephone Encounter (Signed)
[facility] but needs RX sent to mail order please.

## 2020-07-08 ENCOUNTER — Ambulatory Visit (INDEPENDENT_AMBULATORY_CARE_PROVIDER_SITE_OTHER): Payer: BC Managed Care – PPO | Admitting: Nurse Practitioner

## 2020-07-08 ENCOUNTER — Other Ambulatory Visit: Payer: Self-pay

## 2020-07-08 ENCOUNTER — Encounter: Payer: Self-pay | Admitting: Nurse Practitioner

## 2020-07-08 VITALS — BP 110/70 | HR 78 | Temp 98.0°F | Resp 16 | Ht 67.0 in | Wt 210.0 lb

## 2020-07-08 DIAGNOSIS — R87612 Low grade squamous intraepithelial lesion on cytologic smear of cervix (LGSIL): Secondary | ICD-10-CM | POA: Diagnosis not present

## 2020-07-08 DIAGNOSIS — E6609 Other obesity due to excess calories: Secondary | ICD-10-CM

## 2020-07-08 DIAGNOSIS — N898 Other specified noninflammatory disorders of vagina: Secondary | ICD-10-CM | POA: Insufficient documentation

## 2020-07-08 DIAGNOSIS — Z6831 Body mass index (BMI) 31.0-31.9, adult: Secondary | ICD-10-CM | POA: Diagnosis not present

## 2020-07-08 DIAGNOSIS — R87619 Unspecified abnormal cytological findings in specimens from cervix uteri: Secondary | ICD-10-CM | POA: Insufficient documentation

## 2020-07-08 MED ORDER — VALACYCLOVIR HCL 1 G PO TABS
1000.0000 mg | ORAL_TABLET | Freq: Two times a day (BID) | ORAL | 0 refills | Status: DC
Start: 2020-07-08 — End: 2020-07-18

## 2020-07-08 NOTE — Assessment & Plan Note (Signed)
Acute, reports history of herpes.  No maintenance medications for this.  Last outbreak she believes was 20 years ago.  Will obtain labs today and send in script for Valtrex x 7 days treatment.  To return to office if ongoing or worsening symptoms.

## 2020-07-08 NOTE — Progress Notes (Signed)
BP 110/70 (BP Location: Left Arm, Patient Position: Sitting, Cuff Size: Normal)   Pulse 78   Temp 98 F (36.7 C) (Oral)   Resp 16   Ht 5\' 7"  (1.702 m)   Wt 210 lb (95.3 kg)   LMP  (LMP Unknown)   SpO2 98%   BMI 32.89 kg/m    Subjective:    Patient ID: Danielle Sanford, female    DOB: 05-Jun-1961, 59 y.o.   MRN: [id number]  HPI: Danielle Sanford is a 59 y.o. female  Chief Complaint  Patient presents with  . Vaginal Itching   VAGINAL IRRITATION Has history of herpes -- reports current "bumps" with burning and itching.  Has been present for two months.  Initially diagnosed with herpes in 2000.  Has not had outbreak in long while.    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 Duration: months Pruritus: yes Dysuria: no Malodorous: no Urinary frequency: no Fevers: no Abdominal pain: no  Sexual activity: monogamous History of sexually transmitted diseases: yes Recent antibiotic use: no Context: has had before  Treatments attempted: none  Relevant past medical, surgical, family and social history reviewed and updated as indicated. Interim medical history since our last visit reviewed. Allergies and medications reviewed and updated.  Review of Systems  Constitutional: Negative for activity change, appetite change, diaphoresis, fatigue and fever.  Respiratory: Negative for cough, chest tightness and shortness of breath.   Cardiovascular: Negative for chest pain, palpitations and leg swelling.  Gastrointestinal: Negative.   Genitourinary: Negative for vaginal bleeding, vaginal discharge and vaginal pain.  Neurological: Negative.   Psychiatric/Behavioral: Negative.     Per HPI unless specifically indicated above     Objective:    BP 110/70 (BP Location: Left Arm, Patient Position: Sitting, Cuff Size: Normal)   Pulse 78   Temp 98 F (36.7 C) (Oral)   Resp 16   Ht 5\' 7"   (1.702 m)   Wt 210 lb (95.3 kg)   LMP  (LMP Unknown)   SpO2 98%   BMI 32.89 kg/m   Wt Readings from Last 3 Encounters:  07/08/20 210 lb (95.3 kg)  01/27/20 209 lb (94.8 kg)  09/02/18 213 lb 7 oz (96.8 kg)    Physical Exam Vitals and nursing note reviewed.  Constitutional:      General: She is awake. She is not in acute distress.    Appearance: She is well-developed and well-groomed. She is obese. She is not ill-appearing.  HENT:     Head: Normocephalic.     Right Ear: Hearing normal.     Left Ear: Hearing normal.  Eyes:     General: Lids are normal.        Right eye: No discharge.        Left eye: No discharge.     Conjunctiva/sclera: Conjunctivae normal.     Pupils: Pupils are equal, round, and reactive to light.  Cardiovascular:     Rate and Rhythm: Normal rate and regular rhythm.     Heart sounds: Normal heart sounds. No murmur heard.  No gallop.   Pulmonary:     Effort: Pulmonary effort is normal. No accessory muscle usage or respiratory distress.     Breath sounds: Normal breath sounds.  Abdominal:     General: Bowel sounds are normal.     Palpations: Abdomen is soft.  Genitourinary:    Pubic Area:  No rash.      Labia:        Right: Lesion present.        Left: Lesion present.      Comments: Chaperone declined -- bilateral inner labia with small, raised vesicles with no drainage, mild erythema around vesicles.  Excoriation noted to lower aspect labia bilaterally. Musculoskeletal:     Cervical back: Normal range of motion and neck supple.     Right lower leg: No edema.     Left lower leg: No edema.  Skin:    General: Skin is warm and dry.  Neurological:     Mental Status: She is alert and oriented to person, place, and time.  Psychiatric:        Attention and Perception: Attention normal.        Mood and Affect: Mood normal.        Speech: Speech normal.        Behavior: Behavior normal. Behavior is cooperative.        Thought Content: Thought content  normal.     Results for orders placed or performed in visit on 03/01/20  Thyroid Panel With TSH  Result Value Ref Range   TSH 0.269 (L) 0.450 - 4.500 uIU/mL   T4, Total 10.3 4.5 - 12.0 ug/dL   T3 Uptake Ratio 29 24 - 39 %   Free Thyroxine Index 3.0 1.2 - 4.9      Assessment & Plan:   Problem List Items Addressed This Visit      Other   Vaginal lesion - Primary    Acute, reports history of herpes.  No maintenance medications for this.  Last outbreak she believes was 20 years ago.  Will obtain labs today and send in script for Valtrex x 7 days treatment.  To return to office if ongoing or worsening symptoms.      Relevant Orders   HSV(herpes simplex vrs) 1+2 ab-IgG   Abnormal Pap smear of cervix    Recently obtained by GYN, have recommended she scheduled biopsy and follow-up with GYN as soon as possible for further recommendations and guidance.  Discussed at length with her.          Follow up plan: Return if symptoms worsen or fail to improve.

## 2020-07-08 NOTE — Assessment & Plan Note (Signed)
Recently obtained by GYN, have recommended she scheduled biopsy and follow-up with GYN as soon as possible for further recommendations and guidance.  Discussed at length with her.

## 2020-07-08 NOTE — Assessment & Plan Note (Signed)
Recommended eating smaller high protein, low fat meals more frequently and exercising 30 mins a day 5 times a week with a goal of 10-15lb weight loss in the next 3 months. Patient voiced their understanding and motivation to adhere to these recommendations.  

## 2020-07-08 NOTE — Patient Instructions (Signed)

## 2020-07-09 LAB — HSV(HERPES SIMPLEX VRS) I + II AB-IGG
HSV 1 Glycoprotein G Ab, IgG: <0.91 index (ref 0.00–0.90)
HSV 2 IgG, Type Spec: 14 index — ABNORMAL HIGH (ref 0.00–0.90)

## 2020-07-10 NOTE — Progress Notes (Signed)
Please let Danielle Sanford know her labs did return showing positive for genital herpes, as she had noted and was expected.  Recommend she complete Valtrex course for current outbreak and return to Dr. Dalbert Garnet her gynecologist for biopsy recommendations and discussion as we discussed.  Have a great day!! Keep being awesome!!  Thank you for allowing me to participate in your care. Kindest regards, Darrian Goodwill

## 2020-07-13 ENCOUNTER — Ambulatory Visit: Payer: BC Managed Care – PPO

## 2020-07-18 ENCOUNTER — Ambulatory Visit: Payer: Self-pay

## 2020-07-18 ENCOUNTER — Other Ambulatory Visit: Payer: Self-pay | Admitting: Nurse Practitioner

## 2020-07-18 MED ORDER — VALACYCLOVIR HCL 1 G PO TABS: 1000.0000 mg | ORAL_TABLET | Freq: Two times a day (BID) | ORAL | 0 refills | Status: DC

## 2020-07-18 NOTE — Telephone Encounter (Signed)
Called pt and relayed Danielle Sanford's message. Pt states that she will try it for another 7 days and see how it does, if not she may need something else, she wants to clear it up before going to gyn

## 2020-07-18 NOTE — Telephone Encounter (Signed)
Pt. Reports she took the Valtrex, "but the rash has not completely cleared up. I think I need some more medicine." Please advise pt. Answer Assessment - Initial Assessment Questions 1. NAME of MEDICATION: "What medicine are you calling about?"     Valtrex 2. QUESTION: "What is your question?" (e.g., medication refill, side effect)     The medicine helped, but did not clear it up. 3. PRESCRIBING HCP: "Who prescribed it?" Reason: if prescribed by specialist, call should be referred to that group.     Ms. Harvest Dark 4. SYMPTOMS: "Do you have any symptoms?"     Rash hasn't cleared up 5. SEVERITY: If symptoms are present, ask "Are they mild, moderate or severe?"     Mild 6. PREGNANCY:  "Is there any chance that you are pregnant?" "When was your last menstrual period?"     No  Protocols used: MEDICATION QUESTION CALL-A-AH

## 2020-07-18 NOTE — Telephone Encounter (Signed)
Sent in refill on Valtrex and also want her to reach out to her GYN ASAP to schedule visit and be seen for further testing as we discussed.  Thanks.

## 2020-07-20 ENCOUNTER — Telehealth: Payer: Self-pay | Admitting: *Deleted

## 2020-07-20 NOTE — Telephone Encounter (Signed)
Agree, recommend virtual visit as well and we could obtain Covid test here at clinic.  Please schedule.

## 2020-07-20 NOTE — Telephone Encounter (Signed)
Pt called stating she did a COVID home test on 07/19/20; she had multiple exposures with other family members; the pt has cough, fever in the 99 range on 07/18/20;explained to pt to presume she is indeed positive but she may want to confirm the result with a test from a clinical agency; tier of symptoms reviewed;  Instructions given to pt:  Self-Isolation, Ending Isolation, ED/UC . Instruct the patient to remain in self-quarantine until they meet the "Non-Test Criteria for Ending Self-Isolation". Non-Test Criteria for Ending Self-Isolation All persons with fever and respiratory symptoms should isolate themselves until ALL conditions listed below are met: - at least 10 days since symptoms onset - AND 3 consecutive days fever free without antipyretics (acetaminophen [Tylenol] or ibuprofen [Advil]) - AND improvement in respiratory symptoms . If the patient develops respiratory issues/distress, seek medical care in the Emergency Department, call 911, reports symptoms and report COVID-19 positive test. Patient Instructions . Instruct the patient to continue to utilize over-the-counter medications for fever (ibuprofen and/or Tylenol) and cough (cough medicine and/or sore throat lozenges). Wannetta Sender the patient to wear a mask around people and follow good infection prevention techniques. . Patient to should only leave home to seek medical care. . Instruct patient to send family for food, prescriptions or medicines; or use delivery service.  . If the patient must leave the home, they must wear a mask in public. . Instruct patient to limit contact with immediate family members or caregivers in the home, and use mask, social distancing, and handwashing to decrease risk to patients. o Please continue good preventive care measures, including frequent hand washing, avoid touching your face, cover coughs/sneezes with tissue or into elbow, stay out of crowds and keep a 6-foot distance from others.   . Instruct  patient and family to clean hard surfaces touched by patient frequently with household cleaning products.  Offered to schedule pt for COVID testing thru [facility] and virtual appt with Marnee Guarneri, [facility]; the pt verbalized understanding but says she states she declined testing through [facility] and will go to [facility]; she also says she will call back to schedule appt with Marnee Guarneri; will route to office for notification of encounter.

## 2020-07-20 NOTE — Telephone Encounter (Signed)
Called pt scheduled virtual at 10:40 tomorrow

## 2020-07-20 NOTE — Telephone Encounter (Signed)
Noted  

## 2020-07-21 ENCOUNTER — Encounter: Payer: Self-pay | Admitting: Nurse Practitioner

## 2020-07-21 ENCOUNTER — Other Ambulatory Visit: Payer: Self-pay | Admitting: Nurse Practitioner

## 2020-07-21 ENCOUNTER — Telehealth (INDEPENDENT_AMBULATORY_CARE_PROVIDER_SITE_OTHER): Payer: BC Managed Care – PPO | Admitting: Nurse Practitioner

## 2020-07-21 DIAGNOSIS — Z20822 Contact with and (suspected) exposure to covid-19: Secondary | ICD-10-CM

## 2020-07-21 DIAGNOSIS — Z8616 Personal history of COVID-19: Secondary | ICD-10-CM

## 2020-07-21 DIAGNOSIS — R509 Fever, unspecified: Secondary | ICD-10-CM | POA: Diagnosis not present

## 2020-07-21 HISTORY — DX: Personal history of COVID-19: Z86.16

## 2020-07-21 NOTE — Assessment & Plan Note (Signed)
Acute with symptoms starting 07/17/20 -- direct exposure to x 3 Covid positive people at funeral.  Had at home test that returned positive, but threw this away, is going to perform another one and take picture + come to office for Covid testing.  Discussed MAB treatment with her and she is interested in infusion, educated her on side effects and process.  Will schedule her for Saturday at 1030 and she is aware to bring at home results with her, in case office testing not returned by then.  At this time recommend self quarantine for 10 days.  May use OTC medications for symptom management at this time, if worsening symptoms notify provider or go directly to ER.  Plan on follow-up in office in 3 weeks.

## 2020-07-21 NOTE — Patient Instructions (Signed)
COVID-19 COVID-19 is a respiratory infection that is caused by a virus called severe acute respiratory syndrome coronavirus 2 (SARS-CoV-2). The disease is also known as coronavirus disease or novel coronavirus. In some people, the virus may not cause any symptoms. In others, it may cause a serious infection. The infection can get worse quickly and can lead to complications, such as:  Pneumonia, or infection of the lungs.  Acute respiratory distress syndrome or ARDS. This is a condition in which fluid build-up in the lungs prevents the lungs from filling with air and passing oxygen into the blood.  Acute respiratory failure. This is a condition in which there is not enough oxygen passing from the lungs to the body or when carbon dioxide is not passing from the lungs out of the body.  Sepsis or septic shock. This is a serious bodily reaction to an infection.  Blood clotting problems.  Secondary infections due to bacteria or fungus.  Organ failure. This is when your body's organs stop working. The virus that causes COVID-19 is contagious. This means that it can spread from person to person through droplets from coughs and sneezes (respiratory secretions). What are the causes? This illness is caused by a virus. You may catch the virus by:  Breathing in droplets from an infected person. Droplets can be spread by a person breathing, speaking, singing, coughing, or sneezing.  Touching something, like a table or a doorknob, that was exposed to the virus (contaminated) and then touching your mouth, nose, or eyes. What increases the risk? Risk for infection You are more likely to be infected with this virus if you:  Are within 6 feet (2 meters) of a person with COVID-19.  Provide care for or live with a person who is infected with COVID-19.  Spend time in crowded indoor spaces or live in shared housing. Risk for serious illness You are more likely to become seriously ill from the virus if you:   Are 50 years of age or older. The higher your age, the more you are at risk for serious illness.  Live in a nursing home or long-term care facility.  Have cancer.  Have a long-term (chronic) disease such as: ? Chronic lung disease, including chronic obstructive pulmonary disease or asthma. ? A long-term disease that lowers your body's ability to fight infection (immunocompromised). ? Heart disease, including heart failure, a condition in which the arteries that lead to the heart become narrow or blocked (coronary artery disease), a disease which makes the heart muscle thick, weak, or stiff (cardiomyopathy). ? Diabetes. ? Chronic kidney disease. ? Sickle cell disease, a condition in which red blood cells have an abnormal "sickle" shape. ? Liver disease.  Are obese. What are the signs or symptoms? Symptoms of this condition can range from mild to severe. Symptoms may appear any time from 2 to 14 days after being exposed to the virus. They include:  A fever or chills.  A cough.  Difficulty breathing.  Headaches, body aches, or muscle aches.  Runny or stuffy (congested) nose.  A sore throat.  New loss of taste or smell. Some people may also have stomach problems, such as nausea, vomiting, or diarrhea. Other people may not have any symptoms of COVID-19. How is this diagnosed? This condition may be diagnosed based on:  Your signs and symptoms, especially if: ? You live in an area with a COVID-19 outbreak. ? You recently traveled to or from an area where the virus is common. ? You   provide care for or live with a person who was diagnosed with COVID-19. ? You were exposed to a person who was diagnosed with COVID-19.  A physical exam.  Lab tests, which may include: ? Taking a sample of fluid from the back of your nose and throat (nasopharyngeal fluid), your nose, or your throat using a swab. ? A sample of mucus from your lungs (sputum). ? Blood tests.  Imaging tests, which  may include, X-rays, CT scan, or ultrasound. How is this treated? At present, there is no medicine to treat COVID-19. Medicines that treat other diseases are being used on a trial basis to see if they are effective against COVID-19. Your health care provider will talk with you about ways to treat your symptoms. For most people, the infection is mild and can be managed at home with rest, fluids, and over-the-counter medicines. Treatment for a serious infection usually takes places in a hospital intensive care unit (ICU). It may include one or more of the following treatments. These treatments are given until your symptoms improve.  Receiving fluids and medicines through an IV.  Supplemental oxygen. Extra oxygen is given through a tube in the nose, a face mask, or a hood.  Positioning you to lie on your stomach (prone position). This makes it easier for oxygen to get into the lungs.  Continuous positive airway pressure (CPAP) or bi-level positive airway pressure (BPAP) machine. This treatment uses mild air pressure to keep the airways open. A tube that is connected to a motor delivers oxygen to the body.  Ventilator. This treatment moves air into and out of the lungs by using a tube that is placed in your windpipe.  Tracheostomy. This is a procedure to create a hole in the neck so that a breathing tube can be inserted.  Extracorporeal membrane oxygenation (ECMO). This procedure gives the lungs a chance to recover by taking over the functions of the heart and lungs. It supplies oxygen to the body and removes carbon dioxide. Follow these instructions at home: Lifestyle  If you are sick, stay home except to get medical care. Your health care provider will tell you how long to stay home. Call your health care provider before you go for medical care.  Rest at home as told by your health care provider.  Do not use any products that contain nicotine or tobacco, such as cigarettes, e-cigarettes, and  chewing tobacco. If you need help quitting, ask your health care provider.  Return to your normal activities as told by your health care provider. Ask your health care provider what activities are safe for you. General instructions  Take over-the-counter and prescription medicines only as told by your health care provider.  Drink enough fluid to keep your urine pale yellow.  Keep all follow-up visits as told by your health care provider. This is important. How is this prevented?  There is no vaccine to help prevent COVID-19 infection. However, there are steps you can take to protect yourself and others from this virus. To protect yourself:   Do not travel to areas where COVID-19 is a risk. The areas where COVID-19 is reported change often. To identify high-risk areas and travel restrictions, check the CDC travel website: wwwnc.cdc.gov/travel/notices  If you live in, or must travel to, an area where COVID-19 is a risk, take precautions to avoid infection. ? Stay away from people who are sick. ? Wash your hands often with soap and water for 20 seconds. If soap and water   are not available, use an alcohol-based hand sanitizer. ? Avoid touching your mouth, face, eyes, or nose. ? Avoid going out in public, follow guidance from your state and local health authorities. ? If you must go out in public, wear a cloth face covering or face mask. Make sure your mask covers your nose and mouth. ? Avoid crowded indoor spaces. Stay at least 6 feet (2 meters) away from others. ? Disinfect objects and surfaces that are frequently touched every day. This may include:  Counters and tables.  Doorknobs and light switches.  Sinks and faucets.  Electronics, such as phones, remote controls, keyboards, computers, and tablets. To protect others: If you have symptoms of COVID-19, take steps to prevent the virus from spreading to others.  If you think you have a COVID-19 infection, contact your health care  provider right away. Tell your health care team that you think you may have a COVID-19 infection.  Stay home. Leave your house only to seek medical care. Do not use public transport.  Do not travel while you are sick.  Wash your hands often with soap and water for 20 seconds. If soap and water are not available, use alcohol-based hand sanitizer.  Stay away from other members of your household. Let healthy household members care for children and pets, if possible. If you have to care for children or pets, wash your hands often and wear a mask. If possible, stay in your own room, separate from others. Use a different bathroom.  Make sure that all people in your household wash their hands well and often.  Cough or sneeze into a tissue or your sleeve or elbow. Do not cough or sneeze into your hand or into the air.  Wear a cloth face covering or face mask. Make sure your mask covers your nose and mouth. Where to find more information  Centers for Disease Control and Prevention: www.cdc.gov/coronavirus/2019-ncov/index.html  World Health Organization: www.who.int/health-topics/coronavirus Contact a health care provider if:  You live in or have traveled to an area where COVID-19 is a risk and you have symptoms of the infection.  You have had contact with someone who has COVID-19 and you have symptoms of the infection. Get help right away if:  You have trouble breathing.  You have pain or pressure in your chest.  You have confusion.  You have bluish lips and fingernails.  You have difficulty waking from sleep.  You have symptoms that get worse. These symptoms may represent a serious problem that is an emergency. Do not wait to see if the symptoms will go away. Get medical help right away. Call your local emergency services (911 in the [place] Do not drive yourself to the hospital. Let the emergency medical personnel know if you think you have COVID-19. Summary  COVID-19 is a  respiratory infection that is caused by a virus. It is also known as coronavirus disease or novel coronavirus. It can cause serious infections, such as pneumonia, acute respiratory distress syndrome, acute respiratory failure, or sepsis.  The virus that causes COVID-19 is contagious. This means that it can spread from person to person through droplets from breathing, speaking, singing, coughing, or sneezing.  You are more likely to develop a serious illness if you are 50 years of age or older, have a weak immune system, live in a nursing home, or have chronic disease.  There is no medicine to treat COVID-19. Your health care provider will talk with you about ways to treat your symptoms.    Take steps to protect yourself and others from infection. Wash your hands often and disinfect objects and surfaces that are frequently touched every day. Stay away from people who are sick and wear a mask if you are sick. This information is not intended to replace advice given to you by your health care provider. Make sure you discuss any questions you have with your health care provider. Document Revised: 07/03/2019 Document Reviewed: 10/09/2018 Elsevier Patient Education  2020 [employer]  

## 2020-07-21 NOTE — Addendum Note (Signed)
Addended by: Pablo Ledger on: 07/21/2020 04:51 PM   Modules accepted: Orders

## 2020-07-21 NOTE — Progress Notes (Signed)
LMP  (LMP Unknown)    Subjective:    Patient ID: Danielle Sanford, female    DOB: [birth year], 59 y.o.   MRN: [id number]  HPI: Danielle Sanford is a 59 y.o. female  Chief Complaint  Patient presents with  . Covid Positive    took at home covid test on tuesday afternoon and was positive. Was exposed to Covid, took mother in Social worker to [facility] for Covid, sister in Social worker and cousin have also tested postitive.  Cough, running a temp, nasal congestrion, still have sense of taste.    . This visit was completed via telephone due to the restrictions of the COVID-19 pandemic. All issues as above were discussed and addressed but no physical exam was performed. If it was felt that the patient should be evaluated in the office, they were directed there. The patient verbally consented to this visit. Patient was unable to complete an audio/visual visit due to Technical difficulties,Lack of internet. Due to the catastrophic nature of the COVID-19 pandemic, this visit was done through audio contact only. . Location of the patient: home . Location of the provider: work . Those involved with this call:  . Provider: Aura Dials, DNP . CMA: Wilhemena Durie, CMA . Front Desk/Registration: Harriet Pho  . Time spent on call: 25 minutes on the phone discussing health concerns. 15 minutes total spent in review of patient's record and preparation of their chart.  . I verified patient identity using two factors (patient name and date of birth). Patient consents verbally to being seen via telemedicine visit today.    UPPER RESPIRATORY TRACT INFECTION Presents today for positive at-home Covid test on Tuesday --- 07/19/20.  She reports she was exposed to Covid, her MIL is currently at [facility] for Covid and SIL and cousin tested positive. Presently she has a cough, fever, and congestion.  No loss of taste or smell.  Symptoms started with cough on 07/17/20 -- was at funeral since her FIL passed away.  Had Covid  vaccinations in April, last 01/14/20. Fever: yes Cough: yes Shortness of breath: no Wheezing: no Chest pain: no Chest tightness: no Chest congestion: yes Nasal congestion: yes Runny nose: no Post nasal drip: no Sneezing: no Sore throat: no Swollen glands: no Sinus pressure: yes Headache: no Face pain: no Toothache: no Ear pain: none Ear pressure: none Eyes red/itching:no Eye drainage/crusting: no  Vomiting: no Rash: no Fatigue: yes Sick contacts: yes Strep contacts: no  Context: stable Recurrent sinusitis: no Relief with OTC cold/cough medications: yes  Treatments attempted: Tylenol    Relevant past medical, surgical, family and social history reviewed and updated as indicated. Interim medical history since our last visit reviewed. Allergies and medications reviewed and updated.  Review of Systems  Constitutional: Positive for chills, fatigue and fever. Negative for activity change, appetite change and diaphoresis.  HENT: Positive for congestion and sinus pressure. Negative for ear discharge, ear pain, facial swelling, postnasal drip, rhinorrhea, sinus pain, sneezing, sore throat and voice change.   Eyes: Negative for pain and visual disturbance.  Respiratory: Positive for cough and chest tightness. Negative for shortness of breath and wheezing.   Cardiovascular: Negative for chest pain, palpitations and leg swelling.  Gastrointestinal: Negative for abdominal distention, abdominal pain, constipation, diarrhea, nausea and vomiting.  Endocrine: Negative.   Musculoskeletal: Positive for myalgias.  Neurological: Negative for dizziness, numbness and headaches.  Psychiatric/Behavioral: Negative.     Per HPI unless specifically indicated above     Objective:  LMP  (LMP Unknown)   Wt Readings from Last 3 Encounters:  07/08/20 210 lb (95.3 kg)  01/27/20 209 lb (94.8 kg)  09/02/18 213 lb 7 oz (96.8 kg)    Physical Exam   Unable to perform due to telephone visit  only.  Results for orders placed or performed in visit on 07/08/20  HSV(herpes simplex vrs) 1+2 ab-IgG  Result Value Ref Range   HSV 1 Glycoprotein G Ab, IgG <0.91 0.00 - 0.90 index   HSV 2 IgG, Type Spec 14.00 (H) 0.00 - 0.90 index      Assessment & Plan:   Problem List Items Addressed This Visit      Other   Fever with exposure to COVID-19 virus - Primary    Acute with symptoms starting 07/17/20 -- direct exposure to x 3 Covid positive people at funeral.  Had at home test that returned positive, but threw this away, is going to perform another one and take picture + come to office for Covid testing.  Discussed MAB treatment with her and she is interested in infusion, educated her on side effects and process.  Will schedule her for Saturday at 1030 and she is aware to bring at home results with her, in case office testing not returned by then.  At this time recommend self quarantine for 10 days.  May use OTC medications for symptom management at this time, if worsening symptoms notify provider or go directly to ER.  Plan on follow-up in office in 3 weeks.      Relevant Orders   Novel Coronavirus, NAA ([facility])      I discussed the assessment and treatment plan with the patient. The patient was provided an opportunity to ask questions and all were answered. The patient agreed with the plan and demonstrated an understanding of the instructions.   The patient was advised to call back or seek an in-person evaluation if the symptoms worsen or if the condition fails to improve as anticipated.   I provided 21+ minutes of time during this encounter.  Follow up plan: Return in about 3 weeks (around 08/11/2020) for Covid f/u.

## 2020-07-21 NOTE — Progress Notes (Signed)
  I connected by phone with [name] on 07/21/2020 at 11:14 AM to discuss the potential use of an new treatment for mild to moderate COVID-19 viral infection in non-hospitalized patients.  Symptoms started 07/17/20 -- she was vaccinated in April.  This patient is a 59 y.o. female that meets the FDA criteria for Emergency Use Authorization of bamlanivimab:  Has a (+) direct SARS-CoV-2 viral test result  Has mild or moderate COVID-19   Is ? 60 years of age and weighs ? 40 kg  Is NOT hospitalized due to COVID-19  Is NOT requiring oxygen therapy or requiring an increase in baseline oxygen flow rate due to COVID-19  Is within 10 days of symptom onset  Has at least one of the high risk factor(s) for progression to severe COVID-19 and/or hospitalization as defined in EUA.  Specific high risk criteria : BMI > 25  Reviewed patient chronic problem list, which is currently managed by their PCP.  I have spoken and communicated the following to the patient or parent/caregiver:  1. FDA has authorized the emergency use of bamlanivimab for the treatment of mild to moderate COVID-19 in adults and pediatric patients with positive results of direct SARS-CoV-2 viral testing who are 53 years of age and older weighing at least 40 kg, and who are at high risk for progressing to severe COVID-19 and/or hospitalization.  2. The significant known and potential risks and benefits of bamlanivimab, and the extent to which such potential risks and benefits are unknown.  3. Information on available alternative treatments and the risks and benefits of those alternatives, including clinical trials.  4. Patients treated with bamlanivimab should continue to self-isolate and use infection control measures (e.g., wear mask, isolate, social distance, avoid sharing personal items, clean and disinfect "high touch" surfaces, and frequent handwashing) according to CDC guidelines.   5. The patient or parent/caregiver  has the option to accept or refuse bamlanivimab.  After reviewing this information with the patient, the patient has agreed to receive one of the available covid 19 monoclonal antibodies and will be provided an appropriate fact sheet prior to infusion.  Danielle Sanford T [place] 07/21/2020 11:14 AM

## 2020-07-22 ENCOUNTER — Other Ambulatory Visit: Payer: Self-pay | Admitting: Nurse Practitioner

## 2020-07-22 LAB — SARS-COV-2, NAA 2 DAY TAT

## 2020-07-22 LAB — NOVEL CORONAVIRUS, NAA: SARS-CoV-2, NAA: DETECTED — AB

## 2020-07-22 MED ORDER — LEVOTHYROXINE SODIUM 150 MCG PO TABS
150.0000 ug | ORAL_TABLET | Freq: Every day | ORAL | 4 refills | Status: DC
Start: 2020-07-22 — End: 2020-12-06

## 2020-07-22 NOTE — Progress Notes (Signed)
Patient made aware of results and is scheduled for MAB tomorrow.

## 2020-07-23 ENCOUNTER — Ambulatory Visit (HOSPITAL_COMMUNITY)
Admission: RE | Admit: 2020-07-23 | Discharge: 2020-07-23 | Disposition: A | Discharge status: Home / Self Care | Payer: BC Managed Care – PPO | Source: Ambulatory Visit | Attending: Pulmonary Disease | Admitting: Pulmonary Disease

## 2020-07-23 DIAGNOSIS — U071 COVID-19: Secondary | ICD-10-CM | POA: Diagnosis not present

## 2020-07-23 DIAGNOSIS — Z6825 Body mass index (BMI) 25.0-25.9, adult: Secondary | ICD-10-CM | POA: Insufficient documentation

## 2020-07-23 DIAGNOSIS — Z20822 Contact with and (suspected) exposure to covid-19: Secondary | ICD-10-CM

## 2020-07-23 DIAGNOSIS — R509 Fever, unspecified: Secondary | ICD-10-CM

## 2020-07-23 MED ORDER — ALBUTEROL SULFATE HFA 108 (90 BASE) MCG/ACT IN AERS: 2.0000 | INHALATION_SPRAY | Freq: Once | RESPIRATORY_TRACT | Status: DC | PRN

## 2020-07-23 MED ORDER — EPINEPHRINE 0.3 MG/0.3ML IJ SOAJ: 0.3000 mg | Freq: Once | INTRAMUSCULAR | Status: DC | PRN

## 2020-07-23 MED ORDER — SOTROVIMAB 500 MG/8ML IV SOLN
500.0000 mg | Freq: Once | INTRAVENOUS | Status: AC
  Administered 2020-07-23: 500 mg via INTRAVENOUS

## 2020-07-23 MED ORDER — SODIUM CHLORIDE 0.9 % IV SOLN: INTRAVENOUS | Status: DC | PRN

## 2020-07-23 MED ORDER — DIPHENHYDRAMINE HCL 50 MG/ML IJ SOLN: 50.0000 mg | Freq: Once | INTRAMUSCULAR | Status: DC | PRN

## 2020-07-23 MED ORDER — FAMOTIDINE IN NACL 20-0.9 MG/50ML-% IV SOLN: 20.0000 mg | Freq: Once | INTRAVENOUS | Status: DC | PRN

## 2020-07-23 MED ORDER — METHYLPREDNISOLONE SODIUM SUCC 125 MG IJ SOLR: 125.0000 mg | Freq: Once | INTRAMUSCULAR | Status: DC | PRN

## 2020-07-23 NOTE — Progress Notes (Signed)
  Diagnosis: COVID-19  Physician: Dr. Patrick Wright  Procedure:  Allergies reviewed.  Sotrovimab administered via IV infusion after med fact sheet provided to patient and all questions answered. Discharge instructions provided to patient; all questions answered.  Complications: No immediate complications noted.  Discharge: Discharged home   Danielle Sanford 07/23/2020   

## 2020-07-23 NOTE — Progress Notes (Signed)
  Diagnosis: COVID-19  Physician: Dr. Patrick Wright  Procedure:  Medication fact sheet provided to patient; all questions answered.  Allergies reviewed with patient.  IV placed.  Sotrovimab administered via IV infusion.   Complications: No immediate complications noted.  Discharge: Discharged home   Danielle Sanford 08/05/2020   

## 2020-07-23 NOTE — Discharge Instructions (Signed)
What types of side effects do monoclonal antibody drugs cause?  Common side effects  In general, the more common side effects caused by monoclonal antibody drugs include: . Allergic reactions, such as hives or itching . Flu-like signs and symptoms, including chills, fatigue, fever, and muscle aches and pains . Nausea, vomiting . Diarrhea . Skin rashes . Low blood pressure   The CDC is recommending patients who receive monoclonal antibody treatments wait at least 90 days before being vaccinated.  Currently, there are no data on the safety and efficacy of mRNA COVID-19 vaccines in persons who received monoclonal antibodies or convalescent plasma as part of COVID-19 treatment. Based on the estimated half-life of such therapies as well as evidence suggesting that reinfection is uncommon in the 90 days after initial infection, vaccination should be deferred for at least 90 days, as a precautionary measure until additional information becomes available, to avoid interference of the antibody treatment with vaccine-induced immune responses.   COVID-19 COVID-19 is a respiratory infection that is caused by a virus called severe acute respiratory syndrome coronavirus 2 (SARS-CoV-2). The disease is also known as coronavirus disease or novel coronavirus. In some people, the virus may not cause any symptoms. In others, it may cause a serious infection. The infection can get worse quickly and can lead to complications, such as: Pneumonia, or infection of the lungs. Acute respiratory distress syndrome or ARDS. This is a condition in which fluid build-up in the lungs prevents the lungs from filling with air and passing oxygen into the blood. Acute respiratory failure. This is a condition in which there is not enough oxygen passing from the lungs to the body or when carbon dioxide is not passing from the lungs out of the body. Sepsis or septic shock. This is a serious bodily reaction to an infection. Blood  clotting problems. Secondary infections due to bacteria or fungus. Organ failure. This is when your body's organs stop working. The virus that causes COVID-19 is contagious. This means that it can spread from person to person through droplets from coughs and sneezes (respiratory secretions). What are the causes? This illness is caused by a virus. You may catch the virus by: Breathing in droplets from an infected person. Droplets can be spread by a person breathing, speaking, singing, coughing, or sneezing. Touching something, like a table or a doorknob, that was exposed to the virus (contaminated) and then touching your mouth, nose, or eyes. What increases the risk? Risk for infection You are more likely to be infected with this virus if you: Are within 6 feet (2 meters) of a person with COVID-19. Provide care for or live with a person who is infected with COVID-19. Spend time in crowded indoor spaces or live in shared housing. Risk for serious illness You are more likely to become seriously ill from the virus if you: Are 76 years of age or older. The higher your age, the more you are at risk for serious illness. Live in a nursing home or long-term care facility. Have cancer. Have a long-term (chronic) disease such as: Chronic lung disease, including chronic obstructive pulmonary disease or asthma. A long-term disease that lowers your body's ability to fight infection (immunocompromised). Heart disease, including heart failure, a condition in which the arteries that lead to the heart become narrow or blocked (coronary artery disease), a disease which makes the heart muscle thick, weak, or stiff (cardiomyopathy). Diabetes. Chronic kidney disease. Sickle cell disease, a condition in which red blood cells have an  abnormal "sickle" shape. Liver disease. Are obese. What are the signs or symptoms? Symptoms of this condition can range from mild to severe. Symptoms may appear any time from 2 to  14 days after being exposed to the virus. They include: A fever or chills. A cough. Difficulty breathing. Headaches, body aches, or muscle aches. Runny or stuffy (congested) nose. A sore throat. New loss of taste or smell. Some people may also have stomach problems, such as nausea, vomiting, or diarrhea. Other people may not have any symptoms of COVID-19. How is this diagnosed? This condition may be diagnosed based on: Your signs and symptoms, especially if: You live in an area with a COVID-19 outbreak. You recently traveled to or from an area where the virus is common. You provide care for or live with a person who was diagnosed with COVID-19. You were exposed to a person who was diagnosed with COVID-19. A physical exam. Lab tests, which may include: Taking a sample of fluid from the back of your nose and throat (nasopharyngeal fluid), your nose, or your throat using a swab. A sample of mucus from your lungs (sputum). Blood tests. Imaging tests, which may include, X-rays, CT scan, or ultrasound. How is this treated? At present, there is no medicine to treat COVID-19. Medicines that treat other diseases are being used on a trial basis to see if they are effective against COVID-19. Your health care provider will talk with you about ways to treat your symptoms. For most people, the infection is mild and can be managed at home with rest, fluids, and over-the-counter medicines. Treatment for a serious infection usually takes places in a hospital intensive care unit (ICU). It may include one or more of the following treatments. These treatments are given until your symptoms improve. Receiving fluids and medicines through an IV. Supplemental oxygen. Extra oxygen is given through a tube in the nose, a face mask, or a hood. Positioning you to lie on your stomach (prone position). This makes it easier for oxygen to get into the lungs. Continuous positive airway pressure (CPAP) or bi-level  positive airway pressure (BPAP) machine. This treatment uses mild air pressure to keep the airways open. A tube that is connected to a motor delivers oxygen to the body. Ventilator. This treatment moves air into and out of the lungs by using a tube that is placed in your windpipe. Tracheostomy. This is a procedure to create a hole in the neck so that a breathing tube can be inserted. Extracorporeal membrane oxygenation (ECMO). This procedure gives the lungs a chance to recover by taking over the functions of the heart and lungs. It supplies oxygen to the body and removes carbon dioxide. Follow these instructions at home: Lifestyle If you are sick, stay home except to get medical care. Your health care provider will tell you how long to stay home. Call your health care provider before you go for medical care. Rest at home as told by your health care provider. Do not use any products that contain nicotine or tobacco, such as cigarettes, e-cigarettes, and chewing tobacco. If you need help quitting, ask your health care provider. Return to your normal activities as told by your health care provider. Ask your health care provider what activities are safe for you. General instructions Take over-the-counter and prescription medicines only as told by your health care provider. Drink enough fluid to keep your urine pale yellow. Keep all follow-up visits as told by your health care provider. This is important.  How is this prevented?  There is no vaccine to help prevent COVID-19 infection. However, there are steps you can take to protect yourself and others from this virus. To protect yourself:  Do not travel to areas where COVID-19 is a risk. The areas where COVID-19 is reported change often. To identify high-risk areas and travel restrictions, check the CDC travel website: StageSync.si If you live in, or must travel to, an area where COVID-19 is a risk, take precautions to avoid  infection. Stay away from people who are sick. Wash your hands often with soap and water for 20 seconds. If soap and water are not available, use an alcohol-based hand sanitizer. Avoid touching your mouth, face, eyes, or nose. Avoid going out in public, follow guidance from your state and local health authorities. If you must go out in public, wear a cloth face covering or face mask. Make sure your mask covers your nose and mouth. Avoid crowded indoor spaces. Stay at least 6 feet (2 meters) away from others. Disinfect objects and surfaces that are frequently touched every day. This may include: Counters and tables. Doorknobs and light switches. Sinks and faucets. Electronics, such as phones, remote controls, keyboards, computers, and tablets. To protect others: If you have symptoms of COVID-19, take steps to prevent the virus from spreading to others. If you think you have a COVID-19 infection, contact your health care provider right away. Tell your health care team that you think you may have a COVID-19 infection. Stay home. Leave your house only to seek medical care. Do not use public transport. Do not travel while you are sick. Wash your hands often with soap and water for 20 seconds. If soap and water are not available, use alcohol-based hand sanitizer. Stay away from other members of your household. Let healthy household members care for children and pets, if possible. If you have to care for children or pets, wash your hands often and wear a mask. If possible, stay in your own room, separate from others. Use a different bathroom. Make sure that all people in your household wash their hands well and often. Cough or sneeze into a tissue or your sleeve or elbow. Do not cough or sneeze into your hand or into the air. Wear a cloth face covering or face mask. Make sure your mask covers your nose and mouth. Where to find more information Centers for Disease Control and Prevention:  StickerEmporium.tn World Health Organization: https://thompson-craig.com/ Contact a health care provider if: You live in or have traveled to an area where COVID-19 is a risk and you have symptoms of the infection. You have had contact with someone who has COVID-19 and you have symptoms of the infection. Get help right away if: You have trouble breathing. You have pain or pressure in your chest. You have confusion. You have bluish lips and fingernails. You have difficulty waking from sleep. You have symptoms that get worse. These symptoms may represent a serious problem that is an emergency. Do not wait to see if the symptoms will go away. Get medical help right away. Call your local emergency services (911 in the [place] Do not drive yourself to the hospital. Let the emergency medical personnel know if you think you have COVID-19. Summary COVID-19 is a respiratory infection that is caused by a virus. It is also known as coronavirus disease or novel coronavirus. It can cause serious infections, such as pneumonia, acute respiratory distress syndrome, acute respiratory failure, or sepsis. The virus that causes  COVID-19 is contagious. This means that it can spread from person to person through droplets from breathing, speaking, singing, coughing, or sneezing. You are more likely to develop a serious illness if you are 60 years of age or older, have a weak immune system, live in a nursing home, or have chronic disease. There is no medicine to treat COVID-19. Your health care provider will talk with you about ways to treat your symptoms. Take steps to protect yourself and others from infection. Wash your hands often and disinfect objects and surfaces that are frequently touched every day. Stay away from people who are sick and wear a mask if you are sick. This information is not intended to replace advice given to you by your health care provider. Make sure you discuss  any questions you have with your health care provider. Document Revised: 07/03/2019 Document Reviewed: 10/09/2018 Elsevier Patient Education  2020 [employer] What types of side effects do monoclonal antibody drugs cause?  Common side effects  In general, the more common side effects caused by monoclonal antibody drugs include: . Allergic reactions, such as hives or itching . Flu-like signs and symptoms, including chills, fatigue, fever, and muscle aches and pains . Nausea, vomiting . Diarrhea . Skin rashes . Low blood pressure   The CDC is recommending patients who receive monoclonal antibody treatments wait at least 90 days before being vaccinated.  Currently, there are no data on the safety and efficacy of mRNA COVID-19 vaccines in persons who received monoclonal antibodies or convalescent plasma as part of COVID-19 treatment. Based on the estimated half-life of such therapies as well as evidence suggesting that reinfection is uncommon in the 90 days after initial infection, vaccination should be deferred for at least 90 days, as a precautionary measure until additional information becomes available, to avoid interference of the antibody treatment with vaccine-induced immune responses.

## 2020-07-25 ENCOUNTER — Telehealth: Payer: Self-pay

## 2020-07-25 ENCOUNTER — Other Ambulatory Visit: Payer: Self-pay | Admitting: Nurse Practitioner

## 2020-07-25 MED ORDER — HYDROCOD POLST-CPM POLST ER 10-8 MG/5ML PO SUER: 5.0000 mL | Freq: Two times a day (BID) | ORAL | 0 refills | Status: DC | PRN

## 2020-07-25 MED ORDER — BENZONATATE 100 MG PO CAPS: 100.0000 mg | ORAL_CAPSULE | Freq: Two times a day (BID) | ORAL | 0 refills | Status: DC | PRN

## 2020-07-25 MED ORDER — ALBUTEROL SULFATE HFA 108 (90 BASE) MCG/ACT IN AERS: 2.0000 | INHALATION_SPRAY | Freq: Four times a day (QID) | RESPIRATORY_TRACT | 0 refills | Status: DC | PRN

## 2020-07-25 NOTE — Progress Notes (Signed)
covid cough regimen

## 2020-07-25 NOTE — Telephone Encounter (Signed)
I recommend she obtain some Vagisil for vaginal dryness and pruritus, this is over the counter.  As for cough I have sent in an Albuterol inhaler she can use as needed + some Tessalon (cough medicine pills) and some cough syrup she can take at night as needed.  If any worsening symptoms, such as shortness of breath or chest pain or fever then recommend she head right to ER.

## 2020-07-25 NOTE — Telephone Encounter (Signed)
Called pt she states that is having some vaginal dryness and itching wanting to know if provider can prescribe a lubricant. Pt also wants to know if provider can prescribe something else that is better than mucinex. Please advise   Copied from CRM [id number]. Topic: General - Other >> Jul 25, 2020 10:46 AM Jaquita Rector A wrote: Reason for CRM: Patient called in asking to speak with the nurse say that she is having a female issue and, Also patient say that she have covid and asking if there is something that Aura Dials can prescribe for her because she sleep sitting up in a chair for breathing purposes, can be reached at Ph# [phone]

## 2020-07-25 NOTE — Telephone Encounter (Signed)
Called pt advised of Jolene's message. Pt verbalized understanding  

## 2020-07-27 ENCOUNTER — Ambulatory Visit: Payer: BC Managed Care – PPO

## 2020-07-28 NOTE — Telephone Encounter (Signed)
Routing to provider  

## 2020-07-28 NOTE — Telephone Encounter (Signed)
Pt called stating that the medication she was prescribed, Valacyclovir, has helped her some, but it has not gone away completely and is requesting to have something else sent in for her. Please advise.      [id number] - [place], [place] - [address]  [address] [place] [address]  Phone: [phone] Fax: [phone]  Hours: Not open 24 hours

## 2020-07-28 NOTE — Telephone Encounter (Signed)
I would recommend she reach out to her GYN and schedule the recommended follow-up with them ASAP for biopsy.  If medication not benefiting her she needs to be reassessed.

## 2020-07-29 NOTE — Telephone Encounter (Signed)
Patient notified

## 2020-08-01 ENCOUNTER — Ambulatory Visit: Payer: BC Managed Care – PPO | Admitting: Nurse Practitioner

## 2020-08-02 ENCOUNTER — Telehealth: Payer: Self-pay

## 2020-08-02 ENCOUNTER — Ambulatory Visit: Payer: BC Managed Care – PPO | Admitting: Nurse Practitioner

## 2020-08-02 MED ORDER — HYDROXYCHLOROQUINE SULFATE 200 MG PO TABS: 200.0000 mg | ORAL_TABLET | Freq: Two times a day (BID) | ORAL | 0 refills | Status: DC

## 2020-08-02 NOTE — Telephone Encounter (Signed)
RX sent in and patient advised. 

## 2020-08-02 NOTE — Telephone Encounter (Signed)
Patient called asking for RF of Plaquenil.   She was last seen in March but did not schedule 6 month follow up.

## 2020-08-02 NOTE — Telephone Encounter (Signed)
Send in one rf and schedule an appointment with me.  Thanks.

## 2020-08-17 ENCOUNTER — Ambulatory Visit (INDEPENDENT_AMBULATORY_CARE_PROVIDER_SITE_OTHER): Payer: BC Managed Care – PPO | Admitting: Dermatology

## 2020-08-17 ENCOUNTER — Encounter: Payer: Self-pay | Admitting: Dermatology

## 2020-08-17 ENCOUNTER — Other Ambulatory Visit: Payer: Self-pay

## 2020-08-17 DIAGNOSIS — D863 Sarcoidosis of skin: Secondary | ICD-10-CM

## 2020-08-17 MED ORDER — TACROLIMUS 0.03 % EX OINT: TOPICAL_OINTMENT | Freq: Two times a day (BID) | CUTANEOUS | 2 refills | Status: DC

## 2020-08-17 MED ORDER — CLOBETASOL PROP EMOLLIENT BASE 0.05 % EX CREA: 1.0000 "application " | TOPICAL_CREAM | Freq: Two times a day (BID) | CUTANEOUS | 2 refills | Status: DC | PRN

## 2020-08-17 NOTE — Progress Notes (Signed)
   Follow-Up Visit   Subjective  Danielle Sanford is a 59 y.o. female who presents for the following: Follow-up (Sarcoidosis of skin. Needs refill of Plaquenil. ).  No new lesions.  No breathing problems.  She had Covid, but has recovered completely.    The following portions of the chart were reviewed this encounter and updated as appropriate:     Review of Systems: No other skin or systemic complaints except as noted in HPI or Assessment and Plan.  Objective  Well appearing patient in no apparent distress; mood and affect are within normal limits.  A focused examination was performed including face, arms, legs. Relevant physical exam findings are noted in the Assessment and Plan.  Objective  Right Upper Cutaneous Lip, right cheek, nasal ala, arms, ankles: Violacous patches/plaques on arms and ankles.  Pink-Yellow/brown transluscent papules/plaques on cutaneous lip, nasal ala and right cheek with scarring.   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   Cont clobetasol cream to raised, active areas qd/bid prn. Avoid face, groin, axilla. Continue yearly eye exams (pt gets in summer) Cont Plaquenil 200mg  take 1 po BID, pending labs. Will send to [facility]-day supply with 1 Rf.   CBC w/diff ordered today.  Clobetasol Prop Emollient Base (CLOBETASOL PROPIONATE E) 0.05 % emollient cream - Right Upper Cutaneous Lip, right cheek, nasal ala, arms, ankles  Other Related Procedures CBC with Differential/Platelets  Reordered Medications tacrolimus (PROTOPIC) 0.03 % ointment  Return in about 6 months (around 02/15/2021) for sarcoidosis recheck.   I, 04/17/2021, CMA, am acting as scribe for Lawson Radar, MD.  Documentation: I have reviewed the above documentation for accuracy and completeness, and I agree with the above.  Willeen Niece MD

## 2020-08-17 NOTE — Patient Instructions (Addendum)
Use Tacrolimus ointment to areas on face. Does not cause skin thinning.   Hydroxychloroquine (plaquenil) can rarely cause irreversible damage to the retina of the eye if used for a long period of time. This damage can be identified and the medication stopped before vision changes are noticed by going for a yearly ophthalmology exam. Rarely, this medicine can cause low blood counts, liver inflammation, and/or a color change of the skin.  The use of Plaquenil requires long term medication management, including periodic office visits and monitoring of blood work.  Topical steroids (such as triamcinolone, fluocinolone, fluocinonide, mometasone, clobetasol, halobetasol, betamethasone, hydrocortisone) can cause thinning and lightening of the skin if they are used for too long in the same area. Your physician has selected the right strength medicine for your problem and area affected on the body. Please use your medication only as directed by your physician to prevent side effects.

## 2020-08-18 ENCOUNTER — Telehealth: Payer: Self-pay

## 2020-08-18 NOTE — Telephone Encounter (Signed)
Tacrolimus 0.03% was sent in from yesterday's office visit. In [facility] we have sent in Tacrolimus 0.1%. Which strength would you like for patient?

## 2020-08-21 ENCOUNTER — Encounter: Payer: Self-pay | Admitting: Nurse Practitioner

## 2020-08-21 NOTE — Telephone Encounter (Signed)
Tacrolimus 0.1% ointment qd/bid.  Thanks.

## 2020-08-23 ENCOUNTER — Ambulatory Visit (INDEPENDENT_AMBULATORY_CARE_PROVIDER_SITE_OTHER): Payer: BC Managed Care – PPO | Admitting: Nurse Practitioner

## 2020-08-23 ENCOUNTER — Encounter: Payer: Self-pay | Admitting: Nurse Practitioner

## 2020-08-23 ENCOUNTER — Other Ambulatory Visit: Payer: Self-pay

## 2020-08-23 VITALS — BP 110/67 | HR 92 | Temp 97.8°F | Ht 66.65 in | Wt 209.2 lb

## 2020-08-23 DIAGNOSIS — Z23 Encounter for immunization: Secondary | ICD-10-CM | POA: Diagnosis not present

## 2020-08-23 DIAGNOSIS — E782 Mixed hyperlipidemia: Secondary | ICD-10-CM | POA: Diagnosis not present

## 2020-08-23 DIAGNOSIS — Z6831 Body mass index (BMI) 31.0-31.9, adult: Secondary | ICD-10-CM

## 2020-08-23 DIAGNOSIS — I10 Essential (primary) hypertension: Secondary | ICD-10-CM

## 2020-08-23 DIAGNOSIS — Z8616 Personal history of COVID-19: Secondary | ICD-10-CM

## 2020-08-23 DIAGNOSIS — E039 Hypothyroidism, unspecified: Secondary | ICD-10-CM

## 2020-08-23 DIAGNOSIS — E6609 Other obesity due to excess calories: Secondary | ICD-10-CM

## 2020-08-23 DIAGNOSIS — D869 Sarcoidosis, unspecified: Secondary | ICD-10-CM | POA: Diagnosis not present

## 2020-08-23 MED ORDER — TACROLIMUS 0.1 % EX OINT: TOPICAL_OINTMENT | Freq: Every day | CUTANEOUS | 3 refills | Status: DC

## 2020-08-23 NOTE — Assessment & Plan Note (Signed)
Acute and improved symptoms at this time.  Continue to monitor.

## 2020-08-23 NOTE — Patient Instructions (Signed)
Healthy Eating Following a healthy eating pattern may help you to achieve and maintain a healthy body weight, reduce the risk of chronic disease, and live a long and productive life. It is important to follow a healthy eating pattern at an appropriate calorie level for your body. Your nutritional needs should be met primarily through food by choosing a variety of nutrient-rich foods. What are tips for following this plan? Reading food labels  Read labels and choose the following: ? Reduced or low sodium. ? Juices with 100% fruit juice. ? Foods with low saturated fats and high polyunsaturated and monounsaturated fats. ? Foods with whole grains, such as whole wheat, cracked wheat, brown rice, and wild rice. ? Whole grains that are fortified with folic acid. This is recommended for women who are pregnant or who want to become pregnant.  Read labels and avoid the following: ? Foods with a lot of added sugars. These include foods that contain brown sugar, corn sweetener, corn syrup, dextrose, fructose, glucose, high-fructose corn syrup, honey, invert sugar, lactose, malt syrup, maltose, molasses, raw sugar, sucrose, trehalose, or turbinado sugar.  Do not eat more than the following amounts of added sugar per day:  6 teaspoons (25 g) for women.  9 teaspoons (38 g) for men. ? Foods that contain processed or refined starches and grains. ? Refined grain products, such as white flour, degermed cornmeal, white bread, and white rice. Shopping  Choose nutrient-rich snacks, such as vegetables, whole fruits, and nuts. Avoid high-calorie and high-sugar snacks, such as potato chips, fruit snacks, and candy.  Use oil-based dressings and spreads on foods instead of solid fats such as butter, stick margarine, or cream cheese.  Limit pre-made sauces, mixes, and "instant" products such as flavored rice, instant noodles, and ready-made pasta.  Try more plant-protein sources, such as tofu, tempeh, black beans,  edamame, lentils, nuts, and seeds.  Explore eating plans such as the Mediterranean diet or vegetarian diet. Cooking  Use oil to saut or stir-fry foods instead of solid fats such as butter, stick margarine, or lard.  Try baking, boiling, grilling, or broiling instead of frying.  Remove the fatty part of meats before cooking.  Steam vegetables in water or broth. Meal planning   At meals, imagine dividing your plate into fourths: ? One-half of your plate is fruits and vegetables. ? One-fourth of your plate is whole grains. ? One-fourth of your plate is protein, especially lean meats, poultry, eggs, tofu, beans, or nuts.  Include low-fat dairy as part of your daily diet. Lifestyle  Choose healthy options in all settings, including home, work, school, restaurants, or stores.  Prepare your food safely: ? Wash your hands after handling raw meats. ? Keep food preparation surfaces clean by regularly washing with hot, soapy water. ? Keep raw meats separate from ready-to-eat foods, such as fruits and vegetables. ? Cook seafood, meat, poultry, and eggs to the recommended internal temperature. ? Store foods at safe temperatures. In general:  Keep cold foods at 59F (4.4C) or below.  Keep hot foods at 159F (60C) or above.  Keep your freezer at [facility] (-17.8C) or below.  Foods are no longer safe to eat when they have been between the temperatures of 40-159F (4.4-60C) for more than 2 hours. What foods should I eat? Fruits Aim to eat 2 cup-equivalents of fresh, canned (in natural juice), or frozen fruits each day. Examples of 1 cup-equivalent of fruit include 1 small apple, 8 large strawberries, 1 cup canned fruit,  cup  dried fruit, or 1 cup 100% juice. Vegetables Aim to eat 2-3 cup-equivalents of fresh and frozen vegetables each day, including different varieties and colors. Examples of 1 cup-equivalent of vegetables include 2 medium carrots, 2 cups raw, leafy greens, 1 cup chopped  vegetable (raw or cooked), or 1 medium baked potato. Grains Aim to eat 6 ounce-equivalents of whole grains each day. Examples of 1 ounce-equivalent of grains include 1 slice of bread, 1 cup ready-to-eat cereal, 3 cups popcorn, or  cup cooked rice, pasta, or cereal. Meats and other proteins Aim to eat 5-6 ounce-equivalents of protein each day. Examples of 1 ounce-equivalent of protein include 1 egg, 1/2 cup nuts or seeds, or 1 tablespoon (16 g) peanut butter. A cut of meat or fish that is the size of a deck of cards is about 3-4 ounce-equivalents.  Of the protein you eat each week, try to have at least 8 ounces come from seafood. This includes salmon, trout, herring, and anchovies. Dairy Aim to eat 3 cup-equivalents of fat-free or low-fat dairy each day. Examples of 1 cup-equivalent of dairy include 1 cup (240 mL) milk, 8 ounces (250 g) yogurt, 1 ounces (44 g) natural cheese, or 1 cup (240 mL) fortified soy milk. Fats and oils  Aim for about 5 teaspoons (21 g) per day. Choose monounsaturated fats, such as canola and olive oils, avocados, peanut butter, and most nuts, or polyunsaturated fats, such as sunflower, corn, and soybean oils, walnuts, pine nuts, sesame seeds, sunflower seeds, and flaxseed. Beverages  Aim for six 8-oz glasses of water per day. Limit coffee to three to five 8-oz cups per day.  Limit caffeinated beverages that have added calories, such as soda and energy drinks.  Limit alcohol intake to no more than 1 drink a day for nonpregnant women and 2 drinks a day for men. One drink equals 12 oz of beer (355 mL), 5 oz of wine (148 mL), or 1 oz of hard liquor (44 mL). Seasoning and other foods  Avoid adding excess amounts of salt to your foods. Try flavoring foods with herbs and spices instead of salt.  Avoid adding sugar to foods.  Try using oil-based dressings, sauces, and spreads instead of solid fats. This information is based on general [place] nutrition guidelines. For more  information, visit [employer]. Exact amounts may vary based on your nutrition needs. Summary  A healthy eating plan may help you to maintain a healthy weight, reduce the risk of chronic diseases, and stay active throughout your life.  Plan your meals. Make sure you eat the right portions of a variety of nutrient-rich foods.  Try baking, boiling, grilling, or broiling instead of frying.  Choose healthy options in all settings, including home, work, school, restaurants, or stores. This information is not intended to replace advice given to you by your health care provider. Make sure you discuss any questions you have with your health care provider. Document Revised: 12/16/2017 Document Reviewed: 12/16/2017 Elsevier Patient Education  [place].

## 2020-08-23 NOTE — Assessment & Plan Note (Signed)
Chronic, stable with BP below goal.  Continue current medication regimen and adjust as needed.  BMP up to date.  Recommend she monitor BP at few days a week at home and document for provider + focus on DASH diet.  Return in 6 months.

## 2020-08-23 NOTE — Assessment & Plan Note (Signed)
Chronic, stable.  Continue current medication regimen and adjust as needed.  TSH and Free T4 today, last levels on low side TSH and she missed repeat labs.  Will check labs in morning and if no changes needed send in refills.

## 2020-08-23 NOTE — Progress Notes (Signed)
BP 110/67   Pulse 92   Temp 97.8 F (36.6 C) (Oral)   Ht 5' 6.65" (1.693 m)   Wt 209 lb 3.2 oz (94.9 kg)   LMP  (LMP Unknown)   SpO2 92%   BMI 33.11 kg/m    Subjective:    Patient ID: Danielle Sanford, female    DOB: 24-Jan-1961, 59 y.o.   MRN: [id number]  HPI: Danielle Sanford is a 59 y.o. female  Chief Complaint  Patient presents with  . Hypertension  . Hyperlipidemia  . Covid Follow up  . Sarcoidosis of skin   HYPERTENSION / HYPERLIPIDEMIA Continues on Lipitor for HLD and Propranolol for HTN. Satisfied with current treatment?yes Duration of hypertension:chronic BP monitoring frequency:not checking BP range:none BP medication side effects:no Duration of hyperlipidemia:chronic Cholesterol medication side effects:no Cholesterol supplements: none Medication compliance:good compliance Aspirin:no Recent stressors:no Recurrent headaches:no Visual changes:no Palpitations:no Dyspnea:no Chest pain:no Lower extremity edema:no Dizzy/lightheaded:no The 10-year ASCVD risk score Denman George DC Jr., et al., 2013) is: 3.9%   Values used to calculate the score:     Age: 28 years     Sex: Female     Is Non-Hispanic African American: No     Diabetic: No     Tobacco smoker: No     Systolic Blood Pressure: 110 mmHg     Is BP treated: Yes     HDL Cholesterol: 33 mg/dL     Total Cholesterol: 161 mg/dL  HYPOTHYROIDISM Continues on Levothyroxine 175 MCG and last TSH June was 0.2690 -- was to recheck, but missed this.   Thyroid control status:stable Satisfied with current treatment?yes Medication side effects:no Medication compliance:good compliance Etiology of hypothyroidism:  Recent dose adjustment:no Fatigue:since Covid Cold intolerance:no Heat intolerance:no Weight gain:no Weight loss:no Constipation:no Diarrhea/loose stools:no Palpitations:no Lower extremity edema:no Anxiety/depressed mood:no  SARCOIDOSIS OF SKIN: On Plaquenil  for sarcoidosis. Was being followed by [facility] rheumatology, but has not been seen since 05/17/2016. Is now being followed by dermatology, last saw 08/18/20 and no changes made. She reports the main issue is on her skin.  Last platelet count 167 in August 2021. Denies easy bruising, blood in stool, nose bleeds, or gum bleeding.   COVID INFECTION Was diagnosed 07/21/2020 -- had MAB infusion on 07/23/20.  She is vaccinated -- x 2 vaccines.  Overall reports feeling better with some remaining fatigue only. Fever: no Cough: no Shortness of breath: no Wheezing: no Chest pain: no Chest tightness: no Chest congestion: no Nasal congestion: no Runny nose: no Post nasal drip: no Sneezing: no Sore throat: no Swollen glands: no Sinus pressure: no Headache: no Face pain: no Toothache: no Ear pain: none Ear pressure: none Eyes red/itching:no Eye drainage/crusting: no  Vomiting: no Rash: no Fatigue: yes  Relevant past medical, surgical, family and social history reviewed and updated as indicated. Interim medical history since our last visit reviewed. Allergies and medications reviewed and updated.  Review of Systems  Constitutional: Negative for activity change, appetite change, diaphoresis, fatigue and fever.  Respiratory: Negative for cough, chest tightness and shortness of breath.   Cardiovascular: Negative for chest pain, palpitations and leg swelling.  Gastrointestinal: Negative.   Neurological: Negative.   Psychiatric/Behavioral: Negative.     Per HPI unless specifically indicated above     Objective:    BP 110/67   Pulse 92   Temp 97.8 F (36.6 C) (Oral)   Ht 5' 6.65" (1.693 m)   Wt 209 lb 3.2 oz (94.9 kg)   LMP  (  LMP Unknown)   SpO2 92%   BMI 33.11 kg/m   Wt Readings from Last 3 Encounters:  08/23/20 209 lb 3.2 oz (94.9 kg)  07/08/20 210 lb (95.3 kg)  01/27/20 209 lb (94.8 kg)    Physical Exam Vitals and nursing note reviewed.  Constitutional:      General: She is  awake. She is not in acute distress.    Appearance: She is well-developed and well-groomed. She is obese. She is not ill-appearing.  HENT:     Head: Normocephalic.     Right Ear: Hearing normal.     Left Ear: Hearing normal.  Eyes:     General: Lids are normal.        Right eye: No discharge.        Left eye: No discharge.     Conjunctiva/sclera: Conjunctivae normal.     Pupils: Pupils are equal, round, and reactive to light.  Cardiovascular:     Rate and Rhythm: Normal rate and regular rhythm.     Heart sounds: Normal heart sounds. No murmur heard.  No gallop.   Pulmonary:     Effort: Pulmonary effort is normal. No accessory muscle usage or respiratory distress.     Breath sounds: Normal breath sounds.  Abdominal:     General: Bowel sounds are normal.     Palpations: Abdomen is soft.  Musculoskeletal:     Cervical back: Normal range of motion and neck supple.     Right lower leg: No edema.     Left lower leg: No edema.  Skin:    General: Skin is warm and dry.  Neurological:     Mental Status: She is alert and oriented to person, place, and time.  Psychiatric:        Attention and Perception: Attention normal.        Mood and Affect: Mood normal.        Speech: Speech normal.        Behavior: Behavior normal. Behavior is cooperative.        Thought Content: Thought content normal.    Results for orders placed or performed in visit on 07/21/20  Novel Coronavirus, NAA ([facility])   Specimen: Nasopharyngeal(NP) swabs in vial transport medium  Result Value Ref Range   SARS-CoV-2, NAA Detected (A) Not Detected  SARS-COV-2, NAA 2 DAY TAT  Result Value Ref Range   SARS-CoV-2, NAA 2 DAY TAT Performed       Assessment & Plan:   Problem List Items Addressed This Visit      Cardiovascular and Mediastinum   Hypertension    Chronic, stable with BP below goal.  Continue current medication regimen and adjust as needed.  BMP up to date.  Recommend she monitor BP at few days a week  at home and document for provider + focus on DASH diet.  Return in 6 months.        Endocrine   Hypothyroidism    Chronic, stable.  Continue current medication regimen and adjust as needed.  TSH and Free T4 today, last levels on low side TSH and she missed repeat labs.  Will check labs in morning and if no changes needed send in refills.       Relevant Orders   T4, free   TSH     Other   Sarcoid - Primary    Followed by dermatology.  Continue regimen as prescribed by them, Plaquenil, she reports labs with them upcoming.  Reviewed recent note.  Hyperlipidemia    Chronic, stable.  Continue current medication regimen and adjust as needed.  Lipid panel next visit.      Obesity    Recommended eating smaller high protein, low fat meals more frequently and exercising 30 mins a day 5 times a week with a goal of 10-15lb weight loss in the next 3 months. Patient voiced their understanding and motivation to adhere to these recommendations.       History of 2019 novel coronavirus disease (COVID-19)    Acute and improved symptoms at this time.  Continue to monitor.          Follow up plan: Return in about 6 months (around 02/21/2021) for Hypothyroid, HTN/HLD, Sarcoidosis.

## 2020-08-23 NOTE — Assessment & Plan Note (Signed)
Followed by dermatology.  Continue regimen as prescribed by them, Plaquenil, she reports labs with them upcoming.  Reviewed recent note.

## 2020-08-23 NOTE — Assessment & Plan Note (Signed)
Recommended eating smaller high protein, low fat meals more frequently and exercising 30 mins a day 5 times a week with a goal of 10-15lb weight loss in the next 3 months. Patient voiced their understanding and motivation to adhere to these recommendations.  

## 2020-08-23 NOTE — Telephone Encounter (Signed)
Medication correction 

## 2020-08-23 NOTE — Assessment & Plan Note (Signed)
Chronic, stable.  Continue current medication regimen and adjust as needed.  Lipid panel next visit. 

## 2020-08-24 LAB — TSH: TSH: 0.594 u[IU]/mL (ref 0.450–4.500)

## 2020-08-24 LAB — T4, FREE: Free T4: 1.52 ng/dL (ref 0.82–1.77)

## 2020-08-24 NOTE — Progress Notes (Signed)
Please let Amere know her thyroid labs are within normal range and she can continue current Levothyroxine dosing.  No changes needed.  Will send in refills for her.  Have a great day!! Keep being awesome!!  Thank you for allowing me to participate in your care. Kindest regards, Broadus Costilla

## 2020-08-31 DIAGNOSIS — H02055 Trichiasis without entropian left lower eyelid: Secondary | ICD-10-CM | POA: Diagnosis not present

## 2020-09-05 ENCOUNTER — Other Ambulatory Visit: Payer: Self-pay

## 2020-09-05 MED ORDER — TACROLIMUS 0.1 % EX OINT
TOPICAL_OINTMENT | Freq: Every day | CUTANEOUS | 3 refills | Status: DC
Start: 2020-09-05 — End: 2020-11-14

## 2020-09-05 NOTE — Progress Notes (Signed)
Pt [facility]

## 2020-09-19 ENCOUNTER — Telehealth: Payer: Self-pay

## 2020-09-19 NOTE — Telephone Encounter (Signed)
Per fax from insurance Tacrolimus 0.03% ointment not covered by patient's insurance since patient doesn't have a diagnosis of atopic dermatitis. Protopic 0.1% sent on 09/05/20.

## 2020-09-26 ENCOUNTER — Other Ambulatory Visit: Payer: Self-pay

## 2020-09-26 DIAGNOSIS — D863 Sarcoidosis of skin: Secondary | ICD-10-CM

## 2020-09-26 MED ORDER — HYDROXYCHLOROQUINE SULFATE 200 MG PO TABS: 200.0000 mg | ORAL_TABLET | Freq: Two times a day (BID) | ORAL | 0 refills | Status: DC

## 2020-10-11 ENCOUNTER — Other Ambulatory Visit: Payer: Self-pay | Admitting: Nurse Practitioner

## 2020-10-11 ENCOUNTER — Telehealth: Payer: Self-pay

## 2020-10-11 MED ORDER — VALACYCLOVIR HCL 1 G PO TABS: 1000.0000 mg | ORAL_TABLET | Freq: Two times a day (BID) | ORAL | 0 refills | Status: AC

## 2020-10-11 NOTE — Telephone Encounter (Signed)
Sent in refill, but if ongoing would like to see in office.

## 2020-10-11 NOTE — Telephone Encounter (Signed)
Routing to provider to advise.  

## 2020-10-11 NOTE — Telephone Encounter (Signed)
Copied from CRM [id number]. Topic: General - Other >> Oct 11, 2020 10:44 AM Gaetana Michaelis A wrote: Reason for CRM: Patient called in to inquire if PCP would re-prescribe them valACYclovir (VALTREX) 1000 MG  Patient was last prescribed this medication on 07/18/20. Patient would like to be contacted if being prescribed this medication is possible.  Do we  Need to schedule for apt for this or can medication be sent.

## 2020-10-11 NOTE — Telephone Encounter (Signed)
Patient notified and verbalized understanding. 

## 2020-10-24 ENCOUNTER — Encounter: Payer: Self-pay | Admitting: Nurse Practitioner

## 2020-10-24 ENCOUNTER — Other Ambulatory Visit: Payer: Self-pay

## 2020-10-24 ENCOUNTER — Ambulatory Visit (INDEPENDENT_AMBULATORY_CARE_PROVIDER_SITE_OTHER): Payer: BC Managed Care – PPO | Admitting: Nurse Practitioner

## 2020-10-24 DIAGNOSIS — R87612 Low grade squamous intraepithelial lesion on cytologic smear of cervix (LGSIL): Secondary | ICD-10-CM

## 2020-10-24 DIAGNOSIS — N898 Other specified noninflammatory disorders of vagina: Secondary | ICD-10-CM

## 2020-10-24 MED ORDER — VALACYCLOVIR HCL 500 MG PO TABS: 500.0000 mg | ORAL_TABLET | Freq: Two times a day (BID) | ORAL | 0 refills | Status: DC

## 2020-10-24 NOTE — Patient Instructions (Signed)
[email or web address]">  Genital Herpes Genital herpes is a common sexually transmitted infection (STI) that is caused by a virus. The virus spreads from person to person through sexual contact. The infection can cause itching, blisters, and sores around the genitals or rectum. Symptoms may last for several days and then go away. This is called an outbreak. The virus remains in the body, however, so more outbreaks may happen in the future. The time between outbreaks varies and can be from months to years. Genital herpes can affect anyone. It is particularly concerning for pregnant women because the virus can be passed to the baby during delivery and can cause serious problems. Genital herpes is also a concern for people who have a weak disease-fighting system (immune system). What are the causes? This condition is caused by the human herpesvirus, also called herpes simplex virus, type 1 or type 2 (HSV-1 or HSV-2). The virus may spread through:  Sexual contact with an infected person, including vaginal, anal, and oral sex.  Contact with fluid from a herpes sore.  The skin. This means that you can get herpes from an infected partner even if there are no blisters or sores present. Your partner may not know that he or she is infected. What increases the risk? You are more likely to develop this condition if:  You have sex with many partners.  You do not use latex condoms during sex. What are the signs or symptoms? Most people do not have symptoms (are asymptomatic), or they have mild symptoms that may be mistaken for other skin problems. Symptoms may include:  Small, red bumps near the genitals, rectum, or mouth. These bumps turn into blisters and then sores.  Flu-like symptoms, including: ? Fever. ? Body aches. ? Swollen lymph nodes. ? Headache.  Painful urination.  Pain and itching in the genital area or rectal  area.  Vaginal discharge.  Tingling or shooting pain in the legs and buttocks. Generally, symptoms are more severe and last longer during the first (primary) outbreak. Flu-like symptoms are also more common during the primary outbreak.   How is this diagnosed? This condition may be diagnosed based on:  A physical exam.  Your medical history.  Blood tests.  A test of a fluid sample (culture) from an open sore. How is this treated? There is no cure for this condition, but treatment with antiviral medicines that are taken by mouth (orally) can do the following:  Speed up healing and relieve symptoms.  Help to reduce the spread of the virus to sexual partners.  Limit the chance of future outbreaks, or make future outbreaks shorter.  Lessen symptoms of future outbreaks. Your health care provider may also recommend pain relief medicines, such as aspirin or ibuprofen. Follow these instructions at home: If you have an outbreak:  Keep the affected areas dry and clean.  Avoid rubbing or touching blisters and sores. If you do touch blisters or sores: ? Wash your hands thoroughly with soap and water. ? Do not touch your eyes afterward.  To help relieve pain or itching, you may take the following actions as told by your health care provider: ? Apply a cold, wet cloth (cold compress) to affected areas 4-6 times a day. ? Apply a substance that protects your skin and reduces bleeding (astringent). ? Apply a gel that helps relieve pain around sores (lidocaine gel). ? Take a warm, shallow bath that cleans the genital area (sitz bath). Sexual activity  Do not have sexual contact during   active outbreaks.  Practice safe sex. Herpes can spread even if your partner does not have blisters or sores. Latex condoms and female condoms may help prevent the spread of the herpes virus. General instructions  Take over-the-counter and prescription medicines only as told by your health care  provider.  Keep all follow-up visits as told by your health care provider. This is important. How is this prevented?  Use condoms. Although you can get genital herpes during sexual contact even with the use of a condom, a condom can provide some protection.  Avoid having multiple sexual partners.  Talk with your sexual partner about any symptoms either of you may have. Also, talk with your partner about any history of STIs.  Get tested for STIs before you have sex. Ask your partner to do the same.  Do not have sexual contact if you have active symptoms of genital herpes. Contact a health care provider if:  Your symptoms are not improving with medicine.  Your symptoms return, or you have new symptoms.  You have a fever.  You have abdominal pain.  You have redness, swelling, or pain in your eye.  You notice new sores on other parts of your body.  You are a woman and you experience bleeding between menstrual periods.  You have had herpes and you become pregnant or plan to become pregnant. Summary  Genital herpes is a common sexually transmitted infection (STI) that is caused by the herpes simplex virus, type 1 or type 2 (HSV-1 or HSV-2).  These viruses are most often spread through sexual contact with an infected person.  You are more likely to develop this condition if you have sex with many partners or you do not use condoms during sex.  Most people do not have symptoms (are asymptomatic) or have mild symptoms that may be mistaken for other skin problems. Symptoms occur as outbreaks that may happen months or years apart.  There is no cure for this condition, but treatment with oral antiviral medicines can reduce symptoms, reduce the chance of spreading the virus to a partner, prevent future outbreaks, or shorten future outbreaks. This information is not intended to replace advice given to you by your health care provider. Make sure you discuss any questions you have with your  health care provider. Document Revised: 05/14/2019 Document Reviewed: 05/14/2019 Elsevier Patient Education  2021 Elsevier Inc.  

## 2020-10-24 NOTE — Assessment & Plan Note (Signed)
Recently obtained by GYN, have HIGHLY recommended she scheduled biopsy and follow-up with GYN as soon as possible for further recommendations and guidance.  Discussed at length with her.

## 2020-10-24 NOTE — Assessment & Plan Note (Signed)
Ongoing issues with herpes outbreak.  No maintenance medications for this. At this time will place on maintenance daily medication, due to ongoing issues.  Valtrex 500 MG BID sent in.  Highly recommended she follow-up with GYN ASAP.  To return to office if ongoing or worsening symptoms.

## 2020-10-24 NOTE — Progress Notes (Signed)
BP 120/79   Pulse 66   Temp 98.1 F (36.7 C) (Oral)   Ht 5' 6.61" (1.692 m)   Wt 208 lb 12.8 oz (94.7 kg)   LMP  (LMP Unknown)   SpO2 100%   BMI 33.08 kg/m    Subjective:    Patient ID: Danielle Sanford, female    DOB: 1960/11/06, 60 y.o.   MRN: [id number]  HPI: KIMBERLYE DILGER is a 60 y.o. female  Chief Complaint  Patient presents with  . Herpes Zoster    Has been treated for in Oct, Nov, and Dec and is still not clear up   VAGINAL IRRITATION Has history of herpes -- reports current "bumps" with burning and itching. Has been present for two months.  Initially diagnosed with herpes in 2000. Had no outbreaks for long while -- outbreaks October, November, December.  Was treated for these.  She reports a couple areas have healed, but some ongoing blisters.  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  Last saw Dr. Dalbert Garnet in September.   Duration: months Pruritus: yes Dysuria: at times Malodorous: no Urinary frequency: no Fevers: no Abdominal pain: no  Sexual activity: monogamous History of sexually transmitted diseases: yes Recent antibiotic use: no Context: has had before  Treatments attempted: none  Relevant past medical, surgical, family and social history reviewed and updated as indicated. Interim medical history since our last visit reviewed. Allergies and medications reviewed and updated.  Review of Systems  Constitutional: Negative for activity change, appetite change, diaphoresis, fatigue and fever.  Respiratory: Negative for cough, chest tightness and shortness of breath.   Cardiovascular: Negative for chest pain, palpitations and leg swelling.  Gastrointestinal: Negative.   Genitourinary: Negative for vaginal bleeding, vaginal discharge and vaginal pain.  Neurological: Negative.   Psychiatric/Behavioral: Negative.     Per HPI unless  specifically indicated above     Objective:    BP 120/79   Pulse 66   Temp 98.1 F (36.7 C) (Oral)   Ht 5' 6.61" (1.692 m)   Wt 208 lb 12.8 oz (94.7 kg)   LMP  (LMP Unknown)   SpO2 100%   BMI 33.08 kg/m   Wt Readings from Last 3 Encounters:  10/24/20 208 lb 12.8 oz (94.7 kg)  08/23/20 209 lb 3.2 oz (94.9 kg)  07/08/20 210 lb (95.3 kg)    Physical Exam Vitals and nursing note reviewed.  Constitutional:      General: She is awake. She is not in acute distress.    Appearance: She is well-developed and well-groomed. She is obese. She is not ill-appearing.  HENT:     Head: Normocephalic.     Right Ear: Hearing normal.     Left Ear: Hearing normal.  Eyes:     General: Lids are normal.        Right eye: No discharge.        Left eye: No discharge.     Conjunctiva/sclera: Conjunctivae normal.     Pupils: Pupils are equal, round, and reactive to light.  Cardiovascular:     Rate and Rhythm: Normal rate and regular rhythm.     Heart sounds: Normal heart sounds. No murmur heard. No gallop.   Pulmonary:     Effort: Pulmonary effort is normal. No accessory muscle usage or respiratory distress.     Breath sounds: Normal breath  sounds.  Abdominal:     General: Bowel sounds are normal.     Palpations: Abdomen is soft.  Genitourinary:    Pubic Area: No rash.      Labia:        Right: Lesion present.        Left: Lesion present.      Comments: Chaperone declined -- bilateral inner labia with small, raised vesicles with no drainage, mild erythema around vesicles.  Excoriation noted to lower aspect labia bilaterally.  Some vesicles intact and others open with white, erythema base. Musculoskeletal:     Cervical back: Normal range of motion and neck supple.     Right lower leg: No edema.     Left lower leg: No edema.  Skin:    General: Skin is warm and dry.  Neurological:     Mental Status: She is alert and oriented to person, place, and time.  Psychiatric:        Attention and  Perception: Attention normal.        Mood and Affect: Mood normal.        Speech: Speech normal.        Behavior: Behavior normal. Behavior is cooperative.        Thought Content: Thought content normal.    Results for orders placed or performed in visit on 08/23/20  T4, free  Result Value Ref Range   Free T4 1.52 0.82 - 1.77 ng/dL  TSH  Result Value Ref Range   TSH 0.594 0.450 - 4.500 uIU/mL      Assessment & Plan:   Problem List Items Addressed This Visit      Other   Vaginal lesion    Ongoing issues with herpes outbreak.  No maintenance medications for this. At this time will place on maintenance daily medication, due to ongoing issues.  Valtrex 500 MG BID sent in.  Highly recommended she follow-up with GYN ASAP.  To return to office if ongoing or worsening symptoms.      Abnormal Pap smear of cervix    Recently obtained by GYN, have HIGHLY recommended she scheduled biopsy and follow-up with GYN as soon as possible for further recommendations and guidance.  Discussed at length with her.          Follow up plan: Return if symptoms worsen or fail to improve.

## 2020-11-10 ENCOUNTER — Other Ambulatory Visit: Payer: Self-pay

## 2020-11-10 ENCOUNTER — Encounter: Payer: BC Managed Care – PPO | Admitting: Obstetrics and Gynecology

## 2020-11-14 ENCOUNTER — Ambulatory Visit (INDEPENDENT_AMBULATORY_CARE_PROVIDER_SITE_OTHER): Payer: BC Managed Care – PPO | Admitting: Nurse Practitioner

## 2020-11-14 ENCOUNTER — Encounter: Payer: Self-pay | Admitting: Nurse Practitioner

## 2020-11-14 ENCOUNTER — Other Ambulatory Visit: Payer: Self-pay

## 2020-11-14 ENCOUNTER — Other Ambulatory Visit (HOSPITAL_COMMUNITY)
Admission: RE | Admit: 2020-11-14 | Discharge: 2020-11-14 | Disposition: A | Discharge status: Home / Self Care | Payer: BC Managed Care – PPO | Source: Ambulatory Visit | Attending: Obstetrics and Gynecology | Admitting: Obstetrics and Gynecology

## 2020-11-14 VITALS — BP 122/78 | HR 74 | Resp 16 | Ht 66.5 in | Wt 208.0 lb

## 2020-11-14 DIAGNOSIS — N9089 Other specified noninflammatory disorders of vulva and perineum: Secondary | ICD-10-CM | POA: Diagnosis not present

## 2020-11-14 DIAGNOSIS — D071 Carcinoma in situ of vulva: Secondary | ICD-10-CM | POA: Diagnosis not present

## 2020-11-14 MED ORDER — LIDOCAINE 5 % EX OINT: 1.0000 "application " | TOPICAL_OINTMENT | Freq: Three times a day (TID) | CUTANEOUS | 1 refills | Status: DC

## 2020-11-14 NOTE — Patient Instructions (Signed)
Vulva Biopsy, Care After This sheet gives you information about how to care for yourself after your procedure. Your doctor may also give you more specific instructions. If you have problems or questions, contact your doctor. What can I expect after the procedure? After the procedure, it is common to have:  Slight bleeding from the biopsy site.  Soreness at the biopsy site. Follow these instructions at home: Biopsy site care  Follow instructions from your doctor about how to take care of your biopsy site. Make sure you: ? Clean the area using water and mild soap two times a day or as told by your doctor. Gently pat the area dry. ? If you were prescribed an antibiotic ointment, apply it as told by your doctor. Do not stop using the antibiotic even if your condition gets better. ? Take a warm water bath (sitz bath) as needed to help with pain. A sitz bath is taken while you are sitting down. The water should only come up to your hips and cover your butt. ? Leave stitches (sutures), skin glue, or skin tape (adhesive) strips in place. They may need to stay in place for 2 weeks or longer. If tape strips get loose and curl up, you may trim the loose edges. Do not remove tape strips completely unless your doctor says it is okay. ? Check your biopsy area every day for signs of infection. Check for:  More redness, swelling, or pain.  More fluid or blood.  Warmth.  Pus or a bad smell. ? Do not rub the biopsy area after peeing (urinating).  Gently pat the area dry, or use a bottle filled with warm water (peri-bottle) to clean the area.  Gently wipe from front to back.   Lifestyle  Wear loose, cotton underwear.  Do not wear tight pants.  Do not use a tampon, douche, or put anything in your vagina for at least 1 week or until your doctor says it is okay.  Do not have sex for at least 1 week or until your doctor says it is okay.  Do not exercise until your doctor says it is okay.  Do not  swim or use a hot tub until your doctor says it is okay. You may shower or take a sitz bath. General instructions  Take over-the-counter and prescription medicines only as told by your doctor.  Use a sanitary pad until the bleeding stops.  Keep all follow-up visits as told by your doctor. This is important. Contact a doctor if:  You have more redness, swelling, or pain around your biopsy site.  You have more fluid or blood coming from your biopsy site.  Your biopsy site feels warm when you touch it.  Medicines do not help with your pain. Get help right away if:  You have a lot of bleeding from the vulva.  You have pus or a bad smell coming from your biopsy site.  You have a fever.  You have pain in the lower belly (abdomen). Summary  After the procedure, it is common to have slight bleeding and soreness at the biopsy site.  Follow all instructions as told by your doctor. Clean the area with water and mild soap. Do not rub. Pat the area dry.  Take sitz baths as needed. Leave any stitches in place.  Check your biopsy site for infection. Signs include more redness, swelling, pain, fluid, or blood, or feeling warm when you touch it.  Get help right away if you have   a lot of bleeding, a fever, pus or a bad smell, or pain in your lower belly. This information is not intended to replace advice given to you by your health care provider. Make sure you discuss any questions you have with your health care provider. Document Revised: 03/06/2018 Document Reviewed: 03/06/2018 Elsevier Patient Education  2021 Elsevier Inc.  

## 2020-11-14 NOTE — Progress Notes (Signed)
60 y.o. G1P0 Married White or Caucasian female here for vaginal lesion & follow up of abnormal pap smear.      Here because has a infection in vagina. Experiencing blisters in vagina. Also having itching. It has been going on since October. She "pinched the blisters and got some of it out, it hurt very bad".  Currently taking Valtrex for herpes but blister is still present.  Has LSIL pap of vagina, has not had follow up for that yet  Has not had sex in the last several weeks, sex hurts in the area.  No LMP recorded (lmp unknown). Patient has had a hysterectomy.          Sexually active: Yes.    The current method of family planning is status post hysterectomy.    Exercising: Yes.    walking Smoker:  no  Health Maintenance: Pap:  05-24-2020 neg HPV HR neg History of abnormal Pap:  yes MMG:  06-13-17 category b density birads 1:neg Colonoscopy:  2014 BMD:   none TDaP:  2018 Gardasil:   n/a Covid-19: pfizer Hep C testing: unsure    reports that she has never smoked. She has never used smokeless tobacco. She reports that she does not drink alcohol and does not use drugs.  Past Medical History:  Diagnosis Date  . Abnormal Pap smear of vagina   . Allergy   . COVID-19   . Hyperlipidemia   . Hypertension   . Sarcoid   . STD (sexually transmitted disease)    HSV, HPV  . Tachycardia   . Thyroid disease     Past Surgical History:  Procedure Laterality Date  . ABDOMINAL HYSTERECTOMY    . BREAST EXCISIONAL BIOPSY Left 1999  . BREAST SURGERY    . TEAR DUCT PROBING      Current Outpatient Medications  Medication Sig Dispense Refill  . atorvastatin (LIPITOR) 20 MG tablet TAKE 1 TABLET DAILY 90 tablet 3  . Clobetasol Prop Emollient Base (CLOBETASOL PROPIONATE E) 0.05 % emollient cream Apply 1 application topically 2 (two) times daily as needed. 30 g 2  . hydroxychloroquine (PLAQUENIL) 200 MG tablet Take 200 mg by mouth 2 (two) times daily.    Marland Kitchen levothyroxine (SYNTHROID) 150 MCG  tablet Take 1 tablet (150 mcg total) by mouth daily. 90 tablet 4  . mometasone (ELOCON) 0.1 % cream as needed.    . mupirocin ointment (BACTROBAN) 2 % Apply topically as needed. 22 g 1  . propranolol (INDERAL) 10 MG tablet Take 1 tablet (10 mg total) by mouth 3 (three) times daily. 270 tablet 4  . triamcinolone (KENALOG) 0.025 % cream Apply 1 application topically 2 (two) times daily as needed. Apply to areas of dry, cracked skin on hands 30 g 0  . valACYclovir (VALTREX) 500 MG tablet Take 1 tablet (500 mg total) by mouth 2 (two) times daily. 180 tablet 0   No current facility-administered medications for this visit.    Family History  Problem Relation Age of Onset  . Cancer Mother        pancreatic  . Diabetes Father   . Thyroid disease Father   . Colon cancer Maternal Grandmother   . Cancer Maternal Grandfather        prostate  . Stroke Paternal Grandfather   . Breast cancer Maternal Aunt 80    Review of Systems  Constitutional: Negative.   HENT: Negative.   Eyes: Negative.   Respiratory: Negative.   Cardiovascular: Negative.  Gastrointestinal: Negative.   Endocrine: Negative.   Genitourinary: Negative.   Musculoskeletal: Negative.   Skin: Negative.   Allergic/Immunologic: Negative.   Neurological: Negative.   Hematological: Negative.   Psychiatric/Behavioral: Negative.     Exam:   BP 122/78   Pulse 74   Resp 16   Ht 5' 6.5" (1.689 m)   Wt 208 lb (94.3 kg)   LMP  (LMP Unknown)   BMI 33.07 kg/m   Height: 5' 6.5" (168.9 cm)  General appearance: alert, cooperative and appears stated age  Pelvic: External genitalia: Multiple condyloma, Multiple flat warts, white, encompassing external genitalia right and left inner labia, no visible blisters, some of the areas are oozing              Urethra:  normal appearing urethra with no masses, tenderness or lesions              Bartholins and Skenes: normal     Advised that it does not appear that she has any HSV lesions.  She has multiple genital warts and that some times they can progress to much more serious lesions and biopsy recommended. Reviewed risks/benefits Consent signed.  Betadine applied x 2, lidocaine 1% injected to the sites 107mm punch biopsy taken on the left labia minora 3 mm punch biopsy taken on right labia majora Silver nitrate applied to area.  Left labia with continued bleeding. One stitch placed. Gauze applied. Post care instruction printed and given to patient.  Assessment/Plan: Vulvar lesion - Plan: lidocaine (XYLOCAINE) 5 % ointment, Surgical pathology( [place]/ [facility]), Surgical pathology( [place]/ [facility])   F/U pending results

## 2020-11-17 ENCOUNTER — Telehealth: Payer: Self-pay | Admitting: *Deleted

## 2020-11-17 DIAGNOSIS — N9089 Other specified noninflammatory disorders of vulva and perineum: Secondary | ICD-10-CM

## 2020-11-17 LAB — SURGICAL PATHOLOGY

## 2020-11-17 NOTE — Telephone Encounter (Signed)
Referral message sent to [facility] at [facility], she will call to scheduled.

## 2020-11-17 NOTE — Telephone Encounter (Signed)
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 Informed that she will be called with details about her upcoming appointment to GYN ONC "

## 2020-11-21 ENCOUNTER — Other Ambulatory Visit: Payer: Self-pay | Admitting: Nurse Practitioner

## 2020-11-21 ENCOUNTER — Encounter: Payer: Self-pay | Admitting: Gynecologic Oncology

## 2020-11-21 ENCOUNTER — Telehealth: Payer: Self-pay | Admitting: *Deleted

## 2020-11-21 NOTE — Telephone Encounter (Signed)
Patient scheduled on 11/22/20 with Dr.Rossi.

## 2020-11-21 NOTE — Telephone Encounter (Signed)
Scheduled the patient for a new patient appt tomorrow at 11:15 am; patient to arrive at 10:45 am. Patient given the address and phone number for the clinic. Patient also given the policy for mask and visitors

## 2020-11-22 ENCOUNTER — Inpatient Hospital Stay: Payer: BC Managed Care – PPO | Attending: Gynecologic Oncology | Admitting: Gynecologic Oncology

## 2020-11-22 ENCOUNTER — Other Ambulatory Visit: Payer: Self-pay

## 2020-11-22 ENCOUNTER — Encounter: Payer: Self-pay | Admitting: Gynecologic Oncology

## 2020-11-22 VITALS — BP 126/59 | HR 71 | Temp 97.4°F | Resp 18 | Ht 66.5 in | Wt 212.7 lb

## 2020-11-22 DIAGNOSIS — I10 Essential (primary) hypertension: Secondary | ICD-10-CM | POA: Insufficient documentation

## 2020-11-22 DIAGNOSIS — D071 Carcinoma in situ of vulva: Secondary | ICD-10-CM | POA: Diagnosis not present

## 2020-11-22 DIAGNOSIS — Z8616 Personal history of COVID-19: Secondary | ICD-10-CM | POA: Insufficient documentation

## 2020-11-22 DIAGNOSIS — D869 Sarcoidosis, unspecified: Secondary | ICD-10-CM | POA: Diagnosis not present

## 2020-11-22 DIAGNOSIS — A630 Anogenital (venereal) warts: Secondary | ICD-10-CM | POA: Insufficient documentation

## 2020-11-22 DIAGNOSIS — B009 Herpesviral infection, unspecified: Secondary | ICD-10-CM | POA: Diagnosis not present

## 2020-11-22 DIAGNOSIS — Z79899 Other long term (current) drug therapy: Secondary | ICD-10-CM | POA: Diagnosis not present

## 2020-11-22 DIAGNOSIS — E785 Hyperlipidemia, unspecified: Secondary | ICD-10-CM | POA: Diagnosis not present

## 2020-11-22 MED ORDER — SENNOSIDES-DOCUSATE SODIUM 8.6-50 MG PO TABS: 2.0000 | ORAL_TABLET | Freq: Every day | ORAL | 0 refills | Status: DC

## 2020-11-22 MED ORDER — IBUPROFEN 800 MG PO TABS: 800.0000 mg | ORAL_TABLET | Freq: Three times a day (TID) | ORAL | 0 refills | Status: DC | PRN

## 2020-11-22 MED ORDER — TRAMADOL HCL 50 MG PO TABS: 50.0000 mg | ORAL_TABLET | Freq: Four times a day (QID) | ORAL | 0 refills | Status: DC | PRN

## 2020-11-22 NOTE — Progress Notes (Signed)
Consult Note: Gyn-Onc  Consult was requested by Kelly Dixon, NP for the evaluation of Danielle Sanford 59 y.o. female  CC:  Chief Complaint  Patient presents with  . VIN III (vulvar intraepithelial neoplasia III)    Assessment/Plan:  Danielle Sanford  is a 59 y.o.  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  I am recommending CO2 laser to ablate the dysplasia given its extensive nature and my lack of suspicion for invasive carcinoma.  I offered excision of the condyloma in the labial crural folds but explained to remove additional incisions.  I explained that these lesions do not appear dysplastic, and therefore the patient declined this additional procedure.  We will however biopsy these lesions in a representative way to ensure that there condylomatous only.  I explained the procedure to the patient and anticipated recovery and postoperative care.  I explained the importance of avoidance of intercourse postoperatively and the risk of disfigurement and vaginal introitus narrowing.  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   HPI: Danielle Sanford is a 59 year old P0 who was seen in consultation at the request of Kelly Dixon, NP for evaluation of VIN II-III.  The patient reported a several month history of vulvar irritation in the winter 2022.  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  She saw her Kelly Dixon her nurse practitioner for an evaluation on November 14, 2020.  Danielle. Dixon evaluated the vulva and identified multiple condyloma, and flat warts on the external genitalia of both the right and left inner labia.  She took representative biopsies from both the left and right labia minora.   This was performed on 11/14/2020.  It was resulted as VIN 2-3.  Her medical history is most significant for sarcoidosis for which she takes Plaquenil.  Her surgical history is most significant for a total abdominal hysterectomy without BSO at age 37 for a history of dysplasia of the cervix.  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  Her family cancer history is unremarkable for malignancy.  She is currently unemployed. She lives with her husband. She has never used tobacco products.   Current Meds:  Outpatient Encounter Medications as of 11/22/2020  Medication Sig  . atorvastatin (LIPITOR) 20 MG tablet TAKE 1 TABLET DAILY  . hydroxychloroquine (PLAQUENIL) 200 MG tablet Take 200 mg by mouth 2 (two) times daily.  . levothyroxine (SYNTHROID) 150 MCG tablet Take 1 tablet (150 mcg total) by mouth daily.  . lidocaine (XYLOCAINE) 5 % ointment Apply 1 application topically 3 (three) times daily.  . propranolol (INDERAL) 10 MG tablet Take 1 tablet (10 mg total) by mouth 3 (three) times daily. (Patient taking differently: Take 10 mg by mouth 2 (two) times daily.)  . Clobetasol Prop Emollient Base (CLOBETASOL PROPIONATE E) 0.05 % emollient cream Apply 1 application topically 2 (two) times daily as needed. (Patient not taking: Reported on 11/21/2020)  . mupirocin ointment (BACTROBAN) 2 % Apply topically as needed. (Patient not taking: Reported on 11/21/2020)  . triamcinolone (KENALOG) 0.025 % cream Apply 1 application topically 2 (two) times daily as needed. Apply to areas of dry, cracked skin on hands (Patient not taking:   Reported on 11/21/2020)  . [DISCONTINUED] mometasone (ELOCON) 0.1 % cream as needed.  . [DISCONTINUED] valACYclovir (VALTREX) 500 MG tablet Take 1 tablet (500 mg total) by mouth 2 (two) times daily.   No facility-administered encounter medications on file as of 11/22/2020.    Allergy: No Known Allergies  Social Hx:   Social History   Socioeconomic  History  . Marital status: Married    Spouse name: Not on file  . Number of children: Not on file  . Years of education: Not on file  . Highest education level: Not on file  Occupational History  . Not on file  Tobacco Use  . Smoking status: Never Smoker  . Smokeless tobacco: Never Used  Vaping Use  . Vaping Use: Never used  Substance and Sexual Activity  . Alcohol use: No    Alcohol/week: 0.0 standard drinks  . Drug use: No  . Sexual activity: Not Currently    Partners: Male    Birth control/protection: Surgical    Comment: hysterectomy  Other Topics Concern  . Not on file  Social History Narrative  . Not on file   Social Determinants of Health   Financial Resource Strain: Not on file  Food Insecurity: Not on file  Transportation Needs: Not on file  Physical Activity: Not on file  Stress: Not on file  Social Connections: Not on file  Intimate Partner Violence: Not on file    Past Surgical Hx:  Past Surgical History:  Procedure Laterality Date  . ABDOMINAL HYSTERECTOMY    . BREAST EXCISIONAL BIOPSY Left 1999  . BREAST SURGERY    . TEAR DUCT PROBING      Past Medical Hx:  Past Medical History:  Diagnosis Date  . Abnormal Pap smear of vagina   . Allergy   . COVID-19   . Hyperlipidemia   . Hypertension   . Sarcoid   . STD (sexually transmitted disease)    HSV, HPV  . Tachycardia   . Thyroid disease     Past Gynecological History:  See HPI, chronic HPV infection. No LMP recorded (lmp unknown). Patient has had a hysterectomy.  Family Hx:  Family History  Problem Relation Age of Onset  . Pancreatic cancer Mother   . Diabetes Father   . Thyroid disease Father   . Colon cancer Maternal Grandmother   . Cancer Maternal Grandfather        prostate  . Stroke Paternal Grandfather   . Breast cancer Maternal Aunt 80  . Prostate cancer Neg Hx   . Ovarian cancer Neg Hx   . Uterine cancer Neg Hx     Review of Systems:  Constitutional  Feels well,     ENT Normal appearing ears and nares bilaterally Skin/Breast  No rash, sores, jaundice, itching, dryness Cardiovascular  No chest pain, shortness of breath, or edema  Pulmonary  No cough or wheeze.  Gastro Intestinal  No nausea, vomitting, or diarrhoea. No bright red blood per rectum, no abdominal pain, change in bowel movement, or constipation.  Genito Urinary  No frequency, urgency, dysuria, + vulvar irritation  Musculo Skeletal  No myalgia, arthralgia, joint swelling or pain  Neurologic  No weakness, numbness, change in gait,  Psychology  No depression, anxiety, insomnia.   Vitals:  Blood pressure (!) 126/59, pulse 71, temperature (!) 97.4 F (36.3 C), temperature source Tympanic, resp. rate 18, height 5' 6.5" (1.689 m), weight 212 lb 11.2 oz (96.5 kg), SpO2 99 %.  Physical Exam: WD  in NAD Neck  Supple NROM, without any enlargements.  Lymph Node Survey No cervical supraclavicular or inguinal adenopathy Cardiovascular  Pulse normal rate, regularity and rhythm. S1 and S2 normal.  Lungs  Clear to auscultation bilateraly, without wheezes/crackles/rhonchi. Good air movement.  Skin  No rash/lesions/breakdown  Psychiatry  Alert and oriented to person, place, and time  Abdomen  Normoactive bowel sounds, abdomen soft, non-tender and obese without evidence of hernia. Back No CVA tenderness Genito Urinary  Vulva/vagina: acetic acid applied.  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 Rectal  deferred  Extremities  No bilateral cyanosis, clubbing or edema.  60 minutes of total time was spent for this patient encounter, including preparation, face-to-face counseling with the patient and coordination of care, review of imaging (results and images), communication with the referring provider and  documentation of the encounter.   Luisa Dago, MD  11/22/2020, 11:35 AM

## 2020-11-22 NOTE — Patient Instructions (Addendum)
Preparing for your Surgery  Plan for surgery on November 30, 2020 with Dr. Adolphus Birchwood at [facility]. You will be scheduled for a vulvar laser and excision of warty lesion.   We recommend purchasing several bags of frozen green peas and dividing them into ziploc bags. You will want to keep these in the freezer and have them ready to use as ice packs to the vulvar incision. Once the ice pack is no longer cold, you can get another from the freezer. The frozen peas mold to your body better than a regular ice pack.  Pre-operative Testing -You will receive a phone call from presurgical testing at [facility] to discuss preoperative instructions, labs if needed, and COVID test. The COVID test normally happens 3 days prior to the surgery and they ask that you self quarantine after the test up until surgery to decrease chance of exposure.  -Bring your insurance card, copy of an advanced directive if applicable, medication list  Day Before Surgery at Home -You will be advised you can have clear liquids up until 3 hours before your surgery.    Your role in recovery Your role is to become active as soon as directed by your doctor, while still giving yourself time to heal.  Rest when you feel tired. You will be asked to do the following in order to speed your recovery:  - Cough and breathe deeply. This helps to clear and expand your lungs and can prevent pneumonia after surgery.  - STAY ACTIVE WHEN YOU GET HOME. Do mild physical activity. Walking or moving your legs help your circulation and body functions return to normal. Do not try to get up or walk alone the first time after surgery.   -If you develop swelling on one leg or the other, pain in the back of your leg, redness/warmth in one of your legs, please call the office or go to the Emergency Room to have a doppler to rule out a blood clot. For shortness of breath, chest pain-seek care in the Emergency Room  as soon as possible. - Actively manage your pain. Managing your pain lets you move in comfort. We will ask you to rate your pain on a scale of zero to 10. It is your responsibility to tell your doctor or nurse where and how much you hurt so your pain can be treated.  Make sure you have Tylenol at home to use for mild pain. You can alternate this with ibuprofen so you are taking one or the other every 6-8 hours for pain. You will have the stronger pain medication if needed.  Bowel Regimen -It is important to prevent constipation and drink adequate amounts of liquids. You will be prescribed sennakot-S to take nightly while you are taking the pain medication to prevent constipation.   Risks of Surgery Risks of surgery are low but include bleeding, infection, damage to surrounding structures, re-operation, blood clots, and very rarely death.  Reasons to call the Doctor:  Fever - Oral temperature greater than 100.4 degrees Fahrenheit  Foul-smelling vaginal discharge  Difficulty urinating  Nausea and vomiting  Increased pain at the site of the incision that is unrelieved with pain medicine.  Difficulty breathing with or without chest pain  New calf pain especially if only on one side  Sudden, continuing increased vaginal bleeding with or without clots.   Contacts: For questions or concerns you should contact:  Dr. Adolphus Birchwood at [phone]  [facility],  NP at [phone]  After Hours: call [phone] and have the GYN Oncologist paged/contacted (after 5 pm or on the weekends).  Messages sent via mychart are for non-urgent matters and are not responded to after hours so for urgent needs, please call the after hours number.  Post Operative Instructions Following Laser Surgery  Laser treatment of condyloma (warts) is used to vaporize or eliminate the wart.  The laser actually creates a burn effect on the skin to accomplish this.  The following instructions will help in  you comfort postoperatively:  First 24 h  Sit in a tub or sitz bath of COOL water for 20 minutes 1-3 times a day.  After the bath, carefully blot the area dry and place Silvadene over vulva.  You may sit on a covered ice pack between the baths as needed or on a circular pillow.     If you become uncomfortable, call the office for instructions.   Take your pain medications as prescribed  You may eat whatever you feel like.  Start with a light meal and gradually advance your diet.    Beginning the day after your surgery  Sit in a tub of cool to warm water for 15 minutes at least 3 times a day and after bowel movements  After the bowel movement, clean and dry the area.  Apply Silvadene to the vulva after your baths.    Drainage is usually a pink to tan color and is normal for the following 2-3 weeks after surgery. You can use a peripad to help collect any drainage.  Diet  Eat a regular diet.  Avoid foods that may constipate you or give you diarrhea.  Avoid foods with seeds, nuts, corn or popcorn.  Beginning the day after surgery, drink 6-8 glasses of water a day in addition to your meals.  Limit you caffeine intake to 1-2 servings per day.  Medication  Take a fiber supplement (Metamucil, Citrucel, FiberCon) twice a day.  Take a stool softener like Colace twice daily  Take your pain medication as directed.  Avoid products containing aspirin.  Bowel Habits  You should have a bowel movement at least every other day.  If you are constipated, you may take a Fleet enema or 2 Dulcolax tablets.  Call the office if no results occur.  You may bear down a normal amount to have a bowel movement without hurting your tissues after the operation.  Activity  Walking is encouraged.  You may drive when you are no longer on prescription pain medication.  You may go up and down stairs carefully.  No heavy lifting or strenuous activity until after your first post operative  appointment  Do NOT sit on a rubber ring; instead use a soft pillow if needed.  Call the office if you have any questions or concerns.  Call IMMEDIATELY if you develop:  Fever greater than 100 F  Difficulty urinating

## 2020-11-22 NOTE — H&P (View-Only) (Signed)
Consult Note: Gyn-Onc  Consult was requested by Clarita Crane, NP for the evaluation of Danielle Sanford 60 y.o. female  CC:  Chief Complaint  Patient presents with  . VIN III (vulvar intraepithelial neoplasia III)    Assessment/Plan:  Danielle Sanford  is a 60 y.o.  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  I am recommending CO2 laser to ablate the dysplasia given its extensive nature and my lack of suspicion for invasive carcinoma.  I offered excision of the condyloma in the labial crural folds but explained to remove additional incisions.  I explained that these lesions do not appear dysplastic, and therefore the patient declined this additional procedure.  We will however biopsy these lesions in a representative way to ensure that there condylomatous only.  I explained the procedure to the patient and anticipated recovery and postoperative care.  I explained the importance of avoidance of intercourse postoperatively and the risk of disfigurement and vaginal introitus narrowing.  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   HPI: [name] is a 60 year old P0 who was seen in consultation at the request of Clarita Crane, NP for evaluation of VIN II-III.  The patient reported a several month history of vulvar irritation in the winter 2022.  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  She saw her Clarita Crane her nurse practitioner for an evaluation on November 14, 2020.  [name] evaluated the vulva and identified multiple condyloma, and flat warts on the external genitalia of both the right and left inner labia.  She took representative biopsies from both the left and right labia minora.   This was performed on 11/14/2020.  It was resulted as VIN 2-3.  Her medical history is most significant for sarcoidosis for which she takes Plaquenil.  Her surgical history is most significant for a total abdominal hysterectomy without BSO at age 84 for a history of dysplasia of the cervix.  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  Her family cancer history is unremarkable for malignancy.  She is currently unemployed. She lives with her husband. She has never used tobacco products.   Current Meds:  Outpatient Encounter Medications as of 11/22/2020  Medication Sig  . atorvastatin (LIPITOR) 20 MG tablet TAKE 1 TABLET DAILY  . hydroxychloroquine (PLAQUENIL) 200 MG tablet Take 200 mg by mouth 2 (two) times daily.  Marland Kitchen levothyroxine (SYNTHROID) 150 MCG tablet Take 1 tablet (150 mcg total) by mouth daily.  Marland Kitchen lidocaine (XYLOCAINE) 5 % ointment Apply 1 application topically 3 (three) times daily.  . propranolol (INDERAL) 10 MG tablet Take 1 tablet (10 mg total) by mouth 3 (three) times daily. (Patient taking differently: Take 10 mg by mouth 2 (two) times daily.)  . Clobetasol Prop Emollient Base (CLOBETASOL PROPIONATE E) 0.05 % emollient cream Apply 1 application topically 2 (two) times daily as needed. (Patient not taking: Reported on 11/21/2020)  . mupirocin ointment (BACTROBAN) 2 % Apply topically as needed. (Patient not taking: Reported on 11/21/2020)  . triamcinolone (KENALOG) 0.025 % cream Apply 1 application topically 2 (two) times daily as needed. Apply to areas of dry, cracked skin on hands (Patient not taking:  Reported on 11/21/2020)  . [DISCONTINUED] mometasone (ELOCON) 0.1 % cream as needed.  . [DISCONTINUED] valACYclovir (VALTREX) 500 MG tablet Take 1 tablet (500 mg total) by mouth 2 (two) times daily.   No facility-administered encounter medications on file as of 11/22/2020.    Allergy: No Known Allergies  Social Hx:   Social History   Socioeconomic  History  . Marital status: Married    Spouse name: Not on file  . Number of children: Not on file  . Years of education: Not on file  . Highest education level: Not on file  Occupational History  . Not on file  Tobacco Use  . Smoking status: Never Smoker  . Smokeless tobacco: Never Used  Vaping Use  . Vaping Use: Never used  Substance and Sexual Activity  . Alcohol use: No    Alcohol/week: 0.0 standard drinks  . Drug use: No  . Sexual activity: Not Currently    Partners: Male    Birth control/protection: Surgical    Comment: hysterectomy  Other Topics Concern  . Not on file  Social History Narrative  . Not on file   Social Determinants of Health   Financial Resource Strain: Not on file  Food Insecurity: Not on file  Transportation Needs: Not on file  Physical Activity: Not on file  Stress: Not on file  Social Connections: Not on file  Intimate Partner Violence: Not on file    Past Surgical Hx:  Past Surgical History:  Procedure Laterality Date  . ABDOMINAL HYSTERECTOMY    . BREAST EXCISIONAL BIOPSY Left 1999  . BREAST SURGERY    . TEAR DUCT PROBING      Past Medical Hx:  Past Medical History:  Diagnosis Date  . Abnormal Pap smear of vagina   . Allergy   . COVID-19   . Hyperlipidemia   . Hypertension   . Sarcoid   . STD (sexually transmitted disease)    HSV, HPV  . Tachycardia   . Thyroid disease     Past Gynecological History:  See HPI, chronic HPV infection. No LMP recorded (lmp unknown). Patient has had a hysterectomy.  Family Hx:  Family History  Problem Relation Age of Onset  . Pancreatic cancer Mother   . Diabetes Father   . Thyroid disease Father   . Colon cancer Maternal Grandmother   . Cancer Maternal Grandfather        prostate  . Stroke Paternal Grandfather   . Breast cancer Maternal Aunt 80  . Prostate cancer Neg Hx   . Ovarian cancer Neg Hx   . Uterine cancer Neg Hx     Review of Systems:  Constitutional  Feels well,     ENT Normal appearing ears and nares bilaterally Skin/Breast  No rash, sores, jaundice, itching, dryness Cardiovascular  No chest pain, shortness of breath, or edema  Pulmonary  No cough or wheeze.  Gastro Intestinal  No nausea, vomitting, or diarrhoea. No bright red blood per rectum, no abdominal pain, change in bowel movement, or constipation.  Genito Urinary  No frequency, urgency, dysuria, + vulvar irritation  Musculo Skeletal  No myalgia, arthralgia, joint swelling or pain  Neurologic  No weakness, numbness, change in gait,  Psychology  No depression, anxiety, insomnia.   Vitals:  Blood pressure (!) 126/59, pulse 71, temperature (!) 97.4 F (36.3 C), temperature source Tympanic, resp. rate 18, height 5' 6.5" (1.689 m), weight 212 lb 11.2 oz (96.5 kg), SpO2 99 %.  Physical Exam: WD  in NAD Neck  Supple NROM, without any enlargements.  Lymph Node Survey No cervical supraclavicular or inguinal adenopathy Cardiovascular  Pulse normal rate, regularity and rhythm. S1 and S2 normal.  Lungs  Clear to auscultation bilateraly, without wheezes/crackles/rhonchi. Good air movement.  Skin  No rash/lesions/breakdown  Psychiatry  Alert and oriented to person, place, and time  Abdomen  Normoactive bowel sounds, abdomen soft, non-tender and obese without evidence of hernia. Back No CVA tenderness Genito Urinary  Vulva/vagina: acetic acid applied.  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 Rectal  deferred  Extremities  No bilateral cyanosis, clubbing or edema.  60 minutes of total time was spent for this patient encounter, including preparation, face-to-face counseling with the patient and coordination of care, review of imaging (results and images), communication with the referring provider and  documentation of the encounter.   Luisa Dago, MD  11/22/2020, 11:35 AM

## 2020-11-23 ENCOUNTER — Other Ambulatory Visit: Payer: Self-pay | Admitting: Gynecologic Oncology

## 2020-11-23 ENCOUNTER — Other Ambulatory Visit: Payer: Self-pay | Admitting: Nurse Practitioner

## 2020-11-23 DIAGNOSIS — D071 Carcinoma in situ of vulva: Secondary | ICD-10-CM

## 2020-11-24 ENCOUNTER — Encounter (HOSPITAL_BASED_OUTPATIENT_CLINIC_OR_DEPARTMENT_OTHER): Payer: Self-pay | Admitting: Gynecologic Oncology

## 2020-11-25 ENCOUNTER — Other Ambulatory Visit: Payer: Self-pay

## 2020-11-25 ENCOUNTER — Encounter (HOSPITAL_BASED_OUTPATIENT_CLINIC_OR_DEPARTMENT_OTHER): Payer: Self-pay | Admitting: Gynecologic Oncology

## 2020-11-25 NOTE — Progress Notes (Signed)
Spoke w/ via phone for pre-op interview--- PT Lab needs dos---- no              Lab results------ pt getting CBC, BMP, EKG done 11-28-2020 @ 1400 COVID test ------ 11-28-2020 @ 1455 Arrive at ------- 1045 on 11-30-2020 NPO after MN NO Solid Food.  Clear liquids from MN until--- 0945 Med rec completed Medications to take morning of surgery ----- Lipitor, Plaquenil, Propranolol, Synthroid Diabetic medication ----- n/a Patient instructed to bring photo id and insurance card day of surgery Patient aware to have Driver (ride ) / caregiver    for 24 hours after surgery-- husband, Darrell  Patient Special Instructions ----- n/a Pre-Op special Istructions ----- n/a Patient verbalized understanding of instructions that were given at this phone interview. Patient denies shortness of breath, chest pain, fever, cough at this phone interview.   Anesthesia :  HTN;  Tachycardia;  Sarcoidosis of skin. Pt denies any cardiac s&s, sob, and no peripheral swelling.  PCP:  Aura Dials NP (lov 10-24-2020 epic) Dermatologist:  Dr T. Roseanne Reno (lov 08-17-2020 epic) EKG :  no

## 2020-11-28 ENCOUNTER — Encounter (HOSPITAL_BASED_OUTPATIENT_CLINIC_OR_DEPARTMENT_OTHER)
Admission: RE | Admit: 2020-11-28 | Discharge: 2020-11-28 | Disposition: A | Discharge status: Home / Self Care | Payer: BC Managed Care – PPO | Source: Ambulatory Visit | Attending: Gynecologic Oncology | Admitting: Gynecologic Oncology

## 2020-11-28 ENCOUNTER — Other Ambulatory Visit: Payer: Self-pay

## 2020-11-28 ENCOUNTER — Other Ambulatory Visit (HOSPITAL_COMMUNITY)
Admission: RE | Admit: 2020-11-28 | Discharge: 2020-11-28 | Disposition: A | Discharge status: Home / Self Care | Payer: BC Managed Care – PPO | Source: Ambulatory Visit | Attending: Gynecologic Oncology | Admitting: Gynecologic Oncology

## 2020-11-28 DIAGNOSIS — N901 Moderate vulvar dysplasia: Secondary | ICD-10-CM | POA: Diagnosis not present

## 2020-11-28 DIAGNOSIS — Z01818 Encounter for other preprocedural examination: Secondary | ICD-10-CM

## 2020-11-28 DIAGNOSIS — Z7951 Long term (current) use of inhaled steroids: Secondary | ICD-10-CM | POA: Diagnosis not present

## 2020-11-28 DIAGNOSIS — Z20822 Contact with and (suspected) exposure to covid-19: Secondary | ICD-10-CM | POA: Insufficient documentation

## 2020-11-28 DIAGNOSIS — Z01812 Encounter for preprocedural laboratory examination: Secondary | ICD-10-CM | POA: Insufficient documentation

## 2020-11-28 DIAGNOSIS — Z803 Family history of malignant neoplasm of breast: Secondary | ICD-10-CM | POA: Diagnosis not present

## 2020-11-28 DIAGNOSIS — L821 Other seborrheic keratosis: Secondary | ICD-10-CM | POA: Diagnosis not present

## 2020-11-28 DIAGNOSIS — D071 Carcinoma in situ of vulva: Secondary | ICD-10-CM

## 2020-11-28 DIAGNOSIS — A630 Anogenital (venereal) warts: Secondary | ICD-10-CM | POA: Diagnosis not present

## 2020-11-28 DIAGNOSIS — Z8349 Family history of other endocrine, nutritional and metabolic diseases: Secondary | ICD-10-CM | POA: Diagnosis not present

## 2020-11-28 DIAGNOSIS — D869 Sarcoidosis, unspecified: Secondary | ICD-10-CM | POA: Diagnosis not present

## 2020-11-28 DIAGNOSIS — Z8042 Family history of malignant neoplasm of prostate: Secondary | ICD-10-CM | POA: Diagnosis not present

## 2020-11-28 DIAGNOSIS — Z800 Family history of malignant neoplasm of digestive organs: Secondary | ICD-10-CM | POA: Diagnosis not present

## 2020-11-28 DIAGNOSIS — Z833 Family history of diabetes mellitus: Secondary | ICD-10-CM | POA: Diagnosis not present

## 2020-11-28 DIAGNOSIS — Z79899 Other long term (current) drug therapy: Secondary | ICD-10-CM | POA: Diagnosis not present

## 2020-11-28 LAB — CBC
HCT: 41.4 % (ref 36.0–46.0)
Hemoglobin: 12.7 g/dL (ref 12.0–15.0)
MCH: 25.6 pg — ABNORMAL LOW (ref 26.0–34.0)
MCHC: 30.7 g/dL (ref 30.0–36.0)
MCV: 83.3 fL (ref 80.0–100.0)
Platelets: 196 10*3/uL (ref 150–400)
RBC: 4.97 MIL/uL (ref 3.87–5.11)
RDW: 14.1 % (ref 11.5–15.5)
WBC: 4.5 10*3/uL (ref 4.0–10.5)
nRBC: 0 % (ref 0.0–0.2)

## 2020-11-28 LAB — BASIC METABOLIC PANEL
Anion gap: 8 (ref 5–15)
BUN: 11 mg/dL (ref 6–20)
CO2: 26 mmol/L (ref 22–32)
Calcium: 9.2 mg/dL (ref 8.9–10.3)
Chloride: 105 mmol/L (ref 98–111)
Creatinine, Ser: 0.87 mg/dL (ref 0.44–1.00)
GFR, Estimated: >60 mL/min (ref >60)
Glucose, Bld: 89 mg/dL (ref 70–99)
Potassium: 4.6 mmol/L (ref 3.5–5.1)
Sodium: 139 mmol/L (ref 135–145)

## 2020-11-29 ENCOUNTER — Telehealth: Payer: Self-pay

## 2020-11-29 LAB — SARS CORONAVIRUS 2 (TAT 6-24 HRS): SARS Coronavirus 2: NEGATIVE

## 2020-11-29 NOTE — Telephone Encounter (Signed)
Danielle Sanford states that she understands her pre-op instructions  as discussed  With Danielle Henderson Newcomer on 11-25-20. Told her she can use the nettie pot in the am but bring her nasal spray to short stay and ask if she can use prior to surgery.  Pt verbalized understanding.

## 2020-11-30 ENCOUNTER — Encounter (HOSPITAL_BASED_OUTPATIENT_CLINIC_OR_DEPARTMENT_OTHER): Payer: Self-pay | Admitting: Gynecologic Oncology

## 2020-11-30 ENCOUNTER — Ambulatory Visit (HOSPITAL_BASED_OUTPATIENT_CLINIC_OR_DEPARTMENT_OTHER): Payer: BC Managed Care – PPO | Admitting: Anesthesiology

## 2020-11-30 ENCOUNTER — Ambulatory Visit (HOSPITAL_BASED_OUTPATIENT_CLINIC_OR_DEPARTMENT_OTHER)
Admission: RE | Admit: 2020-11-30 | Discharge: 2020-11-30 | Disposition: A | Discharge status: Home / Self Care | Payer: BC Managed Care – PPO | Source: Ambulatory Visit | Attending: Gynecologic Oncology | Admitting: Gynecologic Oncology

## 2020-11-30 ENCOUNTER — Encounter (HOSPITAL_BASED_OUTPATIENT_CLINIC_OR_DEPARTMENT_OTHER)
Admission: RE | Disposition: A | Discharge status: Home / Self Care | Payer: Self-pay | Source: Ambulatory Visit | Attending: Gynecologic Oncology

## 2020-11-30 DIAGNOSIS — Z833 Family history of diabetes mellitus: Secondary | ICD-10-CM | POA: Diagnosis not present

## 2020-11-30 DIAGNOSIS — N901 Moderate vulvar dysplasia: Secondary | ICD-10-CM | POA: Diagnosis not present

## 2020-11-30 DIAGNOSIS — A630 Anogenital (venereal) warts: Secondary | ICD-10-CM | POA: Diagnosis not present

## 2020-11-30 DIAGNOSIS — Z803 Family history of malignant neoplasm of breast: Secondary | ICD-10-CM | POA: Diagnosis not present

## 2020-11-30 DIAGNOSIS — Z79899 Other long term (current) drug therapy: Secondary | ICD-10-CM | POA: Diagnosis not present

## 2020-11-30 DIAGNOSIS — D071 Carcinoma in situ of vulva: Secondary | ICD-10-CM | POA: Diagnosis not present

## 2020-11-30 DIAGNOSIS — L821 Other seborrheic keratosis: Secondary | ICD-10-CM | POA: Diagnosis not present

## 2020-11-30 DIAGNOSIS — Z800 Family history of malignant neoplasm of digestive organs: Secondary | ICD-10-CM | POA: Diagnosis not present

## 2020-11-30 DIAGNOSIS — I10 Essential (primary) hypertension: Secondary | ICD-10-CM | POA: Diagnosis not present

## 2020-11-30 DIAGNOSIS — Z7951 Long term (current) use of inhaled steroids: Secondary | ICD-10-CM | POA: Insufficient documentation

## 2020-11-30 DIAGNOSIS — J309 Allergic rhinitis, unspecified: Secondary | ICD-10-CM | POA: Diagnosis not present

## 2020-11-30 DIAGNOSIS — Z8042 Family history of malignant neoplasm of prostate: Secondary | ICD-10-CM | POA: Insufficient documentation

## 2020-11-30 DIAGNOSIS — Z20822 Contact with and (suspected) exposure to covid-19: Secondary | ICD-10-CM | POA: Diagnosis not present

## 2020-11-30 DIAGNOSIS — Z8349 Family history of other endocrine, nutritional and metabolic diseases: Secondary | ICD-10-CM | POA: Insufficient documentation

## 2020-11-30 DIAGNOSIS — D869 Sarcoidosis, unspecified: Secondary | ICD-10-CM | POA: Insufficient documentation

## 2020-11-30 DIAGNOSIS — D072 Carcinoma in situ of vagina: Secondary | ICD-10-CM | POA: Diagnosis not present

## 2020-11-30 HISTORY — DX: Personal history of colonic polyps: Z86.010

## 2020-11-30 HISTORY — DX: Hypothyroidism, unspecified: E03.9

## 2020-11-30 HISTORY — DX: Personal history of cervical dysplasia: Z87.410

## 2020-11-30 HISTORY — DX: Stress incontinence (female) (male): N39.3

## 2020-11-30 HISTORY — PX: CO2 LASER APPLICATION: SHX5778

## 2020-11-30 HISTORY — DX: Unspecified disorder of nose and nasal sinuses: J34.9

## 2020-11-30 HISTORY — DX: Presence of spectacles and contact lenses: Z97.3

## 2020-11-30 HISTORY — DX: Personal history of vaginal dysplasia: Z87.411

## 2020-11-30 HISTORY — DX: Personal history of adenomatous and serrated colon polyps: Z86.0101

## 2020-11-30 HISTORY — PX: VULVAR LESION REMOVAL: SHX5391

## 2020-11-30 HISTORY — DX: Carcinoma in situ of vulva: D07.1

## 2020-11-30 HISTORY — DX: Sarcoidosis of skin: D86.3

## 2020-11-30 SURGERY — CO2 LASER APPLICATION
Anesthesia: General | Site: Vulva

## 2020-11-30 MED ORDER — OXYCODONE HCL 5 MG/5ML PO SOLN: 5.0000 mg | Freq: Once | ORAL | Status: DC | PRN

## 2020-11-30 MED ORDER — BUPIVACAINE LIPOSOME 1.3 % IJ SUSP
INTRAMUSCULAR | Status: DC | PRN
  Administered 2020-11-30: 20 mL

## 2020-11-30 MED ORDER — PROMETHAZINE HCL 25 MG/ML IJ SOLN
6.2500 mg | INTRAMUSCULAR | Status: DC | PRN
Start: 2020-11-30 — End: 2020-11-30

## 2020-11-30 MED ORDER — GABAPENTIN 300 MG PO CAPS
300.0000 mg | ORAL_CAPSULE | ORAL | Status: AC
  Administered 2020-11-30: 300 mg via ORAL

## 2020-11-30 MED ORDER — PHENYLEPHRINE 40 MCG/ML (10ML) SYRINGE FOR IV PUSH (FOR BLOOD PRESSURE SUPPORT)
PREFILLED_SYRINGE | INTRAVENOUS | Status: AC
  Filled 2020-11-30: qty 10

## 2020-11-30 MED ORDER — ONDANSETRON HCL 4 MG/2ML IJ SOLN
INTRAMUSCULAR | Status: DC | PRN
  Administered 2020-11-30: 4 mg via INTRAVENOUS

## 2020-11-30 MED ORDER — ACETIC ACID 5 % SOLN
Status: DC | PRN
  Administered 2020-11-30: 1 via TOPICAL

## 2020-11-30 MED ORDER — MIDAZOLAM HCL 2 MG/2ML IJ SOLN
INTRAMUSCULAR | Status: DC | PRN
  Administered 2020-11-30: 2 mg via INTRAVENOUS

## 2020-11-30 MED ORDER — SCOPOLAMINE 1 MG/3DAYS TD PT72
MEDICATED_PATCH | TRANSDERMAL | Status: AC
  Filled 2020-11-30: qty 1

## 2020-11-30 MED ORDER — DEXAMETHASONE SODIUM PHOSPHATE 10 MG/ML IJ SOLN
4.0000 mg | INTRAMUSCULAR | Status: AC
  Administered 2020-11-30: 4 mg via INTRAVENOUS

## 2020-11-30 MED ORDER — OXYCODONE HCL 5 MG PO TABS: 5.0000 mg | ORAL_TABLET | ORAL | Status: DC | PRN

## 2020-11-30 MED ORDER — ACETAMINOPHEN 500 MG PO TABS
1000.0000 mg | ORAL_TABLET | ORAL | Status: AC
  Administered 2020-11-30: 1000 mg via ORAL

## 2020-11-30 MED ORDER — FENTANYL CITRATE (PF) 100 MCG/2ML IJ SOLN
INTRAMUSCULAR | Status: DC | PRN
  Administered 2020-11-30 (×4): 25 ug via INTRAVENOUS

## 2020-11-30 MED ORDER — TRAMADOL HCL 50 MG PO TABS: 50.0000 mg | ORAL_TABLET | Freq: Four times a day (QID) | ORAL | Status: DC | PRN

## 2020-11-30 MED ORDER — HYDROMORPHONE HCL 1 MG/ML IJ SOLN: 0.2000 mg | INTRAMUSCULAR | Status: DC | PRN

## 2020-11-30 MED ORDER — OXYCODONE HCL 5 MG PO TABS
5.0000 mg | ORAL_TABLET | Freq: Once | ORAL | Status: DC | PRN
Start: 2020-11-30 — End: 2020-11-30

## 2020-11-30 MED ORDER — ACETAMINOPHEN 500 MG PO TABS
ORAL_TABLET | ORAL | Status: AC
  Filled 2020-11-30: qty 2

## 2020-11-30 MED ORDER — LIDOCAINE 2% (20 MG/ML) 5 ML SYRINGE
INTRAMUSCULAR | Status: DC | PRN
  Administered 2020-11-30: 60 mg via INTRAVENOUS

## 2020-11-30 MED ORDER — MIDAZOLAM HCL 2 MG/2ML IJ SOLN
INTRAMUSCULAR | Status: AC
  Filled 2020-11-30: qty 2

## 2020-11-30 MED ORDER — PROPOFOL 10 MG/ML IV BOLUS
INTRAVENOUS | Status: DC | PRN
  Administered 2020-11-30: 200 mg via INTRAVENOUS

## 2020-11-30 MED ORDER — PHENYLEPHRINE 40 MCG/ML (10ML) SYRINGE FOR IV PUSH (FOR BLOOD PRESSURE SUPPORT)
PREFILLED_SYRINGE | INTRAVENOUS | Status: DC | PRN
  Administered 2020-11-30: 80 ug via INTRAVENOUS

## 2020-11-30 MED ORDER — FENTANYL CITRATE (PF) 100 MCG/2ML IJ SOLN
INTRAMUSCULAR | Status: AC
  Filled 2020-11-30: qty 2

## 2020-11-30 MED ORDER — ONDANSETRON HCL 4 MG PO TABS: 4.0000 mg | ORAL_TABLET | Freq: Four times a day (QID) | ORAL | Status: DC | PRN

## 2020-11-30 MED ORDER — LIDOCAINE 2% (20 MG/ML) 5 ML SYRINGE
INTRAMUSCULAR | Status: AC
  Filled 2020-11-30: qty 5

## 2020-11-30 MED ORDER — CELECOXIB 200 MG PO CAPS
ORAL_CAPSULE | ORAL | Status: AC
  Filled 2020-11-30: qty 2

## 2020-11-30 MED ORDER — ONDANSETRON HCL 4 MG/2ML IJ SOLN: 4.0000 mg | Freq: Four times a day (QID) | INTRAMUSCULAR | Status: DC | PRN

## 2020-11-30 MED ORDER — FENTANYL CITRATE (PF) 100 MCG/2ML IJ SOLN
25.0000 ug | INTRAMUSCULAR | Status: DC | PRN
Start: 2020-11-30 — End: 2020-11-30

## 2020-11-30 MED ORDER — PROPOFOL 10 MG/ML IV BOLUS
INTRAVENOUS | Status: AC
  Filled 2020-11-30: qty 20

## 2020-11-30 MED ORDER — BUPIVACAINE HCL 0.25 % IJ SOLN
INTRAMUSCULAR | Status: DC | PRN
  Administered 2020-11-30: 10 mL

## 2020-11-30 MED ORDER — LACTATED RINGERS IV SOLN: INTRAVENOUS | Status: DC

## 2020-11-30 MED ORDER — CELECOXIB 200 MG PO CAPS
400.0000 mg | ORAL_CAPSULE | ORAL | Status: AC
  Administered 2020-11-30: 400 mg via ORAL

## 2020-11-30 MED ORDER — SCOPOLAMINE 1 MG/3DAYS TD PT72
1.0000 | MEDICATED_PATCH | TRANSDERMAL | Status: DC
  Administered 2020-11-30: 1.5 mg via TRANSDERMAL

## 2020-11-30 MED ORDER — GABAPENTIN 300 MG PO CAPS
ORAL_CAPSULE | ORAL | Status: AC
  Filled 2020-11-30: qty 1

## 2020-11-30 SURGICAL SUPPLY — 46 items
BLADE CLIPPER SENSICLIP SURGIC (BLADE) IMPLANT
BLADE SURG 15 STRL LF DISP TIS (BLADE) ×1 IMPLANT
BLADE SURG 15 STRL SS (BLADE) ×2
CANISTER SUCT 1200ML W/VALVE (MISCELLANEOUS) IMPLANT
CANISTER SUCT 3000ML PPV (MISCELLANEOUS) IMPLANT
CATH ROBINSON RED A/P 14FR (CATHETERS) IMPLANT
CATH ROBINSON RED A/P 16FR (CATHETERS) ×4 IMPLANT
COVER SURGICAL LIGHT HANDLE (MISCELLANEOUS) ×2 IMPLANT
COVER WAND RF STERILE (DRAPES) ×2 IMPLANT
DEPRESSOR TONGUE BLADE STERILE (MISCELLANEOUS) ×2 IMPLANT
DRSG TELFA 3X8 NADH (GAUZE/BANDAGES/DRESSINGS) IMPLANT
ELECT BALL LEEP 3MM BLK (ELECTRODE) IMPLANT
GAUZE 4X4 16PLY RFD (DISPOSABLE) ×2 IMPLANT
GLOVE SURG ENC MOIS LTX SZ6 (GLOVE) ×4 IMPLANT
GLOVE SURG UNDER POLY LF SZ7 (GLOVE) ×4 IMPLANT
GLOVE SURG UNDER POLY LF SZ7.5 (GLOVE) ×6 IMPLANT
GOWN STRL REUS W/ TWL LRG LVL3 (GOWN DISPOSABLE) ×1 IMPLANT
GOWN STRL REUS W/TWL LRG LVL3 (GOWN DISPOSABLE) ×4 IMPLANT
KIT TURNOVER CYSTO (KITS) ×2 IMPLANT
NEEDLE HYPO 25X1 1.5 SAFETY (NEEDLE) ×2 IMPLANT
NS IRRIG 500ML POUR BTL (IV SOLUTION) ×2 IMPLANT
PACK PERINEAL COLD (PAD) ×2 IMPLANT
PACK VAGINAL WOMENS (CUSTOM PROCEDURE TRAY) ×2 IMPLANT
PAD PREP 24X48 CUFFED NSTRL (MISCELLANEOUS) ×2 IMPLANT
PUNCH BIOPSY DERMAL 3 (INSTRUMENTS) ×1 IMPLANT
PUNCH BIOPSY DERMAL 3MM (INSTRUMENTS) ×2
PUNCH BIOPSY DERMAL 4MM (INSTRUMENTS) ×2 IMPLANT
SUT VIC AB 0 CT1 36 (SUTURE) IMPLANT
SUT VIC AB 0 SH 27 (SUTURE) ×2 IMPLANT
SUT VIC AB 2-0 CT2 27 (SUTURE) IMPLANT
SUT VIC AB 2-0 SH 27 (SUTURE)
SUT VIC AB 2-0 SH 27XBRD (SUTURE) IMPLANT
SUT VIC AB 3-0 PS2 18 (SUTURE)
SUT VIC AB 3-0 PS2 18XBRD (SUTURE) IMPLANT
SUT VIC AB 3-0 SH 27 (SUTURE) ×2
SUT VIC AB 3-0 SH 27X BRD (SUTURE) ×1 IMPLANT
SUT VIC AB 4-0 PS2 18 (SUTURE) ×2 IMPLANT
SUT VICRYL 0 UR6 27IN ABS (SUTURE) IMPLANT
SWAB OB GYN 8IN STERILE 2PK (MISCELLANEOUS) ×4 IMPLANT
SYR BULB IRRIG 60ML STRL (SYRINGE) ×2 IMPLANT
TOWEL OR 17X26 10 PK STRL BLUE (TOWEL DISPOSABLE) ×4 IMPLANT
TUBE CONNECTING 12X1/4 (SUCTIONS) IMPLANT
VACUUM HOSE 7/8X10 W/ WAND (MISCELLANEOUS) ×2 IMPLANT
VACUUM HOSE/TUBING 7/8INX6FT (MISCELLANEOUS) ×2 IMPLANT
WATER STERILE IRR 1000ML POUR (IV SOLUTION) ×2 IMPLANT
WATER STERILE IRR 500ML POUR (IV SOLUTION) ×2 IMPLANT

## 2020-11-30 NOTE — Anesthesia Postprocedure Evaluation (Signed)
Anesthesia Post Note  Patient: Danielle Sanford  Procedure(s) Performed: CO2 LASER APPLICATION OF VULVA (N/A Vulva) VULVAR LESION EXCISION (N/A Vulva)     Patient location during evaluation: PACU Anesthesia Type: General Level of consciousness: awake and alert Pain management: pain level controlled Vital Signs Assessment: post-procedure vital signs reviewed and stable Respiratory status: spontaneous breathing, nonlabored ventilation and respiratory function stable Cardiovascular status: blood pressure returned to baseline and stable Postop Assessment: no apparent nausea or vomiting Anesthetic complications: no   No complications documented.  Last Vitals:  Vitals:   11/30/20 1300 11/30/20 1328  BP: 117/62 121/68  Pulse: 72 66  Resp: 15 16  Temp:  (!) 36.4 C  SpO2: 93% 95%    Last Pain:  Vitals:   11/30/20 1328  TempSrc:   PainSc: 0-No pain                 Beryle Lathe

## 2020-11-30 NOTE — Interval H&P Note (Signed)
History and Physical Interval Note:  11/30/2020 11:13 AM  Danielle Sanford  has presented today for surgery, with the diagnosis of VULVAR INTRAEPITHELIAL NEOPLASIA III.  The various methods of treatment have been discussed with the patient and family. After consideration of risks, benefits and other options for treatment, the patient has consented to  Procedure(s): CO2 LASER APPLICATION OF VULVA (N/A) VULVAR LESION EXCISION (N/A) as a surgical intervention.  The patient's history has been reviewed, patient examined, no change in status, stable for surgery.  I have reviewed the patient's chart and labs.  Questions were answered to the patient's satisfaction.     Luisa Dago

## 2020-11-30 NOTE — Discharge Instructions (Addendum)
Vulvar Laser After Care   ACTIVITY  Rest as much as possible the first two days after discharge.  No restrictions on heavy lifting   Do not drive a car for 24 hours  Increase activity gradually.  You may exercise after your laser site has healed. It was contribute to delayed healing if you apply too much friction to the area too quickly.  NUTRITION  You may resume your normal diet.  Drink 6 to 8 glasses of fluids a day.  Eat a healthy, balanced diet including portions of food from the meat (protein), milk, fruit, vegetable, and bread groups.  Your caregiver may recommend you take a multivitamin with iron.  ELIMINATION  You may notice that it burns when you urinate. To minimize this spray water onto your vulva as the urine passes out to dilute the urine. We will provide you with a spray bottle. A regular bottle of tap water can also be used.  Pat the area dry with toilet tissue or towel after voiding urine or stool. Do not wipe.  Re-apply burn cream in a thick layer (or neosporin or diaper cream) after voiding, rinsing and drying.  A hair dryer on the cool setting is also comforting to dry or soothe the area.  If constipation occurs, drink more liquids, and add more fruits, vegetables, and bran to your diet. You may take a mild laxative, such as Milk of Magnesia, Metamucil, or a stool softener such as Colace, with permission from your caregiver. HYGIENE  You may shower and wash your hair.  Avoid tub baths for 4 weeks.  Do not add any bath oils or chemicals to your bath water, after you have permission to take baths.  While passing urine, pour water from a bottle or spray over your vulva to dilute the urine as it passes the incision (this will decrease burning and discomfort).  Clean yourself well after moving your bowels.  After urinating, do not wipe. Dap or pat dry with toilet paper or a dry cleath soft cloth.  A sitz bath will help keep your perineal area clean,  reduce swelling, and provide comfort.  Avoid wearing underpants for the first 2 weeks and wear loose skirts to allow circulation of air around the laser site  Apply silvadine or neosporin or desitin to the wound as it heals.  HOME CARE INSTRUCTIONS   Apply a soft ice pack (or frozen bag of peas) to your perineum (vulva) every hour in the first 48 hours after surgery. This will reduce swelling.  Avoid activities that involve a lot of friction between your legs.  Avoid wearing pants or underpants in the 1st 2 weeks (skirts are preferable).  Take your temperature twice a day and record it, especially if you feel feverish or have chills.  Follow your caregiver's instructions about medicines, activity, and follow-up appointments after surgery.  Do not drink alcohol while taking pain medicine.  You may take over-the-counter medicine for pain, recommended by your caregiver.  If your pain is not relieved with medicine, call your caregiver.  Do not douche or use tampons (use a nonperfumed sanitary pad).  Do not have sexual intercourse until your caregiver gives you permission (typically 6 weeks postoperatively). Hugging, kissing, and playful sexual activity is fine with your caregiver's permission.  Warm sitz baths, with your caregiver's permission, are helpful to control swelling and discomfort.  Take showers instead of baths, until your caregiver gives you permission to take baths.  You may take a mild  medicine for constipation, recommended by your caregiver. Bran foods and drinking a lot of fluids will help with constipation.  Make sure your family understands everything about your operation and recovery. SEEK MEDICAL CARE IF:   You notice swelling and redness around the wound area.  You notice a foul smell coming from the wound or on the surgical dressing.  You notice the wound is separating.  You have painful or bloody urination.  You develop nausea and vomiting.  You  develop diarrhea.  You develop a rash.  You have a reaction or allergy from the medicine.  You feel dizzy or light-headed.  You need stronger pain medicine. SEEK IMMEDIATE MEDICAL CARE IF:   You develop a temperature of 102 F (38.9 C) or higher.  You pass out.  You develop leg or chest pain.  You develop abdominal pain.  You develop shortness of breath.  You develop bleeding from the wound area.  You see pus in the wound area. MAKE SURE YOU:   Understand these instructions.  Will watch your condition.  Will get help right away if you are not doing well or get worse.  Contact Dr Andrey Farmer at # [phone]. After hours this line will connect to the after-hours-nurse line which will contact the doctor on call.  Document Released: 04/17/2004 Document Revised: 01/18/2014 Document Reviewed: 08/05/2009 [facility] Patient Information 2015 [place], [place]. This information is not intended to replace advice given to you by your health care provider. Make sure you discuss any questions you have with your health care provider.    Post Anesthesia Home Care Instructions  Activity: Get plenty of rest for the remainder of the day. A responsible individual must stay with you for 24 hours following the procedure.  For the next 24 hours, DO NOT: -Drive a car -Advertising copywriter -Drink alcoholic beverages -Take any medication unless instructed by your physician -Make any legal decisions or sign important papers.  Meals: Start with liquid foods such as gelatin or soup. Progress to regular foods as tolerated. Avoid greasy, spicy, heavy foods. If nausea and/or vomiting occur, drink only clear liquids until the nausea and/or vomiting subsides. Call your physician if vomiting continues.  Special Instructions/Symptoms: Your throat may feel dry or sore from the anesthesia or the breathing tube placed in your throat during surgery. If this causes discomfort, gargle with warm salt water. The  discomfort should disappear within 24 hours.  If you had a scopolamine patch placed behind your ear for the management of post- operative nausea and/or vomiting:  1. The medication in the patch is effective for 72 hours, after which it should be removed.  Wrap patch in a tissue and discard in the trash. Wash hands thoroughly with soap and water. 2. You may remove the patch earlier than 72 hours if you experience unpleasant side effects which may include dry mouth, dizziness or visual disturbances. 3. Avoid touching the patch. Wash your hands with soap and water after contact with the patch.    Remove patch behind Left ear by Friday, December 02, 2020.  Information for Discharge Teaching: EXPAREL (bupivacaine liposome injectable suspension)   Your surgeon or anesthesiologist gave you EXPAREL(bupivacaine) to help control your pain after surgery.   EXPAREL is a local anesthetic that provides pain relief by numbing the tissue around the surgical site.  EXPAREL is designed to release pain medication over time and can control pain for up to 72 hours.  Depending on how you respond to EXPAREL, you  may require less pain medication during your recovery.  Possible side effects:  Temporary loss of sensation or ability to move in the area where bupivacaine was injected.  Nausea, vomiting, constipation  Rarely, numbness and tingling in your mouth or lips, lightheadedness, or anxiety may occur.  Call your doctor right away if you think you may be experiencing any of these sensations, or if you have other questions regarding possible side effects.  Follow all other discharge instructions given to you by your surgeon or nurse. Eat a healthy diet and drink plenty of water or other fluids.  If you return to the hospital for any reason within 96 hours following the administration of EXPAREL, it is important for health care providers to know that you have received this anesthetic. A teal colored band has  been placed on your arm with the date, time and amount of EXPAREL you have received in order to alert and inform your health care providers. Please leave this armband in place for the full 96 hours following administration, and then you may remove the band.  Do not remove green armband before Saturday, December 03, 2020.

## 2020-11-30 NOTE — Progress Notes (Signed)
Contacted patient's spouse to discuss surgery findings with him. No answer to the call and no message could be left on the phone.  The procedure was uncomplicated. She will be discharged with written instructions and had verbal instructions provided at her preop.  Luisa Dago, MD

## 2020-11-30 NOTE — Anesthesia Procedure Notes (Signed)
Procedure Name: LMA Insertion Date/Time: 11/30/2020 11:45 AM Performed by: Elyn Peers, CRNA Pre-anesthesia Checklist: Patient identified, Emergency Drugs available, Suction available, Patient being monitored and Timeout performed Patient Re-evaluated:Patient Re-evaluated prior to induction Oxygen Delivery Method: Circle system utilized Preoxygenation: Pre-oxygenation with 100% oxygen Induction Type: IV induction Ventilation: Mask ventilation without difficulty LMA: LMA inserted LMA Size: 4.0 Number of attempts: 1 Placement Confirmation: positive ETCO2 and breath sounds checked- equal and bilateral Tube secured with: Tape Dental Injury: Teeth and Oropharynx as per pre-operative assessment

## 2020-11-30 NOTE — Anesthesia Preprocedure Evaluation (Addendum)
Anesthesia Evaluation  Patient identified by MRN, date of birth, ID band Patient awake    Reviewed: Allergy & Precautions, NPO status , Patient's Chart, lab work & pertinent test results, reviewed documented beta blocker date and time   History of Anesthesia Complications Negative for: history of anesthetic complications  Airway Mallampati: III  TM Distance: >3 FB Neck ROM: Full    Dental  (+) Dental Advisory Given, Teeth Intact   Pulmonary neg pulmonary ROS,    Pulmonary exam normal        Cardiovascular hypertension, Pt. on home beta blockers and Pt. on medications Normal cardiovascular exam     Neuro/Psych negative neurological ROS  negative psych ROS   GI/Hepatic negative GI ROS, Neg liver ROS,   Endo/Other  Hypothyroidism  Obesity   Renal/GU negative Renal ROS  Female GU complaint     Musculoskeletal negative musculoskeletal ROS (+)   Abdominal   Peds  Hematology negative hematology ROS (+)   Anesthesia Other Findings Covid+ 07/2020 Sarcoidosis affecting skin Covid test negative   Reproductive/Obstetrics                            Anesthesia Physical Anesthesia Plan  ASA: II  Anesthesia Plan: General   Post-op Pain Management:    Induction: Intravenous  PONV Risk Score and Plan: 3 and Treatment may vary due to age or medical condition, Ondansetron, Dexamethasone and Midazolam  Airway Management Planned: LMA  Additional Equipment: None  Intra-op Plan:   Post-operative Plan: Extubation in OR  Informed Consent: I have reviewed the patients History and Physical, chart, labs and discussed the procedure including the risks, benefits and alternatives for the proposed anesthesia with the patient or authorized representative who has indicated his/her understanding and acceptance.     Dental advisory given  Plan Discussed with: CRNA and Anesthesiologist  Anesthesia  Plan Comments:        Anesthesia Quick Evaluation

## 2020-11-30 NOTE — Op Note (Signed)
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 Acetowhite changes in a broad horseshoe configuration carpetting the skin of the posterior vaginal introitus and labia minora extending to the anterior vulva.   Procedure: The patient was identified in the preoperative holding area. Informed consent was signed on the chart. Patient was seen history was reviewed and exam was performed.   The patient was then taken to the operating room and placed in the supine position with SCD hose on. General anesthesia was then induced without difficulty. She was then placed in the dorsolithotomy position. The perineum was prepped with Betadine. The vagina was prepped with Betadine. The patient was then draped after the prep was dried. A Foley catheter was inserted into the bladder under sterile conditions to drain the bladder then removed.  Timeout was performed the patient, procedure, antibiotic, allergy, and length of procedure. 5% acetic acid solution was applied to the perineum. The vulvar tissues were inspected for areas of acetowhite changes or leukoplakia. The lesion was identified and the marking pen was used to circumscribe the area with appropriate surgical margins. The subcuticular tissues were infiltrated with a mixture of 10cc quarter percent marcaine mixed with 20cc exparel.   The bulky vulvar condylomatous lesions were excised with the bovie and sent for pathology.    The patient's surgical field was draped with wet towels. The staff and patient ensured laser-safe eyewear and masks were fitted. The laser was set to 12 watts continuous (for the non-hair-bearing skin) and 20 watts continuous for the hair baring skin. The laser was tested for accuracy on a tongue depressor.  The laser was applied to the circumscribed area of the vulva that had been previously identified. The tissue was ablated to the desired depth and the eschar was removed with a moistened sponge. When the entire lesion had been ablated the procedure was complete.  Silvadine cream was applied to the laser site.  All instrument, suture, laparotomy, Ray-Tec, and needle counts were correct x2. The patient tolerated the procedure well and was taken recovery room in stable condition. This is Danielle Sanford dictating an operative note on [name].  Quinn Axe, MD

## 2020-11-30 NOTE — Transfer of Care (Signed)
Immediate Anesthesia Transfer of Care Note  Patient: Danielle Sanford  Procedure(s) Performed: CO2 LASER APPLICATION OF VULVA (N/A Vulva) VULVAR LESION EXCISION (N/A Vulva)  Patient Location: PACU  Anesthesia Type:General  Level of Consciousness: awake, drowsy and responds to stimulation  Airway & Oxygen Therapy: Patient Spontanous Breathing and Patient connected to nasal cannula oxygen  Post-op Assessment: Report given to RN and Post -op Vital signs reviewed and stable  Post vital signs: Reviewed and stable  Last Vitals:  Vitals Value Taken Time  BP 115/58 11/30/20 1230  Temp    Pulse 69 11/30/20 1231  Resp 16 11/30/20 1231  SpO2 98 % 11/30/20 1231  Vitals shown include unvalidated device data.  Last Pain:  Vitals:   11/30/20 1102  TempSrc: Oral  PainSc: 0-No pain      Patients Stated Pain Goal: 5 (11/30/20 1102)  Complications: No complications documented.

## 2020-12-01 ENCOUNTER — Telehealth: Payer: Self-pay

## 2020-12-01 ENCOUNTER — Encounter (HOSPITAL_BASED_OUTPATIENT_CLINIC_OR_DEPARTMENT_OTHER): Payer: Self-pay | Admitting: Gynecologic Oncology

## 2020-12-01 LAB — SURGICAL PATHOLOGY

## 2020-12-01 NOTE — Telephone Encounter (Signed)
Ms Longwell states that she is eating,drinking, and urinating well. Passing gas. She had a good BM this morning.  Told her to take 2 senokot-s tonight and begin bid to morrow with 8 oz of water.  If no BM by Sunday morning 12-04-20, she can add a capful of Miralax bid.  Afebrile. Lasered areas draining some serosanguinous fluid. Told her to begin with sitz baths as directed in post op instructions and apply peas even though the area is still numb and she is not in pain. Begin using the ibuprofen 800 mg q 8 hrs with food. These interventions will help decrease swelling and aide in healing. Pt verbalized understanding. Pt aware of post op appointments as well as the office number [phone] and after hours number [phone] to call if she has any questions or concerns

## 2020-12-06 ENCOUNTER — Other Ambulatory Visit: Payer: Self-pay | Admitting: Nurse Practitioner

## 2020-12-06 MED ORDER — LEVOTHYROXINE SODIUM 150 MCG PO TABS: 150.0000 ug | ORAL_TABLET | Freq: Every day | ORAL | 2 refills | Status: DC

## 2020-12-06 NOTE — Telephone Encounter (Signed)
Copied from CRM [id number]. Topic: Quick Communication - Rx Refill/Question >> Dec 06, 2020 11:36 AM Gaetana Michaelis A wrote: Medication: levothyroxine (SYNTHROID) 150 MCG tablet  Has the patient contacted their pharmacy? Yes. The pharmacy notified the patient of their need for the medication and the patient contacted their pcp  Preferred Pharmacy (with phone number or street name): [facility] - [place], [place] - [address]  Phone:  [phone]  Agent: Please be advised that RX refills may take up to 3 business days. We ask that you follow-up with your pharmacy.

## 2020-12-06 NOTE — Telephone Encounter (Signed)
Med[facility]

## 2020-12-07 ENCOUNTER — Telehealth: Payer: Self-pay | Admitting: *Deleted

## 2020-12-07 ENCOUNTER — Telehealth: Payer: Self-pay

## 2020-12-07 DIAGNOSIS — Z9889 Other specified postprocedural states: Secondary | ICD-10-CM

## 2020-12-07 DIAGNOSIS — D071 Carcinoma in situ of vulva: Secondary | ICD-10-CM

## 2020-12-07 MED ORDER — SILVER SULFADIAZINE 1 % EX CREA: 1.0000 "application " | TOPICAL_CREAM | Freq: Two times a day (BID) | CUTANEOUS | 0 refills | Status: DC

## 2020-12-07 NOTE — Telephone Encounter (Signed)
Told ms Barret that the silvadene cream was sent to her pharmacy. She can apply to surgical areas as needed per Melissa Cross,NP. Pt. Verbalized understanding.

## 2020-12-07 NOTE — Telephone Encounter (Signed)
Patient called for a refill on the silvadine cream from her surgery. Patient request refill be called to [facility]

## 2020-12-22 ENCOUNTER — Other Ambulatory Visit: Payer: Self-pay | Admitting: Gynecologic Oncology

## 2020-12-22 DIAGNOSIS — Z9889 Other specified postprocedural states: Secondary | ICD-10-CM

## 2020-12-22 DIAGNOSIS — D071 Carcinoma in situ of vulva: Secondary | ICD-10-CM

## 2020-12-26 ENCOUNTER — Other Ambulatory Visit: Payer: Self-pay

## 2020-12-26 ENCOUNTER — Inpatient Hospital Stay: Payer: BC Managed Care – PPO | Attending: Gynecologic Oncology | Admitting: Gynecologic Oncology

## 2020-12-26 ENCOUNTER — Encounter: Payer: Self-pay | Admitting: Gynecologic Oncology

## 2020-12-26 VITALS — BP 137/65 | HR 73 | Temp 97.8°F | Resp 16 | Ht 66.5 in | Wt 208.3 lb

## 2020-12-26 DIAGNOSIS — A630 Anogenital (venereal) warts: Secondary | ICD-10-CM | POA: Diagnosis not present

## 2020-12-26 DIAGNOSIS — D071 Carcinoma in situ of vulva: Secondary | ICD-10-CM | POA: Diagnosis not present

## 2020-12-26 DIAGNOSIS — L821 Other seborrheic keratosis: Secondary | ICD-10-CM | POA: Insufficient documentation

## 2020-12-26 DIAGNOSIS — I10 Essential (primary) hypertension: Secondary | ICD-10-CM | POA: Insufficient documentation

## 2020-12-26 DIAGNOSIS — E785 Hyperlipidemia, unspecified: Secondary | ICD-10-CM | POA: Diagnosis not present

## 2020-12-26 DIAGNOSIS — E039 Hypothyroidism, unspecified: Secondary | ICD-10-CM | POA: Diagnosis not present

## 2020-12-26 DIAGNOSIS — Z79899 Other long term (current) drug therapy: Secondary | ICD-10-CM | POA: Insufficient documentation

## 2020-12-26 DIAGNOSIS — D869 Sarcoidosis, unspecified: Secondary | ICD-10-CM | POA: Diagnosis not present

## 2020-12-26 DIAGNOSIS — Z8616 Personal history of COVID-19: Secondary | ICD-10-CM | POA: Diagnosis not present

## 2020-12-26 NOTE — Progress Notes (Signed)
Follow-up Note: Gyn-Onc  Consult was requested by Clarita Crane, NP for the evaluation of Danielle Sanford 60 y.o. female  CC:  Chief Complaint  Patient presents with  . VIN 3    Assessment/Plan:  Danielle Sanford  is a 60 y.o.  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  She will return in 6 months for an inspection of the vulva with acetic acid. If negative for gross disease, she will follow-up with her primary gynecologist thereafter.    HPI: Danielle Sanford is a 60 year old P0 who was seen in consultation at the request of Clarita Crane, NP for evaluation of VIN II-III.  The patient reported a several month history of vulvar irritation in the winter 2022.  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  She saw her Clarita Crane her nurse practitioner for an evaluation on November 14, 2020.  Danielle Sanford evaluated the vulva and identified multiple condyloma, and flat warts on the external genitalia of both the right and left inner labia.  She took representative biopsies from both the left and right labia minora.  This was performed on 11/14/2020.  It was resulted as VIN 2-3.  Her medical history is most significant for sarcoidosis for which she takes Plaquenil.  Her surgical history is most significant for a total abdominal hysterectomy without BSO at age 60 for a history of dysplasia of the cervix.  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  Interval Hx:  On 11/30/20 she underwent CO2 laser to the vulva bilaterally and excision of vulvar lesions. Intraoperative findings were  significant for bulky condylomatous lesions in bilateral labial crural folds in the perineum.  Acetowhite changes in a broad horseshoe configuration carpeting the skin of the posterior vaginal introitus and labia minora extending to the anterior vulva. Surgery was uncomplicated.  Final pathology revealed seborrheic keratoses of the condylomatous lesions but no malignancy or dysplasia.  Since surgery she has done well with no specific complaints.   Current Meds:  Outpatient Encounter Medications as of 12/26/2020  Medication Sig  . atorvastatin (LIPITOR) 20 MG tablet TAKE 1 TABLET DAILY (Patient taking differently: Take 20 mg by mouth daily.)  . hydroxychloroquine (PLAQUENIL) 200 MG tablet Take 200 mg by mouth 2 (two) times daily.  Marland Kitchen ibuprofen (ADVIL) 800 MG tablet Take 1 tablet (800 mg total) by mouth every 8 (eight) hours as needed for moderate pain. For AFTER surgery only  . levothyroxine (SYNTHROID) 150 MCG tablet Take 1 tablet (150 mcg total) by mouth daily.  . propranolol (INDERAL) 10 MG tablet Take 1 tablet (10 mg total) by mouth 3 (three) times daily. (Patient taking differently: Take 10 mg by mouth 2 (two) times daily.)  . silver sulfADIAZINE (SILVADENE) 1 % cream APPLY 1 APPLICATION TOPICALLY 2 (TWO) TIMES DAILY. AS DIRECTED  . tacrolimus (PROTOPIC) 0.03 % ointment Apply topically 2 (two) times daily as needed.  . triamcinolone (KENALOG) 0.025 % cream Apply 1 application topically 2 (two) times daily as needed. Apply to areas of dry, cracked skin on hands  . Clobetasol Prop Emollient  Base (CLOBETASOL PROPIONATE E) 0.05 % emollient cream Apply 1 application topically 2 (two) times daily as needed. (Patient not taking: Reported on 12/23/2020)  . lidocaine (XYLOCAINE) 5 % ointment Apply 1 application topically 3 (three) times daily. (Patient not taking: Reported on 12/23/2020)  . senna-docusate (SENOKOT-S) 8.6-50 MG tablet Take 2 tablets by mouth at bedtime. For AFTER surgery, do not take if  having diarrhea (Patient not taking: No sig reported)  . traMADol (ULTRAM) 50 MG tablet Take 1 tablet (50 mg total) by mouth every 6 (six) hours as needed for severe pain. For AFTER surgery only, do not take and drive (Patient not taking: No sig reported)   No facility-administered encounter medications on file as of 12/26/2020.    Allergy: No Known Allergies  Social Hx:   Social History   Socioeconomic History  . Marital status: Married    Spouse name: Not on file  . Number of children: Not on file  . Years of education: Not on file  . Highest education level: Not on file  Occupational History  . Not on file  Tobacco Use  . Smoking status: Never Smoker  . Smokeless tobacco: Never Used  Vaping Use  . Vaping Use: Never used  Substance and Sexual Activity  . Alcohol use: No    Alcohol/week: 0.0 standard drinks  . Drug use: Never  . Sexual activity: Not on file    Comment: hysterectomy  Other Topics Concern  . Not on file  Social History Narrative  . Not on file   Social Determinants of Health   Financial Resource Strain: Not on file  Food Insecurity: Not on file  Transportation Needs: Not on file  Physical Activity: Not on file  Stress: Not on file  Social Connections: Not on file  Intimate Partner Violence: Not on file    Past Surgical Hx:  Past Surgical History:  Procedure Laterality Date  . BREAST EXCISIONAL BIOPSY Left 1999   per pt benign  . CO2 LASER APPLICATION N/A 11/30/2020   Procedure: CO2 LASER APPLICATION OF VULVA;  Surgeon: Adolphus Birchwood, MD;  Location: [facility];  Service: Gynecology;  Laterality: N/A;  . COLONOSCOPY  2014  . 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 VAGINAL HYSTERECTOMY  2000   W/ BILATERAL SALPINGOOPHORECTOMY  . VULVAR LESION REMOVAL N/A 11/30/2020   Procedure: VULVAR LESION EXCISION;  Surgeon: Adolphus Birchwood, MD;  Location: [facility];  Service: Gynecology;  Laterality: N/A;    Past  Medical Hx:  Past Medical History:  Diagnosis Date  . Abnormal Pap smear of vagina   . History of 2019 novel coronavirus disease (COVID-19) 07/21/2020   positive test in epic, per pt mild symptoms that resolved in 2 wks  . History of adenomatous polyp of colon   . History of cervical dysplasia    persistant , s/p TVH w/ BSO in 2000  . History of vaginal dysplasia   . Hyperlipidemia   . Hypertension    followed by pcp  . Hypothyroidism    followed by pcp  . Sarcoidosis of skin    followed by dr t. Roseanne Reno (dermatologist in [place])--- primary, affected areas right upper cutaneous lip, right cheek, nasal ala, arms, and ankles  . SUI (stress urinary incontinence, female)   . Tachycardia   . Unspecified disorder of nose and nasal sinuses    11-25-2020 per pt has nasal stuffiness, irrigates with neti pot  . 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 Wears contact lenses     Past Gynecological History:  See HPI, chronic HPV infection. No LMP recorded (lmp unknown). Patient has had a hysterectomy.  Family Hx:  Family History  Problem Relation Age of Onset  . Pancreatic cancer Mother   . Diabetes Father   . Thyroid disease Father   . Colon cancer Maternal Grandmother   . Cancer Maternal Grandfather        prostate  . Stroke Paternal Grandfather   . Breast cancer Maternal Aunt 80  . Prostate cancer Neg Hx   . Ovarian cancer Neg Hx   . Uterine cancer Neg Hx     Review of Systems:  Constitutional  Feels well,    ENT Normal appearing ears and nares bilaterally Skin/Breast  No rash, sores, jaundice, itching, dryness Cardiovascular  No chest pain, shortness of breath, or edema  Pulmonary  No cough or wheeze.  Gastro Intestinal  No nausea, vomitting, or diarrhoea. No bright red blood per rectum, no abdominal pain, change in bowel movement, or constipation.  Genito Urinary  No frequency, urgency, dysuria, + vulvar irritation  Musculo Skeletal  No myalgia,  arthralgia, joint swelling or pain  Neurologic  No weakness, numbness, change in gait,  Psychology  No depression, anxiety, insomnia.   Vitals:  Blood pressure 137/65, pulse 73, temperature 97.8 F (36.6 C), temperature source Tympanic, resp. rate 16, height 5' 6.5" (1.689 m), weight 208 lb 4.8 oz (94.5 kg), SpO2 100 %.  Physical Exam: WD in NAD Neck  Supple NROM, without any enlargements.  Lymph Node Survey No cervical supraclavicular or inguinal adenopathy Cardiovascular  Well perfused peripheries Lungs  No increased WOB Skin  No rash/lesions/breakdown  Psychiatry  Alert and oriented to person, place, and time  Abdomen  Normoactive bowel sounds, abdomen soft, non-tender and obese without evidence of hernia. Back No CVA tenderness Genito Urinary  Vulva/vagina: There is an area of slight denuded epithelium in the left labia minora posteriorly but otherwise the vulva laser has healed and the excision sites are all completely closed. Rectal  deferred  Extremities  No bilateral cyanosis, clubbing or edema.   Luisa Dago, MD  12/26/2020, 3:23 PM

## 2020-12-26 NOTE — Patient Instructions (Signed)
Dr Andrey Farmer recommends neosporin ointment to the area of the vulva that is still irritated.  Please contact Dr Oliver Hum office (at [phone]) in June or July to request an appointment with her for October, 2022 so that she can inspect the vulva.

## 2020-12-27 ENCOUNTER — Other Ambulatory Visit: Payer: Self-pay

## 2020-12-27 ENCOUNTER — Telehealth: Payer: Self-pay

## 2020-12-27 ENCOUNTER — Other Ambulatory Visit: Payer: Self-pay | Admitting: Nurse Practitioner

## 2020-12-27 MED ORDER — HYDROXYCHLOROQUINE SULFATE 200 MG PO TABS: 200.0000 mg | ORAL_TABLET | Freq: Two times a day (BID) | ORAL | 1 refills | Status: DC

## 2020-12-27 MED ORDER — ATORVASTATIN CALCIUM 20 MG PO TABS: 1.0000 | ORAL_TABLET | Freq: Every day | ORAL | 3 refills | Status: DC

## 2020-12-27 NOTE — Telephone Encounter (Signed)
Pt scheduled 6/7 rx expired

## 2020-12-27 NOTE — Telephone Encounter (Signed)
Pt called requesting a refill of Plaquenil 200 mg tablets, [facility]    Okay erx'd Plaquenil 200 mg #60 1 RF to [facility], pt aware

## 2020-12-27 NOTE — Telephone Encounter (Signed)
Requested medication (s) are due for refill today: expired medication  Requested medication (s) are on the active medication list: yes  Last refill:  12/15/19 #90 3 refills  Future visit scheduled: yes  Notes to clinic:  expired medication. Do you want to renew Rx? Previously prescribed by another provider .     Requested Prescriptions  Pending Prescriptions Disp Refills   atorvastatin (LIPITOR) 20 MG tablet 90 tablet 3    Sig: Take 1 tablet (20 mg total) by mouth daily.      Cardiovascular:  Antilipid - Statins Failed - 12/27/2020 11:26 AM      Failed - LDL in normal range and within 360 days    LDL Chol Calc (NIH)  Date Value Ref Range Status  04/19/2020 93 0 - 99 mg/dL Final          Failed - HDL in normal range and within 360 days    HDL  Date Value Ref Range Status  04/19/2020 33 (L) >39 mg/dL Final          Failed - Triglycerides in normal range and within 360 days    Triglycerides  Date Value Ref Range Status  04/19/2020 203 (H) 0 - 149 mg/dL Final   Triglycerides Piccolo,Waived  Date Value Ref Range Status  05/20/2015 222 (H) <150 mg/dL Final    Comment:                            Normal                   <150                         Borderline High     150 - 199                         High                200 - 499                         Very High                >499           Passed - Total Cholesterol in normal range and within 360 days    Cholesterol, Total  Date Value Ref Range Status  04/19/2020 161 100 - 199 mg/dL Final   Cholesterol Piccolo, Waived  Date Value Ref Range Status  05/20/2015 133 <200 mg/dL Final    Comment:                            Desirable                <200                         Borderline High      200- 239                         High                     >239           Passed - Patient is not pregnant  Passed - Valid encounter within last 12 months    Recent Outpatient Visits           2 months ago  Vaginal lesion   [facility] [place], Dorie Rank, NP   4 months ago Sarcoid   [facility] [place], Mendes T, NP   5 months ago Fever with exposure to COVID-19 virus   [facility], Dorie Rank, NP   5 months ago Vaginal lesion   [facility] [place], Dorie Rank, NP   11 months ago Essential hypertension   [facility] [place], Dorie Rank, NP       Future Appointments             In 1 month [place], Dorie Rank, NP [facility]   In 1 month Willeen Niece, MD [facility]

## 2020-12-27 NOTE — Telephone Encounter (Signed)
Medication atorvastatin (LIPITOR) 20 MG tablet  Has the pt contacted their pharmacy? Yes, but she said [facility] did not volunteer to send in request.  Preferred pharmacy: [facility] - [place], [place] - [address]  Please be advised refills may take up to 3 business days.  We ask that you follow up with your pharmacy.

## 2020-12-29 ENCOUNTER — Other Ambulatory Visit: Payer: Self-pay

## 2020-12-29 MED ORDER — HYDROXYCHLOROQUINE SULFATE 200 MG PO TABS: 200.0000 mg | ORAL_TABLET | Freq: Two times a day (BID) | ORAL | 1 refills | Status: DC

## 2020-12-29 NOTE — Progress Notes (Signed)
Added ICD10 to RX

## 2021-01-02 ENCOUNTER — Telehealth: Payer: Self-pay

## 2021-01-02 NOTE — Telephone Encounter (Signed)
Lvm to r/s 4/19 appt or see about seeing someone else in office

## 2021-01-03 ENCOUNTER — Ambulatory Visit: Payer: BC Managed Care – PPO | Admitting: Nurse Practitioner

## 2021-01-05 ENCOUNTER — Encounter: Payer: Self-pay | Admitting: Nurse Practitioner

## 2021-01-05 ENCOUNTER — Ambulatory Visit: Payer: BC Managed Care – PPO | Admitting: Nurse Practitioner

## 2021-01-05 ENCOUNTER — Ambulatory Visit (INDEPENDENT_AMBULATORY_CARE_PROVIDER_SITE_OTHER): Payer: BC Managed Care – PPO | Admitting: Nurse Practitioner

## 2021-01-05 ENCOUNTER — Other Ambulatory Visit: Payer: Self-pay

## 2021-01-05 VITALS — BP 105/65 | HR 76 | Temp 97.8°F | Wt 207.4 lb

## 2021-01-05 DIAGNOSIS — H6123 Impacted cerumen, bilateral: Secondary | ICD-10-CM

## 2021-01-05 NOTE — Progress Notes (Signed)
Acute Office Visit  Subjective:    Patient ID: Danielle Sanford, female    DOB: 03/04/1961, 60 y.o.   MRN: [id number]  Chief Complaint  Patient presents with  . Ear Fullness    Patient is here due to ear being clogged with wax in her left ear.    HPI Patient is in today for left ear clogged with wax  EAG CLOGGED  Duration: weeks Involved ear(s):left Sensation of feeling clogged/plugged: yes Decreased/muffled hearing:yes Ear pain: no Fever: no Otorrhea: no Hearing loss: muffled Upper respiratory infection symptoms: no Using Q-Tips: yes Status: worse History of cerumenosis: yes Treatments attempted: q- tips  Past Medical History:  Diagnosis Date  . Abnormal Pap smear of vagina   . History of 2019 novel coronavirus disease (COVID-19) 07/21/2020   positive test in epic, per pt mild symptoms that resolved in 2 wks  . History of adenomatous polyp of colon   . History of cervical dysplasia    persistant , s/p TVH w/ BSO in 2000  . History of vaginal dysplasia   . Hyperlipidemia   . Hypertension    followed by pcp  . Hypothyroidism    followed by pcp  . Sarcoidosis of skin    followed by dr t. Roseanne Reno (dermatologist in [place])--- primary, affected areas right upper cutaneous lip, right cheek, nasal ala, arms, and ankles  . SUI (stress urinary incontinence, female)   . Tachycardia   . Unspecified disorder of nose and nasal sinuses    11-25-2020 per pt has nasal stuffiness, irrigates with neti pot  . VIN III (vulvar intraepithelial neoplasia III)   . Wears contact lenses     Past Surgical History:  Procedure Laterality Date  . BREAST EXCISIONAL BIOPSY Left 1999   per pt benign  . CO2 LASER APPLICATION N/A 11/30/2020   Procedure: CO2 LASER APPLICATION OF VULVA;  Surgeon: Adolphus Birchwood, MD;  Location[facility];  Service: Gynecology;  Laterality: N/A;  . COLONOSCOPY  2014  . 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 VAGINAL  HYSTERECTOMY  2000   W/ BILATERAL SALPINGOOPHORECTOMY  . VULVAR LESION REMOVAL N/A 11/30/2020   Procedure: VULVAR LESION EXCISION;  Surgeon: Adolphus Birchwood, MD;  Location: [facility];  Service: Gynecology;  Laterality: N/A;    Family History  Problem Relation Age of Onset  . Pancreatic cancer Mother   . Diabetes Father   . Thyroid disease Father   . Colon cancer Maternal Grandmother   . Cancer Maternal Grandfather        prostate  . Stroke Paternal Grandfather   . Breast cancer Maternal Aunt 80  . Prostate cancer Neg Hx   . Ovarian cancer Neg Hx   . Uterine cancer Neg Hx     Social History   Socioeconomic History  . Marital status: Married    Spouse name: Not on file  . Number of children: Not on file  . Years of education: Not on file  . Highest education level: Not on file  Occupational History  . Not on file  Tobacco Use  . Smoking status: Never Smoker  . Smokeless tobacco: Never Used  Vaping Use  . Vaping Use: Never used  Substance and Sexual Activity  . Alcohol use: No    Alcohol/week: 0.0 standard drinks  . Drug use: Never  . Sexual activity: Not on file    Comment: hysterectomy  Other Topics Concern  . Not on  file  Social History Narrative  . Not on file   Social Determinants of Health   Financial Resource Strain: Not on file  Food Insecurity: Not on file  Transportation Needs: Not on file  Physical Activity: Not on file  Stress: Not on file  Social Connections: Not on file  Intimate Partner Violence: Not on file    Outpatient Medications Prior to Visit  Medication Sig Dispense Refill  . atorvastatin (LIPITOR) 20 MG tablet Take 1 tablet (20 mg total) by mouth daily. 90 tablet 3  . hydroxychloroquine (PLAQUENIL) 200 MG tablet Take 1 tablet (200 mg total) by mouth 2 (two) times daily. 60 tablet 1  . levothyroxine (SYNTHROID) 150 MCG tablet Take 1 tablet (150 mcg total) by mouth daily. 90 tablet 2  . propranolol (INDERAL) 10 MG tablet  Take 1 tablet (10 mg total) by mouth 3 (three) times daily. (Patient taking differently: Take 10 mg by mouth 2 (two) times daily.) 270 tablet 4  . Clobetasol Prop Emollient Base (CLOBETASOL PROPIONATE E) 0.05 % emollient cream Apply 1 application topically 2 (two) times daily as needed. (Patient not taking: No sig reported) 30 g 2  . ibuprofen (ADVIL) 800 MG tablet Take 1 tablet (800 mg total) by mouth every 8 (eight) hours as needed for moderate pain. For AFTER surgery only (Patient not taking: Reported on 01/05/2021) 30 tablet 0  . lidocaine (XYLOCAINE) 5 % ointment Apply 1 application topically 3 (three) times daily. (Patient not taking: No sig reported) 30 g 1  . senna-docusate (SENOKOT-S) 8.6-50 MG tablet Take 2 tablets by mouth at bedtime. For AFTER surgery, do not take if having diarrhea (Patient not taking: No sig reported) 30 tablet 0  . silver sulfADIAZINE (SILVADENE) 1 % cream APPLY 1 APPLICATION TOPICALLY 2 (TWO) TIMES DAILY. AS DIRECTED (Patient not taking: Reported on 01/05/2021) 50 g 0  . tacrolimus (PROTOPIC) 0.03 % ointment Apply topically 2 (two) times daily as needed. (Patient not taking: Reported on 01/05/2021)    . traMADol (ULTRAM) 50 MG tablet Take 1 tablet (50 mg total) by mouth every 6 (six) hours as needed for severe pain. For AFTER surgery only, do not take and drive (Patient not taking: No sig reported) 10 tablet 0  . triamcinolone (KENALOG) 0.025 % cream Apply 1 application topically 2 (two) times daily as needed. Apply to areas of dry, cracked skin on hands (Patient not taking: Reported on 01/05/2021) 30 g 0   No facility-administered medications prior to visit.    No Known Allergies  Review of Systems  Constitutional: Negative for fever.  HENT: Positive for ear pain (left ear feels clogged). Negative for congestion, rhinorrhea and sore throat.   Eyes: Negative.        Objective:    Physical Exam Vitals and nursing note reviewed.  Constitutional:      General: She  is not in acute distress.    Appearance: Normal appearance.  HENT:     Head: Normocephalic.     Right Ear: There is impacted cerumen.     Left Ear: There is impacted cerumen.  Eyes:     Conjunctiva/sclera: Conjunctivae normal.  Neurological:     Mental Status: She is alert and oriented to person, place, and time.  Psychiatric:        Mood and Affect: Mood normal.        Behavior: Behavior normal.        Thought Content: Thought content normal.  Judgment: Judgment normal.     BP 105/65   Pulse 76   Temp 97.8 F (36.6 C) (Oral)   Wt 207 lb 6.4 oz (94.1 kg)   LMP  (LMP Unknown)   SpO2 100%   BMI 32.97 kg/m  Wt Readings from Last 3 Encounters:  01/05/21 207 lb 6.4 oz (94.1 kg)  12/26/20 208 lb 4.8 oz (94.5 kg)  11/30/20 207 lb 9.6 oz (94.2 kg)    Health Maintenance Due  Topic Date Due  . MAMMOGRAM  06/14/2019    There are no preventive care reminders to display for this patient.   Lab Results  Component Value Date   TSH 0.594 08/23/2020   Lab Results  Component Value Date   WBC 4.5 11/28/2020   HGB 12.7 11/28/2020   HCT 41.4 11/28/2020   MCV 83.3 11/28/2020   PLT 196 11/28/2020   Lab Results  Component Value Date   NA 139 11/28/2020   K 4.6 11/28/2020   CO2 26 11/28/2020   GLUCOSE 89 11/28/2020   BUN 11 11/28/2020   CREATININE 0.87 11/28/2020   BILITOT 0.4 04/19/2020   ALKPHOS 113 04/19/2020   AST 17 04/19/2020   ALT 17 04/19/2020   PROT 7.2 04/19/2020   ALBUMIN 4.1 04/19/2020   CALCIUM 9.2 11/28/2020   ANIONGAP 8 11/28/2020   Lab Results  Component Value Date   CHOL 161 04/19/2020   Lab Results  Component Value Date   HDL 33 (L) 04/19/2020   Lab Results  Component Value Date   LDLCALC 93 04/19/2020   Lab Results  Component Value Date   TRIG 203 (H) 04/19/2020   Lab Results  Component Value Date   CHOLHDL 4.9 (H) 04/19/2020   No results found for: HGBA1C     Assessment & Plan:   Problem List Items Addressed This Visit    None   Visit Diagnoses    Bilateral impacted cerumen    -  Primary   Ears irrigated today with success. Discussed avoidance of q-tips. Can use mineral oil, debrox, or diluted hydrogen peroxide. F/U if symptoms don't improve.       No orders of the defined types were placed in this encounter.    Gerre Scull, NP

## 2021-01-05 NOTE — Patient Instructions (Signed)
It was great to see you!  May use cotton ball soaked in mineral oil into ear 10-20 min per week if having recurrent ear wax accumulation.  May use dilute hydrogen peroxide (equal parts this and water) and place a few drops into ear every 2 weeks to help break down wax buildup.  Take care,  Rodman Pickle, NP   Earwax Buildup, Adult The ears produce a substance called earwax that helps keep bacteria out of the ear and protects the skin in the ear canal. Occasionally, earwax can build up in the ear and cause discomfort or hearing loss. What are the causes? This condition is caused by a buildup of earwax. Ear canals are self-cleaning. Ear wax is made in the outer part of the ear canal and generally falls out in small amounts over time. When the self-cleaning mechanism is not working, earwax builds up and can cause decreased hearing and discomfort. Attempting to clean ears with cotton swabs can push the earwax deep into the ear canal and cause decreased hearing and pain. What increases the risk? This condition is more likely to develop in people who:  Clean their ears often with cotton swabs.  Pick at their ears.  Use earplugs or in-ear headphones often, or wear hearing aids. The following factors may also make you more likely to develop this condition:  Being female.  Being of older age.  Naturally producing more earwax.  Having narrow ear canals.  Having earwax that is overly thick or sticky.  Having excess hair in the ear canal.  Having eczema.  Being dehydrated. What are the signs or symptoms? Symptoms of this condition include:  Reduced or muffled hearing.  A feeling of fullness in the ear or feeling that the ear is plugged.  Fluid coming from the ear.  Ear pain or an itchy ear.  Ringing in the ear.  Coughing.  Balance problems.  An obvious piece of earwax that can be seen inside the ear canal. How is this diagnosed? This condition may be diagnosed based  on:  Your symptoms.  Your medical history.  An ear exam. During the exam, your health care provider will look into your ear with an instrument called an otoscope. You may have tests, including a hearing test. How is this treated? This condition may be treated by:  Using ear drops to soften the earwax.  Having the earwax removed by a health care provider. The health care provider may: ? Flush the ear with water. ? Use an instrument that has a loop on the end (curette). ? Use a suction device.  Having surgery to remove the wax buildup. This may be done in severe cases. Follow these instructions at home:  Take over-the-counter and prescription medicines only as told by your health care provider.  Do not put any objects, including cotton swabs, into your ear. You can clean the opening of your ear canal with a washcloth or facial tissue.  Follow instructions from your health care provider about cleaning your ears. Do not overclean your ears.  Drink enough fluid to keep your urine pale yellow. This will help to thin the earwax.  Keep all follow-up visits as told. If earwax builds up in your ears often or if you use hearing aids, consider seeing your health care provider for routine, preventive ear cleanings. Ask your health care provider how often you should schedule your cleanings.  If you have hearing aids, clean them according to instructions from the manufacturer and your  health care provider.   Contact a health care provider if:  You have ear pain.  You develop a fever.  You have pus or other fluid coming from your ear.  You have hearing loss.  You have ringing in your ears that does not go away.  You feel like the room is spinning (vertigo).  Your symptoms do not improve with treatment. Get help right away if:  You have bleeding from the affected ear.  You have severe ear pain. Summary  Earwax can build up in the ear and cause discomfort or hearing loss.  The  most common symptoms of this condition include reduced or muffled hearing, a feeling of fullness in the ear, or feeling that the ear is plugged.  This condition may be diagnosed based on your symptoms, your medical history, and an ear exam.  This condition may be treated by using ear drops to soften the earwax or by having the earwax removed by a health care provider.  Do not put any objects, including cotton swabs, into your ear. You can clean the opening of your ear canal with a washcloth or facial tissue. This information is not intended to replace advice given to you by your health care provider. Make sure you discuss any questions you have with your health care provider. Document Revised: 12/22/2019 Document Reviewed: 12/22/2019 Elsevier Patient Education  2021 [employer].

## 2021-01-17 ENCOUNTER — Ambulatory Visit: Payer: BC Managed Care – PPO | Admitting: Nurse Practitioner

## 2021-01-26 DIAGNOSIS — H11243 Scarring of conjunctiva, bilateral: Secondary | ICD-10-CM | POA: Diagnosis not present

## 2021-01-26 DIAGNOSIS — H02055 Trichiasis without entropian left lower eyelid: Secondary | ICD-10-CM | POA: Diagnosis not present

## 2021-02-08 DIAGNOSIS — H02055 Trichiasis without entropian left lower eyelid: Secondary | ICD-10-CM | POA: Diagnosis not present

## 2021-02-09 ENCOUNTER — Other Ambulatory Visit: Payer: Self-pay

## 2021-02-09 DIAGNOSIS — Z1231 Encounter for screening mammogram for malignant neoplasm of breast: Secondary | ICD-10-CM

## 2021-02-17 ENCOUNTER — Encounter: Payer: Self-pay | Admitting: Nurse Practitioner

## 2021-02-17 DIAGNOSIS — A630 Anogenital (venereal) warts: Secondary | ICD-10-CM | POA: Insufficient documentation

## 2021-02-21 ENCOUNTER — Ambulatory Visit (INDEPENDENT_AMBULATORY_CARE_PROVIDER_SITE_OTHER): Payer: BC Managed Care – PPO | Admitting: Nurse Practitioner

## 2021-02-21 ENCOUNTER — Other Ambulatory Visit: Payer: Self-pay

## 2021-02-21 ENCOUNTER — Encounter: Payer: Self-pay | Admitting: Nurse Practitioner

## 2021-02-21 ENCOUNTER — Ambulatory Visit (INDEPENDENT_AMBULATORY_CARE_PROVIDER_SITE_OTHER): Payer: BC Managed Care – PPO | Admitting: Dermatology

## 2021-02-21 VITALS — BP 124/70 | HR 73 | Temp 98.5°F | Ht 66.5 in | Wt 205.0 lb

## 2021-02-21 DIAGNOSIS — Z6832 Body mass index (BMI) 32.0-32.9, adult: Secondary | ICD-10-CM

## 2021-02-21 DIAGNOSIS — I10 Essential (primary) hypertension: Secondary | ICD-10-CM

## 2021-02-21 DIAGNOSIS — D863 Sarcoidosis of skin: Secondary | ICD-10-CM

## 2021-02-21 DIAGNOSIS — D869 Sarcoidosis, unspecified: Secondary | ICD-10-CM

## 2021-02-21 DIAGNOSIS — E039 Hypothyroidism, unspecified: Secondary | ICD-10-CM

## 2021-02-21 DIAGNOSIS — E6609 Other obesity due to excess calories: Secondary | ICD-10-CM

## 2021-02-21 DIAGNOSIS — D071 Carcinoma in situ of vulva: Secondary | ICD-10-CM

## 2021-02-21 DIAGNOSIS — E782 Mixed hyperlipidemia: Secondary | ICD-10-CM

## 2021-02-21 MED ORDER — CLOBETASOL PROPIONATE 0.05 % EX CREA: 1.0000 "application " | TOPICAL_CREAM | Freq: Two times a day (BID) | CUTANEOUS | 3 refills | Status: DC

## 2021-02-21 MED ORDER — TACROLIMUS 0.1 % EX OINT: TOPICAL_OINTMENT | Freq: Two times a day (BID) | CUTANEOUS | 3 refills | Status: DC | PRN

## 2021-02-21 NOTE — Assessment & Plan Note (Signed)
Followed by dermatology.  Continue regimen as prescribed by them, Plaquenil, sees them this afternoon.  Reviewed recent note.  Labs today: CBC.

## 2021-02-21 NOTE — Assessment & Plan Note (Signed)
Chronic, stable.  Continue current medication regimen and adjust as needed.  Lipid panel today. 

## 2021-02-21 NOTE — Progress Notes (Addendum)
   Follow-Up Visit   Subjective  Danielle Sanford is a 60 y.o. female who presents for the following: 6 month follow up (Patient here today for 6 month follow up on sarcoidosis. She was told to continue using clobetasol as needed to raised active areas. Continue using tacrolimus 0.1 % ointment as needed for areas of face and body. And continue using Plaquenil 200 mg tablet by mouth twice daily. She reports areas feel they are getting better and she has no new concerns.).  Patient had one tear duct removal.  Frequent tearing of eye. Also difficult to breathe through nose.   The following portions of the chart were reviewed this encounter and updated as appropriate:       Objective  Well appearing patient in no apparent distress; mood and affect are within normal limits.  A focused examination was performed including right shoulder upper arm, right elbow, upper lip, right mid cheek, lips, nose, right arm, right ankle, left ankle. Relevant physical exam findings are noted in the Assessment and Plan.  Objective  Right Upper Cutaneous Lip, right cheek, nasal ala, arms, ankles: Tan brown and violaceous patches at right shoulder upper arm, right elbow. Pink papule at upper lip.  Pink scaly patch at right mid cheek with surrounding scarring.  Pink- brown papule at central upper lip and left lower lip. Very narrow nasal alar opening BL.  Pink scaly plaque on right forearm. Pink violaceous patches right medial ankle and left medial ankle. Pt had frequent tearing noted during visit.  Assessment & Plan  Sarcoidosis Right Upper Cutaneous Lip, right cheek, nasal ala, arms, ankles  Chronic with scarring, some improvement skin, but patient is having trouble breathing due to narrowing of nasal ala opening. Recommend referral to [facility]   Cont tacrolimus 0.1% ointment to AAs face/body qd/bid prn.   Cont clobetasol cream to raised, active areas qd/bid prn. Avoid face, groin, axilla.  Pending review of  labs done today, will Cont Plaquenil 200mg  take 1 po BID. Will send to [facility] 90-day supply with 1 Rf. Patient yearly eye exam is scheduled for April 05, 2021 recommend checking tear ducts since frequent tearing.  Addendum: 6/7 /22 labs reviewed and were normal, Plaquenil sent in as above.   Topical steroids (such as triamcinolone, fluocinolone, fluocinonide, mometasone, clobetasol, halobetasol, betamethasone, hydrocortisone) can cause thinning and lightening of the skin if they are used for too long in the same area. Your physician has selected the right strength medicine for your problem and area affected on the body. Please use your medication only as directed by your physician to prevent side effects.     clobetasol cream (TEMOVATE) 0.05 % - Right Upper Cutaneous Lip, right cheek, nasal ala, arms, ankles  tacrolimus (PROTOPIC) 0.1 % ointment - Right Upper Cutaneous Lip, right cheek, nasal ala, arms, ankles  Other Related Procedures Ambulatory referral to ENT  Return in about 6 months (around 08/23/2021) for follow up on sarcoidosis .   I, 14/03/2021, CMA, am acting as scribe for Asher Muir, MD.  Documentation: I have reviewed the above documentation for accuracy and completeness, and I agree with the above.  Willeen Niece MD

## 2021-02-21 NOTE — Patient Instructions (Addendum)
Topical steroids (such as triamcinolone, fluocinolone, fluocinonide, mometasone, clobetasol, halobetasol, betamethasone, hydrocortisone) can cause thinning and lightening of the skin if they are used for too long in the same area. Your physician has selected the right strength medicine for your problem and area affected on the body. Please use your medication only as directed by your physician to prevent side effects.    If you have any questions or concerns for your doctor, please call our main line at [phone] and press option 4 to reach your doctor's medical assistant. If no one answers, please leave a voicemail as directed and we will return your call as soon as possible. Messages left after 4 pm will be answered the following business day.   You may also send us a message via MyChart. We typically respond to MyChart messages within 1-2 business days.  For prescription refills, please ask your pharmacy to contact our office. Our fax number is [phone].  If you have an urgent issue when the clinic is closed that cannot wait until the next business day, you can page your doctor at the number below.    Please note that while we do our best to be available for urgent issues outside of office hours, we are not available 24/7.   If you have an urgent issue and are unable to reach us, you may choose to seek medical care at your doctor's office, retail clinic, urgent care center, or emergency room.  If you have a medical emergency, please immediately call 911 or go to the emergency department.  Pager Numbers  - Dr. Kowalski: [phone]  - Dr. Moye: [phone]  - Dr. Stewart: [phone]  In the event of inclement weather, please call our main line at [phone] for an update on the status of any delays or closures.  Dermatology Medication Tips: Please keep the boxes that topical medications come in in order to help keep track of the instructions about where and how to use  these. Pharmacies typically print the medication instructions only on the boxes and not directly on the medication tubes.   If your medication is too expensive, please contact our office at [phone] option 4 or send us a message through MyChart.   We are unable to tell what your co-pay for medications will be in advance as this is different depending on your insurance coverage. However, we may be able to find a substitute medication at lower cost or fill out paperwork to get insurance to cover a needed medication.   If a prior authorization is required to get your medication covered by your insurance company, please allow us 1-2 business days to complete this process.  Drug prices often vary depending on where the prescription is filled and some pharmacies may offer cheaper prices.  The website www.goodrx.com contains coupons for medications through different pharmacies. The prices here do not account for what the cost may be with help from insurance (it may be cheaper with your insurance), but the website can give you the price if you did not use any insurance.  - You can print the associated coupon and take it with your prescription to the pharmacy.  - You may also stop by our office during regular business hours and pick up a GoodRx coupon card.  - If you need your prescription sent electronically to a different pharmacy, notify our office through [facility] MyChart or by phone at [phone] option 4.  

## 2021-02-21 NOTE — Assessment & Plan Note (Signed)
Chronic, stable.  Continue current medication regimen and adjust as needed.  TSH and Free T4 up to date, monitor closely as run on lower end of normal.  Will recheck levels next visit.

## 2021-02-21 NOTE — Assessment & Plan Note (Signed)
Followed by GYN, continue this collaboration, recent notes reviewed.

## 2021-02-21 NOTE — Patient Instructions (Signed)
Hypothyroidism  Hypothyroidism is when the thyroid gland does not make enough of certain hormones (it is underactive). The thyroid gland is a small gland located in the lower front part of the neck, just in front of the windpipe (trachea). This gland makes hormones that help control how the body uses food for energy (metabolism) as well as how the heart and brain function. These hormones also play a role in keeping your bones strong. When the thyroid is underactive, it produces too little of the hormones thyroxine (T4) and triiodothyronine (T3). What are the causes? This condition may be caused by:  Hashimoto's disease. This is a disease in which the body's disease-fighting system (immune system) attacks the thyroid gland. This is the most common cause.  Viral infections.  Pregnancy.  Certain medicines.  Birth defects.  Past radiation treatments to the head or neck for cancer.  Past treatment with radioactive iodine.  Past exposure to radiation in the environment.  Past surgical removal of part or all of the thyroid.  Problems with a gland in the center of the brain (pituitary gland).  Lack of enough iodine in the diet. What increases the risk? You are more likely to develop this condition if:  You are female.  You have a family history of thyroid conditions.  You use a medicine called lithium.  You take medicines that affect the immune system (immunosuppressants). What are the signs or symptoms? Symptoms of this condition include:  Feeling as though you have no energy (lethargy).  Not being able to tolerate cold.  Weight gain that is not explained by a change in diet or exercise habits.  Lack of appetite.  Dry skin.  Coarse hair.  Menstrual irregularity.  Slowing of thought processes.  Constipation.  Sadness or depression. How is this diagnosed? This condition may be diagnosed based on:  Your symptoms, your medical history, and a physical exam.  Blood  tests. You may also have imaging tests, such as an ultrasound or MRI. How is this treated? This condition is treated with medicine that replaces the thyroid hormones that your body does not make. After you begin treatment, it may take several weeks for symptoms to go away. Follow these instructions at home:  Take over-the-counter and prescription medicines only as told by your health care provider.  If you start taking any new medicines, tell your health care provider.  Keep all follow-up visits as told by your health care provider. This is important. ? As your condition improves, your dosage of thyroid hormone medicine may change. ? You will need to have blood tests regularly so that your health care provider can monitor your condition. Contact a health care provider if:  Your symptoms do not get better with treatment.  You are taking thyroid hormone replacement medicine and you: ? Sweat a lot. ? Have tremors. ? Feel anxious. ? Lose weight rapidly. ? Cannot tolerate heat. ? Have emotional swings. ? Have diarrhea. ? Feel weak. Get help right away if you have:  Chest pain.  An irregular heartbeat.  A rapid heartbeat.  Difficulty breathing. Summary  Hypothyroidism is when the thyroid gland does not make enough of certain hormones (it is underactive).  When the thyroid is underactive, it produces too little of the hormones thyroxine (T4) and triiodothyronine (T3).  The most common cause is Hashimoto's disease, a disease in which the body's disease-fighting system (immune system) attacks the thyroid gland. The condition can also be caused by viral infections, medicine, pregnancy, or   past radiation treatment to the head or neck.  Symptoms may include weight gain, dry skin, constipation, feeling as though you do not have energy, and not being able to tolerate cold.  This condition is treated with medicine to replace the thyroid hormones that your body does not make. This  information is not intended to replace advice given to you by your health care provider. Make sure you discuss any questions you have with your health care provider. Document Revised: 06/03/2020 Document Reviewed: 05/19/2020 Elsevier Patient Education  2021 [employer]  

## 2021-02-21 NOTE — Assessment & Plan Note (Signed)
Chronic, stable with BP below goal.  Recommend she monitor BP at least a few mornings a week at home and document.  DASH diet at home.  Continue current medication regimen and adjust as needed.  Labs today: CMP.  Return in 6 months.

## 2021-02-21 NOTE — Assessment & Plan Note (Signed)
BMI 32.59. Recommended eating smaller high protein, low fat meals more frequently and exercising 30 mins a day 5 times a week with a goal of 10-15lb weight loss in the next 3 months. Patient voiced their understanding and motivation to adhere to these recommendations.

## 2021-02-21 NOTE — Progress Notes (Signed)
BP 124/70   Pulse 73   Temp 98.5 F (36.9 C)   Ht 5' 6.5" (1.689 m)   Wt 205 lb (93 kg)   LMP  (LMP Unknown)   SpO2 94%   BMI 32.59 kg/m    Subjective:    Patient ID: Danielle Sanford, female    DOB: 02-Jan-1961, 60 y.o.   MRN: [id number]  HPI: Danielle Sanford is a 60 y.o. female  Chief Complaint  Patient presents with  . Hypertension  . Hypothyroidism   HYPERTENSION / HYPERLIPIDEMIA Continues on Lipitor for HLD and Propranolol for HTN. Satisfied with current treatment?yes Duration of hypertension:chronic BP monitoring frequency:not checking BP range:none BP medication side effects:no Duration of hyperlipidemia:chronic Cholesterol medication side effects:no Cholesterol supplements: none Medication compliance:good compliance Aspirin:no Recent stressors:no Recurrent headaches:no Visual changes:no Palpitations:no Dyspnea:no Chest pain:no Lower extremity edema:no Dizzy/lightheaded:no The 10-year ASCVD risk score Denman George DC Jr., et al., 2013) is: 5%   Values used to calculate the score:     Age: 34 years     Sex: Female     Is Non-Hispanic African American: No     Diabetic: No     Tobacco smoker: No     Systolic Blood Pressure: 124 mmHg     Is BP treated: Yes     HDL Cholesterol: 33 mg/dL     Total Cholesterol: 161 mg/dL  HYPOTHYROIDISM Continues on Levothyroxine 175 MCG and last TSH December 0.594 and Free T4  1.52. 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 Was being followed by [facility] rheumatology, but has not been seen  since 05/17/2016. Is now being followed by dermatology, last saw 08/18/20 and returns this afternoon. She reports the main issue is on her skin.  Last platelet count 167 in August 2021. Denies easy bruising, blood in stool, nose bleeds, or gum bleeding. 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  She is to return visit with them.    Relevant past medical, surgical, family and social history reviewed and updated as indicated. Interim medical history since our last visit reviewed. Allergies and medications reviewed and updated.  Review of Systems  Constitutional: Negative for activity change, appetite change, diaphoresis, fatigue and fever.  Respiratory: Negative for cough, chest tightness and shortness of breath.   Cardiovascular: Negative for chest pain, palpitations and leg swelling.  Gastrointestinal: Negative.   Neurological: Negative.   Psychiatric/Behavioral: Negative.     Per HPI unless specifically indicated above     Objective:    BP 124/70   Pulse 73   Temp 98.5 F (36.9 C)   Ht 5' 6.5" (1.689 m)   Wt 205 lb (93 kg)   LMP  (LMP Unknown)   SpO2 94%   BMI 32.59 kg/m   Wt Readings from Last 3 Encounters:  02/21/21 205 lb (93 kg)  01/05/21 207 lb 6.4 oz (94.1 kg)  12/26/20 208 lb 4.8 oz (94.5 kg)    Physical Exam Vitals and nursing note reviewed.  Constitutional:      General: She is awake. She is not in acute distress.    Appearance: She is well-developed and well-groomed. She is obese. She is not ill-appearing.  HENT:  Head: Normocephalic.     Right Ear: Hearing normal.     Left Ear: Hearing normal.  Eyes:     General: Lids are normal.        Right eye: No discharge.        Left eye: No discharge.     Conjunctiva/sclera: Conjunctivae normal.     Pupils: Pupils are equal, round, and reactive to light.  Cardiovascular:     Rate and Rhythm: Normal rate and regular rhythm.     Heart  sounds: Normal heart sounds. No murmur heard. No gallop.   Pulmonary:     Effort: Pulmonary effort is normal. No accessory muscle usage or respiratory distress.     Breath sounds: Normal breath sounds.  Abdominal:     General: Bowel sounds are normal.     Palpations: Abdomen is soft.  Musculoskeletal:     Cervical back: Normal range of motion and neck supple.     Right lower leg: No edema.     Left lower leg: No edema.  Skin:    General: Skin is warm and dry.  Neurological:     Mental Status: She is alert and oriented to person, place, and time.  Psychiatric:        Attention and Perception: Attention normal.        Mood and Affect: Mood normal.        Speech: Speech normal.        Behavior: Behavior normal. Behavior is cooperative.        Thought Content: Thought content normal.    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 CONDYLOMA, RIGHT LABIO CURAL, EXCISION: - Seborrheic keratosis with features of condyloma acuminatum.  B. CONDYLOMA, PERINEAL, EXCISION: - Seborrheic keratosis with features of condyloma acuminatum.  C. 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  COMMENT:  Dr. Berneice Heinrich reviewed the case.  GROSS DESCRIPTION:  Specimen A: Received in formalin are 6 irregular tissues covered by pink-gray to light brown granular skin.  The tissues range from 0.3 x 0.3 x 0.1 cm to 1.3 x 0.6 x 0.4 cm.  The 2 largest tissues are inked and bisected.  All specimens are submitted in 1 block.  Specimen B: Received in formalin is a 0.7 x 0.6 x 0.4 cm irregular tissue covered by pink-gray to light brown granular  skin.  The specimen is in ked, bisected and submitted in 1 block.  Specimen C: Received in formalin are 4 irregular tissues covered by pink-gray to light brown granular skin.  The tissues range from 0.2 to 0.5 cm.  In toto 1 block.  SW 11/30/2020    Final Diagnosis performed by Consuello Bossier, MD.   Electronically signed 12/01/2020 Technical component performed at [facility], [address], [place], [place] [address].  Professional component performed at [employer]. [facility], [address], [place], [place] [address].  Immunohistochemistry Technical component (if applicable) was performed at [facility]. [address], [place], [place] [address].   IMMUNOHISTOCHEMISTRY DISCLAIMER (if applicable): Some of these immunohistochemical stains may have been developed and the performance characteristics determine by [facility].  Some may not have been cleared or approved by the U.S. Food and Drug Administration. The FDA has  determined that such clearance or approval is not necessary. This test is used for clinical purposes. It should not be regarded as investigational or for research. This laboratory is certified under the Clinical Laboratory Improvement Amendments of 1988 (CLIA-88) as qualified to perform high complexity clinical laboratory testing.  The controls stained appropriately.       Assessment & Plan:   Problem List Items Addressed This Visit      Cardiovascular and Mediastinum   Hypertension    Chronic, stable with BP below goal.  Recommend she monitor BP at least a few mornings a week at home and document.  DASH diet at home.  Continue current medication regimen and adjust as needed.  Labs today: CMP.  Return in 6 months.       Relevant Orders   Comprehensive metabolic panel   Lipid Panel w/o Chol/HDL Ratio     Endocrine   Hypothyroidism    Chronic, stable.  Continue current medication  regimen and adjust as needed.  TSH and Free T4 up to date, monitor closely as run on lower end of normal.  Will recheck levels next visit.        Genitourinary   VIN III (vulvar intraepithelial neoplasia III)    Followed by GYN, continue this collaboration, recent notes reviewed.        Other   Sarcoid - Primary    Followed by dermatology.  Continue regimen as prescribed by them, Plaquenil, sees them this afternoon.  Reviewed recent note.  Labs today: CBC.      Relevant Orders   CBC with Differential/Platelet   Hyperlipidemia    Chronic, stable.  Continue current medication regimen and adjust as needed.  Lipid panel today.      Obesity    BMI 32.59. Recommended eating smaller high protein, low fat meals more frequently and exercising 30 mins a day 5 times a week with a goal of 10-15lb weight loss in the next 3 months. Patient voiced their understanding and motivation to adhere to these recommendations.           Follow up plan: Return in about 6 months (around 08/23/2021) for HTN/HLD, HYPOTHYROID, SARCOID.

## 2021-02-22 ENCOUNTER — Telehealth: Payer: Self-pay | Admitting: *Deleted

## 2021-02-22 LAB — LIPID PANEL W/O CHOL/HDL RATIO
Cholesterol, Total: 123 mg/dL (ref 100–199)
HDL: 32 mg/dL — ABNORMAL LOW (ref >39)
LDL Chol Calc (NIH): 69 mg/dL (ref 0–99)
Triglycerides: 119 mg/dL (ref 0–149)
VLDL Cholesterol Cal: 22 mg/dL (ref 5–40)

## 2021-02-22 LAB — CBC WITH DIFFERENTIAL/PLATELET
Basophils Absolute: 0 10*3/uL (ref 0.0–0.2)
Basos: 1 %
EOS (ABSOLUTE): 0.1 10*3/uL (ref 0.0–0.4)
Eos: 3 %
Hematocrit: 39.5 % (ref 34.0–46.6)
Hemoglobin: 12.3 g/dL (ref 11.1–15.9)
Immature Grans (Abs): 0 10*3/uL (ref 0.0–0.1)
Immature Granulocytes: 0 %
Lymphocytes Absolute: 0.4 10*3/uL — ABNORMAL LOW (ref 0.7–3.1)
Lymphs: 11 %
MCH: 24.7 pg — ABNORMAL LOW (ref 26.6–33.0)
MCHC: 31.1 g/dL — ABNORMAL LOW (ref 31.5–35.7)
MCV: 80 fL (ref 79–97)
Monocytes Absolute: 0.3 10*3/uL (ref 0.1–0.9)
Monocytes: 9 %
Neutrophils Absolute: 2.9 10*3/uL (ref 1.4–7.0)
Neutrophils: 76 %
Platelets: 198 10*3/uL (ref 150–450)
RBC: 4.97 x10E6/uL (ref 3.77–5.28)
RDW: 14 % (ref 11.7–15.4)
WBC: 3.8 10*3/uL (ref 3.4–10.8)

## 2021-02-22 LAB — COMPREHENSIVE METABOLIC PANEL
ALT: 12 IU/L (ref 0–32)
AST: 18 IU/L (ref 0–40)
Albumin/Globulin Ratio: 1.4 (ref 1.2–2.2)
Albumin: 4 g/dL (ref 3.8–4.9)
Alkaline Phosphatase: 119 IU/L (ref 44–121)
BUN/Creatinine Ratio: 12 (ref 12–28)
BUN: 10 mg/dL (ref 8–27)
Bilirubin Total: 0.5 mg/dL (ref 0.0–1.2)
CO2: 25 mmol/L (ref 20–29)
Calcium: 9.2 mg/dL (ref 8.7–10.3)
Chloride: 103 mmol/L (ref 96–106)
Creatinine, Ser: 0.85 mg/dL (ref 0.57–1.00)
Globulin, Total: 2.8 g/dL (ref 1.5–4.5)
Glucose: 89 mg/dL (ref 65–99)
Potassium: 4.2 mmol/L (ref 3.5–5.2)
Sodium: 140 mmol/L (ref 134–144)
Total Protein: 6.8 g/dL (ref 6.0–8.5)
eGFR: 78 mL/min/{1.73_m2} (ref >59)

## 2021-02-22 MED ORDER — HYDROXYCHLOROQUINE SULFATE 200 MG PO TABS: 200.0000 mg | ORAL_TABLET | Freq: Two times a day (BID) | ORAL | 1 refills | Status: DC

## 2021-02-22 NOTE — Telephone Encounter (Signed)
Returned the patient's call and scheduled a follow up appt for Oct 4th

## 2021-02-22 NOTE — Addendum Note (Signed)
Addended by: Asher Muir A on: 02/22/2021 10:56 AM   Modules accepted: Orders

## 2021-02-22 NOTE — Progress Notes (Signed)
Good morning, please let Janece know her labs have returned: - CBC shows no anemia - Kidney and liver function normal + electrolytes all normal. - Cholesterol levels all at goal Continue all current medications and will see you again in 6 months. Keep being awesome!!  Thank you for allowing me to participate in your care.  I appreciate you. Kindest regards, Artasia Thang

## 2021-04-05 ENCOUNTER — Encounter: Payer: Self-pay | Admitting: Nurse Practitioner

## 2021-04-05 ENCOUNTER — Telehealth: Payer: Self-pay

## 2021-04-05 DIAGNOSIS — Z79899 Other long term (current) drug therapy: Secondary | ICD-10-CM | POA: Diagnosis not present

## 2021-04-05 NOTE — Telephone Encounter (Signed)
Copied from CRM [id number]. Topic: General - Other >> Apr 05, 2021 11:48 AM Jaquita Rector A wrote: Reason for CRM: Patient called in to say that she received a bill for date of service  02/21/21 when she was last seen bill is for $19.46 and patient stated that she paid that amount before leaving the office on said date,   received this bill in the mail for $19.46. Please call patient at Ph# [phone]   Lvm to let pt know she did pay the balance and ins is now charging that amount for her apt.

## 2021-04-13 DIAGNOSIS — H02054 Trichiasis without entropian left upper eyelid: Secondary | ICD-10-CM | POA: Diagnosis not present

## 2021-06-01 ENCOUNTER — Telehealth: Payer: Self-pay

## 2021-06-01 NOTE — Telephone Encounter (Signed)
Received call from Ms. Brossard confirming her appt for 10/4 with Dr. Andrey Farmer. Explained that Dr. Andrey Farmer will be leaving the practice at the end of September. Per Warner Mccreedy, NP patient has been scheduled with Dr. Tamela Oddi on 06/28/21 at 12pm. Patient prefers later appointment. Patient is in agreement of date and time. Instructed to call with any questions or concerns.

## 2021-06-08 DIAGNOSIS — H11243 Scarring of conjunctiva, bilateral: Secondary | ICD-10-CM | POA: Diagnosis not present

## 2021-06-20 ENCOUNTER — Ambulatory Visit: Payer: BC Managed Care – PPO | Admitting: Gynecologic Oncology

## 2021-06-27 NOTE — Progress Notes (Signed)
Follow Up Note: Gyn-Onc  Danielle Sanford 60 y.o. female  CC: She presents for routine surveillance/follow-up  HPI: H/O VIN 2 - 3, s/p laser to the vulva on 11/30/20.  The patient reported a several month history of vulvar irritation in the winter 2022.  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  She saw her Danielle Sanford her nurse practitioner for an evaluation on November 14, 2020.  Danielle Sanford evaluated the vulva and identified multiple condyloma, and flat warts on the external genitalia of both the right and left inner labia.  She took representative biopsies from both the left and right labia minora.  This was performed on 11/14/2020.  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  Interval History:  She denies any new lesions or itching.   Review of Systems Genitourinary:negative for itching  Current Meds:  Outpatient Encounter Medications as of 06/28/2021  Medication Sig   atorvastatin (LIPITOR) 20 MG tablet Take 1 tablet (20 mg total) by mouth daily.   clobetasol cream (TEMOVATE) 0.05 % Apply 1 application topically 2 (two) times daily. Apply to raised, scaly and brown areas. Avoid face, axilla, and folds   hydroxychloroquine (PLAQUENIL) 200 MG tablet Take 1 tablet (200 mg total) by mouth 2 (two) times daily.   ibuprofen (ADVIL) 800 MG tablet Take 1 tablet (800 mg total) by mouth every 8 (eight) hours as needed for moderate pain. For AFTER surgery only (Patient not taking: No sig reported)   levothyroxine (SYNTHROID) 150 MCG tablet Take 1 tablet (150 mcg total) by mouth daily.   propranolol (INDERAL) 10 MG tablet Take 1 tablet (10 mg total) by mouth 3 (three) times daily. (Patient taking differently: Take 10 mg by mouth 2 (two) times daily.)   silver sulfADIAZINE (SILVADENE) 1 % cream APPLY 1 APPLICATION TOPICALLY 2 (TWO) TIMES DAILY.  AS DIRECTED   tacrolimus (PROTOPIC) 0.1 % ointment Apply topically 2 (two) times daily as needed. To affected areas of face and body   triamcinolone (KENALOG) 0.025 % cream Apply 1 application topically 2 (two) times daily as needed. Apply to areas of dry, cracked skin on hands   No facility-administered encounter medications on file as of 06/28/2021.    Allergy: No Known Allergies  Social Hx:   Social History   Socioeconomic History   Marital status: Married    Spouse name: Not on file   Number of children: Not on file   Years of education: Not on file   Highest education level: Not on file  Occupational History   Not on file  Tobacco Use   Smoking status: Never   Smokeless tobacco: Never  Vaping Use   Vaping Use: Never used  Substance and Sexual Activity   Alcohol use: No    Alcohol/week: 0.0 standard drinks   Drug use: Never   Sexual activity: Not on file    Comment: hysterectomy  Other Topics Concern   Not on file  Social History Narrative   Not on file   Social Determinants of Health   Financial Resource Strain: Not on file  Food Insecurity: Not on file  Transportation Needs: Not on file  Physical Activity: Not on file  Stress: Not on file  Social Connections: Not on file  Intimate Partner Violence: Not on file    Past Surgical Hx:  Past Surgical History:  Procedure Laterality Date   BREAST EXCISIONAL BIOPSY Left 1999  per pt benign   CO2 LASER APPLICATION N/A 11/30/2020   Procedure: CO2 LASER APPLICATION OF VULVA;  Surgeon: Adolphus Birchwood, MD;  Location: [facility];  Service: Gynecology;  Laterality: N/A;   COLONOSCOPY  2014   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   VAGINAL HYSTERECTOMY  2000   W/ BILATERAL SALPINGOOPHORECTOMY   VULVAR LESION REMOVAL N/A 11/30/2020   Procedure: VULVAR LESION EXCISION;  Surgeon: Adolphus Birchwood, MD;  Location: [facility];  Service: Gynecology;  Laterality: N/A;    Past  Medical Hx:  Past Medical History:  Diagnosis Date   Abnormal Pap smear of vagina    History of 2019 novel coronavirus disease (COVID-19) 07/21/2020   positive test in epic, per pt mild symptoms that resolved in 2 wks   History of adenomatous polyp of colon    History of cervical dysplasia    persistant , s/p TVH w/ BSO in 2000   History of vaginal dysplasia    Hyperlipidemia    Hypertension    followed by pcp   Hypothyroidism    followed by pcp   Sarcoidosis of skin    followed by dr t. Roseanne Reno (dermatologist in [place])--- primary, affected areas right upper cutaneous lip, right cheek, nasal ala, arms, and ankles   SUI (stress urinary incontinence, female)    Tachycardia    Unspecified disorder of nose and nasal sinuses    11-25-2020 per pt has nasal stuffiness, irrigates with neti pot   VIN III (vulvar intraepithelial neoplasia III)    Wears contact lenses     Family Hx:  Family History  Problem Relation Age of Onset   Pancreatic cancer Mother    Diabetes Father    Thyroid disease Father    Colon cancer Maternal Grandmother    Cancer Maternal Grandfather        prostate   Stroke Paternal Grandfather    Breast cancer Maternal Aunt 80   Prostate cancer Neg Hx    Ovarian cancer Neg Hx    Uterine cancer Neg Hx     Vitals:  BP (!) 142/68 (BP Location: Left Arm, Patient Position: Sitting)   Pulse 80   Temp 97.7 F (36.5 C) (Tympanic)   Resp 16   Ht 5\' 6"  (1.676 m)   Wt 199 lb 12.8 oz (90.6 kg)   LMP  (LMP Unknown)   SpO2 100%   BMI 32.25 kg/m   Physical Exam:  Physical Exam HENT:     Right Ear: External ear normal.     Left Ear: External ear normal.  Cardiovascular:     Pulses: Normal pulses.  Pulmonary:     Effort: Pulmonary effort is normal.  Abdominal:     Palpations: Abdomen is soft.     Tenderness: There is no abdominal tenderness. There is no right CVA tenderness or left CVA tenderness.  Genitourinary:    Comments: Bilateral hyperpigmented  patches, left-sided seborrheic keratosis.  No ACW areas. Musculoskeletal:     Cervical back: Neck supple.  Skin:    Findings: No rash.  Neurological:     Mental Status: She is alert and oriented to person, place, and time.     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  No symptoms and/or examination findings suggestive of gross disease   > gynecologic examination (including visual inspection of the vulva) every six months for five years and then annually.  Appropriate to be followed by a generalist gynecologist. Further evaluation with colposcopy and biopsies is warranted if the patient exhibits symptoms and/or examination findings concerning for additional disease   >return prn    Antionette Char, MD 06/27/2021, 11:00 AM

## 2021-06-27 NOTE — Assessment & Plan Note (Addendum)
H/O VIN 2 - 3 s/p vulva laser.  No symptoms and/or examination findings suggestive of gross disease   > gynecologic examination (including visual inspection of the vulva) every six months for five years and then annually.  Appropriate to be followed by a generalist gynecologist. Further evaluation with colposcopy and biopsies is warranted if the patient exhibits symptoms and/or examination findings concerning for additional disease   >return prn

## 2021-06-28 ENCOUNTER — Other Ambulatory Visit: Payer: Self-pay

## 2021-06-28 ENCOUNTER — Encounter: Payer: Self-pay | Admitting: Obstetrics & Gynecology

## 2021-06-28 ENCOUNTER — Inpatient Hospital Stay: Payer: BC Managed Care – PPO | Attending: Gynecologic Oncology | Admitting: Obstetrics & Gynecology

## 2021-06-28 DIAGNOSIS — A630 Anogenital (venereal) warts: Secondary | ICD-10-CM | POA: Insufficient documentation

## 2021-06-28 DIAGNOSIS — N901 Moderate vulvar dysplasia: Secondary | ICD-10-CM | POA: Insufficient documentation

## 2021-06-28 DIAGNOSIS — Z9889 Other specified postprocedural states: Secondary | ICD-10-CM | POA: Insufficient documentation

## 2021-06-28 DIAGNOSIS — D071 Carcinoma in situ of vulva: Secondary | ICD-10-CM

## 2021-06-28 NOTE — Patient Instructions (Signed)
Needs a follow-up visit in 6 months.  She can follow-up with her gynecologist if she chooses.

## 2021-06-29 DIAGNOSIS — H11243 Scarring of conjunctiva, bilateral: Secondary | ICD-10-CM | POA: Diagnosis not present

## 2021-06-29 DIAGNOSIS — H02055 Trichiasis without entropian left lower eyelid: Secondary | ICD-10-CM | POA: Diagnosis not present

## 2021-08-21 ENCOUNTER — Ambulatory Visit (INDEPENDENT_AMBULATORY_CARE_PROVIDER_SITE_OTHER): Payer: BC Managed Care – PPO | Admitting: Dermatology

## 2021-08-21 ENCOUNTER — Other Ambulatory Visit: Payer: Self-pay

## 2021-08-21 DIAGNOSIS — D869 Sarcoidosis, unspecified: Secondary | ICD-10-CM | POA: Diagnosis not present

## 2021-08-21 DIAGNOSIS — L905 Scar conditions and fibrosis of skin: Secondary | ICD-10-CM

## 2021-08-21 MED ORDER — TACROLIMUS 0.1 % EX OINT: TOPICAL_OINTMENT | Freq: Two times a day (BID) | CUTANEOUS | 5 refills | Status: DC | PRN

## 2021-08-21 NOTE — Progress Notes (Signed)
   Follow-Up Visit   Subjective  Danielle Sanford is a 60 y.o. female who presents for the following: Sarcoidosis (Face, arms, ankles. Patient stable on Plaquenil 200mg  BID and Clobetasol cream prn. She hasn't been able to use tacrolimus ointment in years, because insurance isn't covering. ).  But she has some protopic ointment but hasn't used it recently.   The following portions of the chart were reviewed this encounter and updated as appropriate:       Review of Systems:  No other skin or systemic complaints except as noted in HPI or Assessment and Plan.  Objective  Well appearing patient in no apparent distress; mood and affect are within normal limits.  A focused examination was performed including face, arms, ankles. Relevant physical exam findings are noted in the Assessment and Plan.  right upper cutaneous lip, right cheek, nasal ala, ankles, arms Tan, white, and violaceous patches and plaques of the right upper arm, left elbow, right medial ankle, right cheek, eyebrow, left lower lip, bil nasal ala;  narrowing of nasal opening on the right.   Assessment & Plan  Sarcoidosis right upper cutaneous lip, right cheek, nasal ala, ankles, arms  Chronic with scarring, patient is having trouble breathing through nose due to narrowing of nasal ala opening. Referral to [facility]NT sent at last visit 02/2021. Recommend patient call to have consult appt scheduled.  Pt not interested in proceeding at this time.   Patient has appt with PCP tomrrow and will have labs done. Pending labs, continue Plaquenil 200mg  take 1 po BID. Send to [facility] 90 day supply with 1 Rf. Continue yearly eye exams, last exam 03/2021 at [facility] was normal.  Restart tacrolimus 0.1% ointment Apply qd/bid AA face/body dsp 60g 3Rf.  Will resubmit for insurance, discussed GoodRX option if not covered.  Continue Clobetasol Cream - spot treat AA body qd/bid prn. Avoid face, groin, axilla. Pt has refills.    Topical steroids (such as triamcinolone, fluocinolone, fluocinonide, mometasone, clobetasol, halobetasol, betamethasone, hydrocortisone) can cause thinning and lightening of the skin if they are used for too long in the same area. Your physician has selected the right strength medicine for your problem and area affected on the body. Please use your medication only as directed by your physician to prevent side effects.   Hydroxychloroquine (plaquenil) can rarely cause irreversible damage to the retina of the eye if used for a long period of time. This damage can be identified and the medication stopped before vision changes are noticed by going for a yearly ophthalmology exam. Rarely, this medicine can cause low blood counts, liver inflammation, and/or a color change of the skin.  The use of Plaquenil requires long term medication management, including periodic office visits and monitoring of blood work.   .  Related Medications clobetasol cream (TEMOVATE) 0.05 % Apply 1 application topically 2 (two) times daily. Apply to raised, scaly and brown areas. Avoid face, axilla, and folds  tacrolimus (PROTOPIC) 0.1 % ointment Apply topically 2 (two) times daily as needed. To affected areas of face and body  Return in about 6 months (around 02/19/2022) for sarcoidosis.  [facility], CMA, am acting as scribe for 04/21/2022, MD . Documentation: I have reviewed the above documentation for accuracy and completeness, and I agree with the above.  Cherlyn Labella MD

## 2021-08-21 NOTE — Patient Instructions (Addendum)
Hydroxychloroquine (plaquenil) can rarely cause irreversible damage to the retina of the eye if used for a long period of time. This damage can be identified and the medication stopped before vision changes are noticed by going for a yearly ophthalmology exam. Rarely, this medicine can cause low blood counts, liver inflammation, and/or a color change of the skin.  The use of Plaquenil requires long term medication management, including periodic office visits and monitoring of blood work.  Topical steroids (such as triamcinolone, fluocinolone, fluocinonide, mometasone, clobetasol, halobetasol, betamethasone, hydrocortisone) can cause thinning and lightening of the skin if they are used for too long in the same area. Your physician has selected the right strength medicine for your problem and area affected on the body. Please use your medication only as directed by your physician to prevent side effects.    If You Need Anything After Your Visit  If you have any questions or concerns for your doctor, please call our main line at [phone] and press option 4 to reach your doctor's medical assistant. If no one answers, please leave a voicemail as directed and we will return your call as soon as possible. Messages left after 4 pm will be answered the following business day.   You may also send [place] a message via MyChart. We typically respond to MyChart messages within 1-2 business days.  For prescription refills, please ask your pharmacy to contact our office. Our fax number is [phone].  If you have an urgent issue when the clinic is closed that cannot wait until the next business day, you can page your doctor at the number below.    Please note that while we do our best to be available for urgent issues outside of office hours, we are not available 24/7.   If you have an urgent issue and are unable to reach [place], you may choose to seek medical care at your doctor's office, retail clinic, urgent care  center, or emergency room.  If you have a medical emergency, please immediately call 911 or go to the emergency department.  Pager Numbers  - Dr. Gwen Pounds: [phone]  - Dr. Neale Burly: [phone]  - Dr. Roseanne Reno: [phone]  In the event of inclement weather, please call our main line at [phone] for an update on the status of any delays or closures.  Dermatology Medication Tips: Please keep the boxes that topical medications come in in order to help keep track of the instructions about where and how to use these. Pharmacies typically print the medication instructions only on the boxes and not directly on the medication tubes.   If your medication is too expensive, please contact our office at [phone] option 4 or send [place] a message through MyChart.   We are unable to tell what your co-pay for medications will be in advance as this is different depending on your insurance coverage. However, we may be able to find a substitute medication at lower cost or fill out paperwork to get insurance to cover a needed medication.   If a prior authorization is required to get your medication covered by your insurance company, please allow [place] 1-2 business days to complete this process.  Drug prices often vary depending on where the prescription is filled and some pharmacies may offer cheaper prices.  The website www.goodrx.com contains coupons for medications through different pharmacies. The prices here do not account for what the cost may be with help from insurance (it may be cheaper with your insurance), but the website  can give you the price if you did not use any insurance.  - You can print the associated coupon and take it with your prescription to the pharmacy.  - You may also stop by our office during regular business hours and pick up a GoodRx coupon card.  - If you need your prescription sent electronically to a different pharmacy, notify our office through [facility] or by  phone at [phone] option 4.     Si Usted Necesita Algo Despus de Su Visita  Tambin puede enviarnos un mensaje a travs de Clinical cytogeneticist. Por lo general respondemos a los mensajes de MyChart en el transcurso de 1 a 2 das hbiles.  Para renovar recetas, por favor pida a su farmacia que se ponga en contacto con nuestra oficina. Annie Sable de fax es [place] [phone].  Si tiene un asunto urgente cuando la clnica est cerrada y que no puede esperar hasta el siguiente da hbil, puede llamar/localizar a su doctor(a) al nmero que aparece a continuacin.   Por favor, tenga en cuenta que aunque hacemos todo lo posible para estar disponibles para asuntos urgentes fuera del horario de [place], no estamos disponibles las 24 horas del da, los 7 [address]  [address] de la [place].   Si tiene un problema urgente y no puede comunicarse con nosotros, puede optar por buscar atencin mdica  en el consultorio de su doctor(a), en una clnica privada, en un centro de atencin urgente o en una sala de emergencias.  Si tiene Engineer, drilling, por favor llame inmediatamente al 911 o vaya a la sala de emergencias.  Nmeros de bper  - Dr. Gwen Pounds: [phone]  - Dra. Moye: [phone]  - Dra. Roseanne Reno: [phone]  En caso de inclemencias del [place], por favor llame a Lacy Duverney principal al [phone] para una actualizacin sobre el [place] de cualquier retraso o cierre.  Consejos para la medicacin en dermatologa: Por favor, guarde las cajas en las que vienen los medicamentos de uso tpico para ayudarle a seguir las instrucciones sobre dnde y cmo usarlos. Las farmacias generalmente imprimen las instrucciones del medicamento slo en las cajas y no directamente en los tubos del [place].   Si su medicamento es muy caro, por favor, pngase en contacto con Rolm Gala llamando al [phone] y presione la opcin 4 o envenos un mensaje a travs de Clinical cytogeneticist.   No podemos decirle cul ser su copago  por los medicamentos por adelantado ya que esto es diferente dependiendo de la cobertura de su seguro. Sin embargo, es posible que podamos encontrar un medicamento sustituto a Audiological scientist un formulario para que el seguro cubra el medicamento que se considera necesario.   Si se requiere una autorizacin previa para que su compaa de seguros [place] su medicamento, por favor permtanos de 1 a 2 das hbiles para completar [address]  Los precios de los medicamentos varan con frecuencia dependiendo del Environmental consultant de dnde se surte la receta y alguna farmacias pueden ofrecer precios ms baratos.  El sitio web www.goodrx.com tiene cupones para medicamentos de Health and safety inspector. Los precios aqu no tienen en cuenta lo que podra costar con la ayuda del seguro (puede ser ms barato con su seguro), pero el sitio web puede darle el precio si no utiliz Tourist information centre manager.  - Puede imprimir el cupn correspondiente y llevarlo con su receta a la farmacia.  - Tambin puede pasar por nuestra oficina durante el horario de atencin regular y Education officer, museum una tarjeta de cupones de GoodRx.  -  Si necesita que su receta se enve electrnicamente a una farmacia diferente, informe a nuestra oficina a travs de MyChart de [place] o por telfono llamando al [phone] y presione la opcin 4.  

## 2021-08-22 ENCOUNTER — Other Ambulatory Visit: Payer: Self-pay

## 2021-08-22 ENCOUNTER — Encounter: Payer: Self-pay | Admitting: Nurse Practitioner

## 2021-08-22 ENCOUNTER — Ambulatory Visit (INDEPENDENT_AMBULATORY_CARE_PROVIDER_SITE_OTHER): Payer: BC Managed Care – PPO | Admitting: Nurse Practitioner

## 2021-08-22 VITALS — BP 114/70 | HR 72 | Temp 98.4°F | Wt 200.4 lb

## 2021-08-22 DIAGNOSIS — D869 Sarcoidosis, unspecified: Secondary | ICD-10-CM | POA: Diagnosis not present

## 2021-08-22 DIAGNOSIS — D071 Carcinoma in situ of vulva: Secondary | ICD-10-CM

## 2021-08-22 DIAGNOSIS — I10 Essential (primary) hypertension: Secondary | ICD-10-CM

## 2021-08-22 DIAGNOSIS — E6609 Other obesity due to excess calories: Secondary | ICD-10-CM

## 2021-08-22 DIAGNOSIS — Z23 Encounter for immunization: Secondary | ICD-10-CM

## 2021-08-22 DIAGNOSIS — E782 Mixed hyperlipidemia: Secondary | ICD-10-CM | POA: Diagnosis not present

## 2021-08-22 DIAGNOSIS — E039 Hypothyroidism, unspecified: Secondary | ICD-10-CM | POA: Diagnosis not present

## 2021-08-22 DIAGNOSIS — Z6832 Body mass index (BMI) 32.0-32.9, adult: Secondary | ICD-10-CM

## 2021-08-22 LAB — MICROALBUMIN, URINE WAIVED
Creatinine, Urine Waived: 50 mg/dL (ref 10–300)
Microalb, Ur Waived: 10 mg/L (ref 0–19)

## 2021-08-22 MED ORDER — ATORVASTATIN CALCIUM 20 MG PO TABS: 20.0000 mg | ORAL_TABLET | Freq: Every day | ORAL | 4 refills | Status: DC

## 2021-08-22 MED ORDER — PROPRANOLOL HCL 10 MG PO TABS: 10.0000 mg | ORAL_TABLET | Freq: Two times a day (BID) | ORAL | 4 refills | Status: DC

## 2021-08-22 NOTE — Patient Instructions (Signed)
Please call to schedule your mammogram and/or bone density: °[facility]  °Address: [address], [place], [place] [address]  °Phone: [phone]  ° °Mammogram °A mammogram is an X-ray of the breasts. This is done to check for changes that are not normal. This test can look for changes that may be caused by breast cancer or other problems. °Mammograms are regularly done on women beginning at age 61. A man may have a mammogram if he has a lump or swelling in his breast. °Tell a doctor: °About any allergies you have. °If you have breast implants. °If you have had breast disease, biopsy, or surgery. °If you have a family history of breast cancer. °If you are breastfeeding. °Whether you are pregnant or may be pregnant. °What are the risks? °Generally, this is a safe procedure. But problems may occur, including: °Being exposed to radiation. Radiation levels are very low with this test. °The need for more tests. °The results were not read properly. °Trouble finding breast cancer in women with dense breasts. °What happens before the test? °Have this test done about 1-2 weeks after your menstrual period. This is often when your breasts are the least tender. °If you are visiting a new doctor or clinic, have any past mammogram images sent to your new doctor's office. °Wash your breasts and under your arms on the day of the test. °Do not use deodorants, perfumes, lotions, or powders on the day of the test. °Take off any jewelry from your neck. °Wear clothes that you can change into and out of easily. °What happens during the test? ° °You will take off your clothes from the waist up. You will put on a gown. °You will stand in front of the X-ray machine. °Each breast will be placed between two plastic or glass plates. The plates will press down on your breast for a few seconds. Try to relax. This does not cause any harm to your breasts. It may not feel comfortable, but it will be very  brief. °X-rays will be taken from different angles of each breast. °The procedure may vary among doctors and hospitals. °What can I expect after the test? °The mammogram will be read by a specialist (radiologist). °You may need to do parts of the test again. This depends on the quality of the images. °You may go back to your normal activities. °It is up to you to get the results of your test. Ask how to get your results when they are ready. °Summary °A mammogram is an X-ray of the breasts. It looks for changes that may be caused by breast cancer or other problems. °A man may have this test if he has a lump or swelling in his breast. °Before the test, tell your doctor about any breast problems that you have had in the past. °Have this test done about 1-2 weeks after your menstrual period. °Ask when your test results will be ready. Make sure you get your test results. °This information is not intended to replace advice given to you by your health care provider. Make sure you discuss any questions you have with your health care provider. °Document Revised: 05/17/2021 Document Reviewed: 07/04/2020 °Elsevier Patient Education © 2022 Elsevier Inc. ° °

## 2021-08-22 NOTE — Assessment & Plan Note (Signed)
Chronic, stable with BP below goal.  Recommend she monitor BP at least a few mornings a week at home and document.  DASH diet at home.  Continue current medication regimen and adjust as needed.  Labs today: CMP, CBC, urine ALB, TSH.  Return in 6 months.

## 2021-08-22 NOTE — Assessment & Plan Note (Signed)
Chronic, stable.  Continue current medication regimen and adjust as needed.  Lipid panel today. 

## 2021-08-22 NOTE — Assessment & Plan Note (Signed)
Continue collaboration with GYN. 

## 2021-08-22 NOTE — Assessment & Plan Note (Signed)
BMI 32.35.  Recommended eating smaller high protein, low fat meals more frequently and exercising 30 mins a day 5 times a week with a goal of 10-15lb weight loss in the next 3 months. Patient voiced their understanding and motivation to adhere to these recommendations.  

## 2021-08-22 NOTE — Assessment & Plan Note (Signed)
Followed by dermatology.  Continue regimen as prescribed by them, Plaquenil, sees them this afternoon.  Reviewed recent note.  Labs today: CBC and CMP.

## 2021-08-22 NOTE — Progress Notes (Signed)
BP 114/70   Pulse 72   Temp 98.4 F (36.9 C)   Wt 200 lb 6.4 oz (90.9 kg)   LMP  (LMP Unknown)   SpO2 100%   BMI 32.35 kg/m    Subjective:    Patient ID: Danielle Sanford, female    DOB: 1961/06/30, 60 y.o.   MRN: [id number]  HPI: Danielle Sanford is a 60 y.o. female  Chief Complaint  Patient presents with   Hypothyroidism   Hyperlipidemia   Hypertension   Sarcoidosis   HYPERTENSION / HYPERLIPIDEMIA Continues on Lipitor for HLD and Propranolol for HTN. Satisfied with current treatment? yes Duration of hypertension: chronic BP monitoring frequency: not checking BP range: none BP medication side effects: no Duration of hyperlipidemia: chronic Cholesterol medication side effects: no Cholesterol supplements: none Medication compliance: good compliance Aspirin: no Recent stressors: no Recurrent headaches: no Visual changes: no Palpitations: no Dyspnea: no Chest pain: no Lower extremity edema: no Dizzy/lightheaded: no  The ASCVD Risk score (Arnett DK, et al., 2019) failed to calculate for the following reasons:   The valid total cholesterol range is 130 to 320 mg/dL  HYPOTHYROIDISM Continues on Levothyroxine 150 MCG and last TSH December 0.594. Thyroid control status:stable Satisfied with current treatment? 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  Was being followed by [facility] rheumatology, but has not been seen since 05/17/2016.  Is now being followed by dermatology, last saw 08/21/21.  Last platelet count 198.  Denies easy bruising, blood in stool, nose bleeds, or gum bleeding.  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 Interim medical history since our last visit reviewed. Allergies and medications reviewed and updated.  Review of Systems  Constitutional:  Negative for activity change, appetite change, diaphoresis, fatigue and fever.  Respiratory:  Negative for cough, chest tightness and shortness of breath.   Cardiovascular:  Negative for chest pain, palpitations and leg swelling.  Gastrointestinal: Negative.   Neurological: Negative.   Psychiatric/Behavioral: Negative.     Per HPI unless specifically indicated above     Objective:    BP 114/70   Pulse 72   Temp 98.4 F (36.9 C)   Wt 200 lb 6.4 oz (90.9 kg)   LMP  (LMP Unknown)   SpO2 100%   BMI 32.35 kg/m   Wt Readings from Last 3 Encounters:  08/22/21 200 lb 6.4 oz (90.9 kg)  06/28/21 199 lb 12.8 oz (90.6 kg)  02/21/21 205 lb (93 kg)    Physical Exam Vitals and nursing note reviewed.  Constitutional:      General: She is awake. She is not in acute distress.    Appearance: She is well-developed and well-groomed. She is obese. She is not ill-appearing.  HENT:     Head: Normocephalic.     Right Ear: Hearing normal.     Left Ear: Hearing normal.  Eyes:     General: Lids are normal.        Right eye: No discharge.        Left eye: No discharge.  Conjunctiva/sclera: Conjunctivae normal.     Pupils: Pupils are equal, round, and reactive to light.  Cardiovascular:     Rate and Rhythm: Normal rate and regular rhythm.     Heart sounds: Normal heart sounds. No murmur heard.   No gallop.  Pulmonary:     Effort: Pulmonary effort is normal. No accessory muscle usage or respiratory distress.     Breath sounds: Normal breath sounds.  Abdominal:     General: Bowel sounds are normal.     Palpations: Abdomen is soft.  Musculoskeletal:     Cervical back: Normal  range of motion and neck supple.     Right lower leg: No edema.     Left lower leg: No edema.  Skin:    General: Skin is warm and dry.  Neurological:     Mental Status: She is alert and oriented to person, place, and time.  Psychiatric:        Attention and Perception: Attention normal.        Mood and Affect: Mood normal.        Speech: Speech normal.        Behavior: Behavior normal. Behavior is cooperative.        Thought Content: Thought content normal.   Results for orders placed or performed in visit on 02/21/21  CBC with Differential/Platelet  Result Value Ref Range   WBC 3.8 3.4 - 10.8 x10E3/uL   RBC 4.97 3.77 - 5.28 x10E6/uL   Hemoglobin 12.3 11.1 - 15.9 g/dL   Hematocrit 39.5 34.0 - 46.6 %   MCV 80 79 - 97 fL   MCH 24.7 (L) 26.6 - 33.0 pg   MCHC 31.1 (L) 31.5 - 35.7 g/dL   RDW 14.0 11.7 - 15.4 %   Platelets 198 150 - 450 x10E3/uL   Neutrophils 76 Not Estab. %   Lymphs 11 Not Estab. %   Monocytes 9 Not Estab. %   Eos 3 Not Estab. %   Basos 1 Not Estab. %   Neutrophils Absolute 2.9 1.4 - 7.0 x10E3/uL   Lymphocytes Absolute 0.4 (L) 0.7 - 3.1 x10E3/uL   Monocytes Absolute 0.3 0.1 - 0.9 x10E3/uL   EOS (ABSOLUTE) 0.1 0.0 - 0.4 x10E3/uL   Basophils Absolute 0.0 0.0 - 0.2 x10E3/uL   Immature Granulocytes 0 Not Estab. %   Immature Grans (Abs) 0.0 0.0 - 0.1 x10E3/uL  Comprehensive metabolic panel  Result Value Ref Range   Glucose 89 65 - 99 mg/dL   BUN 10 8 - 27 mg/dL   Creatinine, Ser 0.85 0.57 - 1.00 mg/dL   eGFR 78 >59 mL/min/1.73   BUN/Creatinine Ratio 12 12 - 28   Sodium 140 134 - 144 mmol/L   Potassium 4.2 3.5 - 5.2 mmol/L   Chloride 103 96 - 106 mmol/L   CO2 25 20 - 29 mmol/L   Calcium 9.2 8.7 - 10.3 mg/dL   Total Protein 6.8 6.0 - 8.5 g/dL   Albumin 4.0 3.8 - 4.9 g/dL   Globulin, Total 2.8 1.5 - 4.5 g/dL   Albumin/Globulin Ratio 1.4 1.2 - 2.2   Bilirubin Total 0.5 0.0 - 1.2 mg/dL   Alkaline Phosphatase 119 44 - 121 IU/L   AST 18 0 - 40 IU/L   ALT 12 0  - 32 IU/L  Lipid Panel w/o Chol/HDL Ratio  Result Value Ref Range   Cholesterol, Total 123 100 - 199 mg/dL   Triglycerides 119 0 - 149 mg/dL   HDL 32 (L) >  39 mg/dL   VLDL Cholesterol Cal 22 5 - 40 mg/dL   LDL Chol Calc (NIH) 69 0 - 99 mg/dL      Assessment & Plan:   Problem List Items Addressed This Visit       Cardiovascular and Mediastinum   Hypertension - Primary    Chronic, stable with BP below goal.  Recommend she monitor BP at least a few mornings a week at home and document.  DASH diet at home.  Continue current medication regimen and adjust as needed.  Labs today: CMP, CBC, urine ALB, TSH.  Return in 6 months.       Relevant Medications   propranolol (INDERAL) 10 MG tablet   atorvastatin (LIPITOR) 20 MG tablet   Other Relevant Orders   TSH   Comprehensive metabolic panel   Microalbumin, Urine Waived     Endocrine   Hypothyroidism    Chronic, stable.  Continue current medication regimen and adjust as needed.  TSH and Free T4 + antibody on labs today.        Relevant Medications   propranolol (INDERAL) 10 MG tablet   Other Relevant Orders   T4, free   Thyroid peroxidase antibody   TSH     Genitourinary   VIN III (vulvar intraepithelial neoplasia III)    Continue collaboration with GYN.        Other   Hyperlipidemia    Chronic, stable.  Continue current medication regimen and adjust as needed.  Lipid panel today.      Relevant Medications   propranolol (INDERAL) 10 MG tablet   atorvastatin (LIPITOR) 20 MG tablet   Other Relevant Orders   Lipid Panel w/o Chol/HDL Ratio   Comprehensive metabolic panel   Obesity    BMI 32.35.  Recommended eating smaller high protein, low fat meals more frequently and exercising 30 mins a day 5 times a week with a goal of 10-15lb weight loss in the next 3 months. Patient voiced their understanding and motivation to adhere to these recommendations.       Sarcoid    Followed by dermatology.  Continue regimen as  prescribed by them, Plaquenil, sees them this afternoon.  Reviewed recent note.  Labs today: CBC and CMP.      Relevant Orders   Comprehensive metabolic panel   CBC with Differential/Platelet   VITAMIN D 25 Hydroxy (Vit-D Deficiency, Fractures)   Other Visit Diagnoses     Flu vaccine need       Flu vaccine today   Relevant Orders   Flu Vaccine QUAD 6+ mos PF IM (Fluarix Quad PF) (Completed)        Follow up plan: Return in about 6 months (around 02/20/2022) for HTN/HLD, SARCOIDOSIS, THYROID.

## 2021-08-22 NOTE — Assessment & Plan Note (Signed)
Chronic, stable.  Continue current medication regimen and adjust as needed.  TSH and Free T4 + antibody on labs today.

## 2021-08-23 ENCOUNTER — Telehealth: Payer: Self-pay

## 2021-08-23 ENCOUNTER — Other Ambulatory Visit: Payer: Self-pay | Admitting: Nurse Practitioner

## 2021-08-23 DIAGNOSIS — D863 Sarcoidosis of skin: Secondary | ICD-10-CM

## 2021-08-23 DIAGNOSIS — E063 Autoimmune thyroiditis: Secondary | ICD-10-CM

## 2021-08-23 LAB — COMPREHENSIVE METABOLIC PANEL
ALT: 17 IU/L (ref 0–32)
AST: 20 IU/L (ref 0–40)
Albumin/Globulin Ratio: 1.3 (ref 1.2–2.2)
Albumin: 4 g/dL (ref 3.8–4.8)
Alkaline Phosphatase: 127 IU/L — ABNORMAL HIGH (ref 44–121)
BUN/Creatinine Ratio: 16 (ref 12–28)
BUN: 12 mg/dL (ref 8–27)
Bilirubin Total: 0.4 mg/dL (ref 0.0–1.2)
CO2: 25 mmol/L (ref 20–29)
Calcium: 9.1 mg/dL (ref 8.7–10.3)
Chloride: 101 mmol/L (ref 96–106)
Creatinine, Ser: 0.75 mg/dL (ref 0.57–1.00)
Globulin, Total: 3 g/dL (ref 1.5–4.5)
Glucose: 87 mg/dL (ref 70–99)
Potassium: 4.3 mmol/L (ref 3.5–5.2)
Sodium: 140 mmol/L (ref 134–144)
Total Protein: 7 g/dL (ref 6.0–8.5)
eGFR: 91 mL/min/{1.73_m2} (ref >59)

## 2021-08-23 LAB — LIPID PANEL W/O CHOL/HDL RATIO
Cholesterol, Total: 128 mg/dL (ref 100–199)
HDL: 35 mg/dL — ABNORMAL LOW (ref >39)
LDL Chol Calc (NIH): 72 mg/dL (ref 0–99)
Triglycerides: 114 mg/dL (ref 0–149)
VLDL Cholesterol Cal: 21 mg/dL (ref 5–40)

## 2021-08-23 LAB — CBC WITH DIFFERENTIAL/PLATELET
Basophils Absolute: 0 10*3/uL (ref 0.0–0.2)
Basos: 1 %
EOS (ABSOLUTE): 0.1 10*3/uL (ref 0.0–0.4)
Eos: 4 %
Hematocrit: 39.5 % (ref 34.0–46.6)
Hemoglobin: 12.4 g/dL (ref 11.1–15.9)
Immature Grans (Abs): 0 10*3/uL (ref 0.0–0.1)
Immature Granulocytes: 0 %
Lymphocytes Absolute: 0.5 10*3/uL — ABNORMAL LOW (ref 0.7–3.1)
Lymphs: 13 %
MCH: 24.7 pg — ABNORMAL LOW (ref 26.6–33.0)
MCHC: 31.4 g/dL — ABNORMAL LOW (ref 31.5–35.7)
MCV: 79 fL (ref 79–97)
Monocytes Absolute: 0.3 10*3/uL (ref 0.1–0.9)
Monocytes: 9 %
Neutrophils Absolute: 2.6 10*3/uL (ref 1.4–7.0)
Neutrophils: 73 %
Platelets: 181 10*3/uL (ref 150–450)
RBC: 5.03 x10E6/uL (ref 3.77–5.28)
RDW: 14.4 % (ref 11.7–15.4)
WBC: 3.6 10*3/uL (ref 3.4–10.8)

## 2021-08-23 LAB — VITAMIN D 25 HYDROXY (VIT D DEFICIENCY, FRACTURES): Vit D, 25-Hydroxy: 17.4 ng/mL — ABNORMAL LOW (ref 30.0–100.0)

## 2021-08-23 LAB — THYROID PEROXIDASE ANTIBODY: Thyroperoxidase Ab SerPl-aCnc: 77 IU/mL — ABNORMAL HIGH (ref 0–34)

## 2021-08-23 LAB — T4, FREE: Free T4: 1.76 ng/dL (ref 0.82–1.77)

## 2021-08-23 LAB — TSH: TSH: 0.259 u[IU]/mL — ABNORMAL LOW (ref 0.450–4.500)

## 2021-08-23 MED ORDER — LEVOTHYROXINE SODIUM 125 MCG PO TABS: 125.0000 ug | ORAL_TABLET | Freq: Every day | ORAL | 4 refills | Status: DC

## 2021-08-23 MED ORDER — HYDROXYCHLOROQUINE SULFATE 200 MG PO TABS: 200.0000 mg | ORAL_TABLET | Freq: Two times a day (BID) | ORAL | 1 refills | Status: DC

## 2021-08-23 NOTE — Telephone Encounter (Signed)
Dr Roseanne Reno reviewed pt labs 08/22/2021 done by PCP (CBC, CMP). Plaquenil 200mg  BID #180 1Rf sent to [facility]. Patient notified.

## 2021-08-23 NOTE — Progress Notes (Signed)
Good afternoon, please let Danielle Sanford know her labs have returned: - Thyroid labs show she is more on hyperthyroid side, a little overactive, and we need to reduce her Levothyroxine to 125 MCG.  I want her to stop 150 MCG dosing and start new dose I send in.  She does show Hashimoto's thyroiditis which we talked about yesterday and treat the same way. - Vitamin D level is a little low, I want her to start taking Vitamin D3 2000 units daily which she can purchase over the counter. - Remainder of labs are stable.  I want her to return in 6 weeks for lab only visit, please schedule.  Any questions? Keep being amazing!!  Thank you for allowing me to participate in your care.  I appreciate you. Kindest regards, Rawlin Reaume

## 2021-10-03 ENCOUNTER — Telehealth: Payer: Self-pay

## 2021-10-03 NOTE — Telephone Encounter (Signed)
Patient overdue for mammogram. Called and discussed with the patient, confirmed she has not had a mammogram since 2018. Patient gave permission for me to call and schedule for her.   Called [facility], scheduled mammogram for Friday 11/10/21 at 1:40 pm.  Called patient back and provided appointment information to her.

## 2021-10-24 ENCOUNTER — Encounter: Payer: Self-pay | Admitting: Nurse Practitioner

## 2022-01-19 ENCOUNTER — Ambulatory Visit
Admission: RE | Admit: 2022-01-19 | Discharge: 2022-01-19 | Disposition: A | Discharge status: Home / Self Care | Payer: BC Managed Care – PPO | Source: Ambulatory Visit | Attending: Nurse Practitioner | Admitting: Nurse Practitioner

## 2022-01-19 DIAGNOSIS — Z1231 Encounter for screening mammogram for malignant neoplasm of breast: Secondary | ICD-10-CM | POA: Insufficient documentation

## 2022-01-22 NOTE — Progress Notes (Signed)
Please let Cresta know her mammogram returned and is normal.  Repeat in one year.

## 2022-02-20 ENCOUNTER — Ambulatory Visit (INDEPENDENT_AMBULATORY_CARE_PROVIDER_SITE_OTHER): Payer: BC Managed Care – PPO | Admitting: Dermatology

## 2022-02-20 ENCOUNTER — Ambulatory Visit: Payer: BC Managed Care – PPO | Admitting: Nurse Practitioner

## 2022-02-20 DIAGNOSIS — Z79899 Other long term (current) drug therapy: Secondary | ICD-10-CM | POA: Diagnosis not present

## 2022-02-20 DIAGNOSIS — D869 Sarcoidosis, unspecified: Secondary | ICD-10-CM | POA: Diagnosis not present

## 2022-02-20 NOTE — Patient Instructions (Signed)
Topical steroids (such as triamcinolone, fluocinolone, fluocinonide, mometasone, clobetasol, halobetasol, betamethasone, hydrocortisone) can cause thinning and lightening of the skin if they are used for too long in the same area. Your physician has selected the right strength medicine for your problem and area affected on the body. Please use your medication only as directed by your physician to prevent side effects.   Hydroxychloroquine (plaquenil) can rarely cause irreversible damage to the retina of the eye if used for a long period of time. This damage can be identified and the medication stopped before vision changes are noticed by going for a yearly ophthalmology exam. Rarely, this medicine can cause low blood counts, liver inflammation, and/or a color change of the skin.  The use of Plaquenil requires long term medication management, including periodic office visits and monitoring of blood work.  Due to recent changes in healthcare laws, you may see results of your pathology and/or laboratory studies on MyChart before the doctors have had a chance to review them. We understand that in some cases there may be results that are confusing or concerning to you. Please understand that not all results are received at the same time and often the doctors may need to interpret multiple results in order to provide you with the best plan of care or course of treatment. Therefore, we ask that you please give [place] 2 business days to thoroughly review all your results before contacting the office for clarification. Should we see a critical lab result, you will be contacted sooner.   If You Need Anything After Your Visit  If you have any questions or concerns for your doctor, please call our main line at [phone] and press option 4 to reach your doctor's medical assistant. If no one answers, please leave a voicemail as directed and we will return your call as soon as possible. Messages left after 4 pm will be  answered the following business day.   You may also send [place] a message via MyChart. We typically respond to MyChart messages within 1-2 business days.  For prescription refills, please ask your pharmacy to contact our office. Our fax number is [phone].  If you have an urgent issue when the clinic is closed that cannot wait until the next business day, you can page your doctor at the number below.    Please note that while we do our best to be available for urgent issues outside of office hours, we are not available 24/7.   If you have an urgent issue and are unable to reach [place], you may choose to seek medical care at your doctor's office, retail clinic, urgent care center, or emergency room.  If you have a medical emergency, please immediately call 911 or go to the emergency department.  Pager Numbers  - Dr. Gwen Pounds: [phone]  - Dr. Neale Burly: [phone]  - Dr. Roseanne Reno: [phone]  In the event of inclement weather, please call our main line at [phone] for an update on the status of any delays or closures.  Dermatology Medication Tips: Please keep the boxes that topical medications come in in order to help keep track of the instructions about where and how to use these. Pharmacies typically print the medication instructions only on the boxes and not directly on the medication tubes.   If your medication is too expensive, please contact our office at [phone] option 4 or send [place] a message through MyChart.   We are unable to tell what your co-pay for medications will be  in advance as this is different depending on your insurance coverage. However, we may be able to find a substitute medication at lower cost or fill out paperwork to get insurance to cover a needed medication.   If a prior authorization is required to get your medication covered by your insurance company, please allow us 1-2 business days to complete this process.  Drug prices often vary depending on  where the prescription is filled and some pharmacies may offer cheaper prices.  The website www.goodrx.com contains coupons for medications through different pharmacies. The prices here do not account for what the cost may be with help from insurance (it may be cheaper with your insurance), but the website can give you the price if you did not use any insurance.  - You can print the associated coupon and take it with your prescription to the pharmacy.  - You may also stop by our office during regular business hours and pick up a GoodRx coupon card.  - If you need your prescription sent electronically to a different pharmacy, notify our office through [facility] MyChart or by phone at [phone] option 4.     Si Usted Necesita Algo Despus de Su Visita  Tambin puede enviarnos un mensaje a travs de Clinical cytogeneticistMyChart. Por lo general respondemos a los mensajes de MyChart en el transcurso de 1 a 2 das hbiles.  Para renovar recetas, por favor pida a su farmacia que se ponga en contacto con nuestra oficina. Annie SableNuestro nmero de fax es [place] [phone].  Si tiene un asunto urgente cuando la clnica est cerrada y que no puede esperar hasta el siguiente da hbil, puede llamar/localizar a su doctor(a) al nmero que aparece a continuacin.   Por favor, tenga en cuenta que aunque hacemos todo lo posible para estar disponibles para asuntos urgentes fuera del horario de [place], no estamos disponibles las 24 horas del da, los 7 [address]  [address]das de la [place].   Si tiene un problema urgente y no puede comunicarse con nosotros, puede optar por buscar atencin mdica  en el consultorio de su doctor(a), en una clnica privada, en un centro de atencin urgente o en una sala de emergencias.  Si tiene Engineer, drillinguna emergencia mdica, por favor llame inmediatamente al 911 o vaya a la sala de emergencias.  Nmeros de bper  - Dr. Gwen PoundsKowalski: [phone]  - Dra. Moye: [phone]  - Dra. Roseanne RenoStewart: [phone]  En caso de inclemencias  del [place], por favor llame a Lacy Duverneynuestra lnea principal al [phone] para una actualizacin sobre el [place] de cualquier retraso o cierre.  Consejos para la medicacin en dermatologa: Por favor, guarde las cajas en las que vienen los medicamentos de uso tpico para ayudarle a seguir las instrucciones sobre dnde y cmo usarlos. Las farmacias generalmente imprimen las instrucciones del medicamento slo en las cajas y no directamente en los tubos del Pamelia Centermedicamento.   Si su medicamento es muy caro, por favor, pngase en contacto con Rolm Galanuestra oficina llamando al [phone] y presione la opcin 4 o envenos un mensaje a travs de Clinical cytogeneticistMyChart.   No podemos decirle cul ser su copago por los medicamentos por adelantado ya que esto es diferente dependiendo de la cobertura de su seguro. Sin embargo, es posible que podamos encontrar un medicamento sustituto a Audiological scientistmenor costo o llenar un formulario para que el seguro cubra el medicamento que se considera necesario.   Si se requiere una autorizacin previa para que su compaa de seguros [place] su medicamento, por favor permtanos de 1 a  2 das hbiles para completar [address]  Los precios de los medicamentos varan con frecuencia dependiendo del Environmental consultant de dnde se surte la receta y alguna farmacias pueden ofrecer precios ms baratos.  El sitio web www.goodrx.com tiene cupones para medicamentos de Health and safety inspector. Los precios aqu no tienen en cuenta lo que podra costar con la ayuda del seguro (puede ser ms barato con su seguro), pero el sitio web puede darle el precio si no utiliz Tourist information centre manager.  - Puede imprimir el cupn correspondiente y llevarlo con su receta a la farmacia.  - Tambin puede pasar por nuestra oficina durante el horario de atencin regular y Education officer, museum una tarjeta de cupones de GoodRx.  - Si necesita que su receta se enve electrnicamente a una farmacia diferente, informe a nuestra oficina a travs de MyChart de [place] o por telfono  llamando al [phone] y presione la opcin 4.

## 2022-02-20 NOTE — Progress Notes (Signed)
Follow-Up Visit   Subjective  Danielle Sanford is a 61 y.o. female who presents for the following: Follow-up (Patient here today for 6 month sarcoidosis follow up. Patient currently taking plaquenil 200 mg BID and using tacrolimus and clobetasol cream as needed. Patient advises she is doing fine on current treatment. ).  Patient advises she has not had any flares. No trouble breathing.  The following portions of the chart were reviewed this encounter and updated as appropriate:       Review of Systems:  No other skin or systemic complaints except as noted in HPI or Assessment and Plan.  Objective  Well appearing patient in no apparent distress; mood and affect are within normal limits.  A focused examination was performed including face, arms, legs. Relevant physical exam findings are noted in the Assessment and Plan.  right upper cutaneous lip, right cheek, nasal ala, ankles, arms Violaceous and white atrophic patches with focal tan translucent papules and plaques at left elbow, right shoulder, upper arm, right medial ankle, left lateral ankle. Pink tan papules at right upper lip, alar crease at right Scarring at right mid cheek Left upper and lower lip with tan translucent plaques    Assessment & Plan  Sarcoidosis right upper cutaneous lip, right cheek, nasal ala, ankles, arms  Chronic condition with duration or expected duration over one year. Stable, not progressing.  Continue clobetasol cream 1-2 times daily to raised active areas as needed. Avoid applying to face, groin, and axilla. Use as directed. Long-term use can cause thinning of the skin.  Continue tacrolimus 0.1% ointment 1-2 times daily to aas face  Pending labs, continue plaquenil 200 mg PO BID.   Topical steroids (such as triamcinolone, fluocinolone, fluocinonide, mometasone, clobetasol, halobetasol, betamethasone, hydrocortisone) can cause thinning and lightening of the skin if they are used for too long in  the same area. Your physician has selected the right strength medicine for your problem and area affected on the body. Please use your medication only as directed by your physician to prevent side effects.   Hydroxychloroquine (plaquenil) can rarely cause irreversible damage to the retina of the eye if used for a long period of time. This damage can be identified and the medication stopped before vision changes are noticed by going for a yearly ophthalmology exam. Rarely, this medicine can cause low blood counts, liver inflammation, and/or a color change of the skin.  The use of Plaquenil requires long term medication management, including periodic office visits and monitoring of blood work.  Patient will have eye exam this July.  Patient's most recent labs reviewed. Discussed with patient that her Vitamin D levels were low and that she should be taking a supplement.  Patient having labs done with PCP later this month, will add CBC/diff, CMP   CBC with Differential/Platelets - right upper cutaneous lip, right cheek, nasal ala, ankles, arms  CMP - right upper cutaneous lip, right cheek, nasal ala, ankles, arms  Related Medications clobetasol cream (TEMOVATE) 0.05 % Apply 1 application topically 2 (two) times daily. Apply to raised, scaly and brown areas. Avoid face, axilla, and folds  tacrolimus (PROTOPIC) 0.1 % ointment Apply topically 2 (two) times daily as needed. To affected areas of face and body   Return in about 6 months (around 08/22/2022) for sarcoid.  Anise Salvo, RMA, am acting as scribe for Willeen Niece, MD .  Documentation: I have reviewed the above documentation for accuracy and completeness, and I agree with the above.  Brendolyn Patty MD

## 2022-02-24 NOTE — Patient Instructions (Incomplete)

## 2022-02-26 ENCOUNTER — Ambulatory Visit: Payer: BC Managed Care – PPO | Admitting: Nurse Practitioner

## 2022-03-12 ENCOUNTER — Other Ambulatory Visit: Payer: Self-pay | Admitting: Nurse Practitioner

## 2022-03-12 DIAGNOSIS — E063 Autoimmune thyroiditis: Secondary | ICD-10-CM

## 2022-03-13 MED ORDER — LEVOTHYROXINE SODIUM 125 MCG PO TABS: 125.0000 ug | ORAL_TABLET | Freq: Every day | ORAL | 4 refills | Status: DC

## 2022-03-13 NOTE — Telephone Encounter (Signed)
Pt called to report that she is completely out of her current supply, please advise

## 2022-03-14 ENCOUNTER — Ambulatory Visit: Payer: Self-pay | Admitting: *Deleted

## 2022-03-14 NOTE — Telephone Encounter (Signed)
Spoke with pharmacist and provided OK for pharmacist to change brand name to. Pharmacist verbalized understanding and had no further questions.

## 2022-03-14 NOTE — Telephone Encounter (Signed)
Summary: Medication issue   [facility] would like to discuss with the medication levothyroxine (SYNTHROID) 125 MCG tablet.Stated Needs to get a different brand from what she is normally taking. With the type of medication, it needs to get the okay from pt PCP to change the brand.   Rep mentioned that the pt medication that is sent by [facility] is not what the pharmacy has.It can throw off pt levels by changing the manufacturer.  Mentioned that if medication isn't changed r the patient goes without.      Pt took her last pill today.       No contact with patient or pharmacy. Please review request for a different brand name for levothyroxine 125 mcg. Different brand needs approval from PCP. Please advise . FC Alexis called and no answer. Message sent to [facility] to notify PCP asap.     Reason for Disposition  [1] Pharmacy calling with prescription question AND [2] triager unable to answer question  Answer Assessment - Initial Assessment Questions 1. NAME of MEDICATION: "What medicine are you calling about?"     Levothyroxine 125 mcg 2. QUESTION: "What is your question?" (e.g., double dose of medicine, side effect)     Prior approval from PCP for brand of medication due to can not provide accurate results for patient  3. PRESCRIBING HCP: "Who prescribed it?" Reason: if prescribed by specialist, call should be referred to that group.     PCP 4. SYMPTOMS: "Do you have any symptoms?"     na 5. SEVERITY: If symptoms are present, ask "Are they mild, moderate or severe?"     na 6. PREGNANCY:  "Is there any chance that you are pregnant?" "When was your last menstrual period?"     na  Protocols used: Medication Question Call-A-AH

## 2022-03-18 NOTE — Patient Instructions (Signed)

## 2022-03-21 ENCOUNTER — Ambulatory Visit (INDEPENDENT_AMBULATORY_CARE_PROVIDER_SITE_OTHER): Payer: BC Managed Care – PPO | Admitting: Nurse Practitioner

## 2022-03-21 ENCOUNTER — Encounter: Payer: Self-pay | Admitting: Nurse Practitioner

## 2022-03-21 VITALS — BP 112/66 | HR 64 | Temp 98.1°F | Ht 66.0 in | Wt 200.4 lb

## 2022-03-21 DIAGNOSIS — Z6832 Body mass index (BMI) 32.0-32.9, adult: Secondary | ICD-10-CM

## 2022-03-21 DIAGNOSIS — E6609 Other obesity due to excess calories: Secondary | ICD-10-CM

## 2022-03-21 DIAGNOSIS — E782 Mixed hyperlipidemia: Secondary | ICD-10-CM

## 2022-03-21 DIAGNOSIS — E559 Vitamin D deficiency, unspecified: Secondary | ICD-10-CM | POA: Diagnosis not present

## 2022-03-21 DIAGNOSIS — E063 Autoimmune thyroiditis: Secondary | ICD-10-CM | POA: Diagnosis not present

## 2022-03-21 DIAGNOSIS — I10 Essential (primary) hypertension: Secondary | ICD-10-CM

## 2022-03-21 DIAGNOSIS — D869 Sarcoidosis, unspecified: Secondary | ICD-10-CM | POA: Diagnosis not present

## 2022-03-21 NOTE — Assessment & Plan Note (Signed)
Chronic, stable.  Continue current medication regimen and adjust as needed.  TSH and Free T4 today. 

## 2022-03-21 NOTE — Assessment & Plan Note (Signed)
Chronic, stable with BP below goal. Recommend she monitor BP at least a few mornings a week at home and document. DASH diet at home. Continue current medication regimen and adjust as needed. CMP, CBC, TSH today. Return in 6 months.

## 2022-03-21 NOTE — Assessment & Plan Note (Signed)
BMI 32.35.  Recommended eating smaller high protein, low fat meals more frequently and exercising 30 mins a day 5 times a week with a goal of 10-15lb weight loss in the next 3 months. Patient voiced their understanding and motivation to adhere to these recommendations.  

## 2022-03-21 NOTE — Progress Notes (Deleted)
BP 112/66   Pulse 64   Temp 98.1 F (36.7 C) (Oral)   Ht 5' 6" (1.676 m)   Wt 200 lb 6.4 oz (90.9 kg)   LMP  (LMP Unknown)   SpO2 96%   BMI 32.35 kg/m    Subjective:    Patient ID: Danielle Sanford, female    DOB: 1961-06-07, 61 y.o.   MRN: [id number]  HPI: Danielle Sanford is a 61 y.o. female  Chief Complaint  Patient presents with   Hyperlipidemia   Hypertension   Sarcoidosis   Hypothyroidism   HYPERTENSION / HYPERLIPIDEMIA Continues on Lipitor for HLD and Propranolol for HTN. Satisfied with current treatment? {Blank single:19197::"yes","no"} Duration of hypertension: {Blank single:19197::"chronic","months","years"} BP monitoring frequency: {Blank single:19197::"not checking","rarely","daily","weekly","monthly","a few times a day","a few times a week","a few times a month"} BP range:  BP medication side effects: {Blank single:19197::"yes","no"} Duration of hyperlipidemia: {Blank single:19197::"chronic","months","years"} Cholesterol medication side effects: {Blank single:19197::"yes","no"} Cholesterol supplements: {Blank multiple:19196::"none","fish oil","niacin","red yeast rice"} Medication compliance: {Blank single:19197::"excellent compliance","good compliance","fair compliance","poor compliance"} Aspirin: {Blank single:19197::"yes","no"} Recent stressors: {Blank single:19197::"yes","no"} Recurrent headaches: {Blank single:19197::"yes","no"} Visual changes: {Blank single:19197::"yes","no"} Palpitations: {Blank single:19197::"yes","no"} Dyspnea: {Blank single:19197::"yes","no"} Chest pain: {Blank single:19197::"yes","no"} Lower extremity edema: {Blank single:19197::"yes","no"} Dizzy/lightheaded: {Blank single:19197::"yes","no"}   HYPOTHYROIDISM Continues on Levothyroxine 150 MCG. Thyroid control status:{Blank single:19197::"controlled","uncontrolled","better","worse","exacerbated","stable"} Satisfied with current treatment? {Blank  single:19197::"yes","no"} Medication side effects: {Blank single:19197::"yes","no"} Medication compliance: {Blank single:19197::"excellent compliance","good compliance","fair compliance","poor compliance"} Etiology of hypothyroidism:  Recent dose adjustment:{Blank single:19197::"yes","no"} Fatigue: {Blank single:19197::"yes","no"} Cold intolerance: {Blank single:19197::"yes","no"} Heat intolerance: {Blank single:19197::"yes","no"} Weight gain: {Blank single:19197::"yes","no"} Weight loss: {Blank single:19197::"yes","no"} Constipation: {Blank single:19197::"yes","no"} Diarrhea/loose stools: {Blank single:19197::"yes","no"} Palpitations: {Blank single:19197::"yes","no"} Lower extremity edema: {Blank single:19197::"yes","no"} Anxiety/depressed mood: {Blank single:19197::"yes","no"}    SARCOIDOSIS OF SKIN: Continues Plaquenil for sarcoidosis.  Was followed by [facility] rheumatology, but has not been seen since 05/17/2016.  Now followed by dermatology, last saw 02/20/22.  Last platelet count 181.  Denies easy bruising, blood in stool, nose bleeds, or gum bleeding.   History of skin cancer: {Blank single:19197::"yes","no"} History of precancerous skin lesions: {Blank single:19197::"yes","no"} Family history of skin cancer: {Blank single:19197::"yes","no"}   Relevant past medical, surgical, family and social history reviewed and updated as indicated. Interim medical history since our last visit reviewed. Allergies and medications reviewed and updated.  Review of Systems  Per HPI unless specifically indicated above     Objective:    BP 112/66   Pulse 64   Temp 98.1 F (36.7 C) (Oral)   Ht 5' 6" (1.676 m)   Wt 200 lb 6.4 oz (90.9 kg)   LMP  (LMP Unknown)   SpO2 96%   BMI 32.35 kg/m   Wt Readings from Last 3 Encounters:  03/21/22 200 lb 6.4 oz (90.9 kg)  08/22/21 200 lb 6.4 oz (90.9 kg)  06/28/21 199 lb 12.8 oz (90.6 kg)    Physical Exam Results for orders placed or performed in visit on  08/22/21  T4, free  Result Value Ref Range   Free T4 1.76 0.82 - 1.77 ng/dL  Thyroid peroxidase antibody  Result Value Ref Range   Thyroperoxidase Ab SerPl-aCnc 77 (H) 0 - 34 IU/mL  TSH  Result Value Ref Range   TSH 0.259 (L) 0.450 - 4.500 uIU/mL  Lipid Panel w/o Chol/HDL Ratio  Result Value Ref Range   Cholesterol, Total 128 100 - 199 mg/dL   Triglycerides 114 0 - 149 mg/dL   HDL 35 (L) >39 mg/dL   VLDL Cholesterol Cal 21 5 - 40 mg/dL   LDL  Chol Calc (NIH) 72 0 - 99 mg/dL  Comprehensive metabolic panel  Result Value Ref Range   Glucose 87 70 - 99 mg/dL   BUN 12 8 - 27 mg/dL   Creatinine, Ser 0.75 0.57 - 1.00 mg/dL   eGFR 91 >59 mL/min/1.73   BUN/Creatinine Ratio 16 12 - 28   Sodium 140 134 - 144 mmol/L   Potassium 4.3 3.5 - 5.2 mmol/L   Chloride 101 96 - 106 mmol/L   CO2 25 20 - 29 mmol/L   Calcium 9.1 8.7 - 10.3 mg/dL   Total Protein 7.0 6.0 - 8.5 g/dL   Albumin 4.0 3.8 - 4.8 g/dL   Globulin, Total 3.0 1.5 - 4.5 g/dL   Albumin/Globulin Ratio 1.3 1.2 - 2.2   Bilirubin Total 0.4 0.0 - 1.2 mg/dL   Alkaline Phosphatase 127 (H) 44 - 121 IU/L   AST 20 0 - 40 IU/L   ALT 17 0 - 32 IU/L  CBC with Differential/Platelet  Result Value Ref Range   WBC 3.6 3.4 - 10.8 x10E3/uL   RBC 5.03 3.77 - 5.28 x10E6/uL   Hemoglobin 12.4 11.1 - 15.9 g/dL   Hematocrit 39.5 34.0 - 46.6 %   MCV 79 79 - 97 fL   MCH 24.7 (L) 26.6 - 33.0 pg   MCHC 31.4 (L) 31.5 - 35.7 g/dL   RDW 14.4 11.7 - 15.4 %   Platelets 181 150 - 450 x10E3/uL   Neutrophils 73 Not Estab. %   Lymphs 13 Not Estab. %   Monocytes 9 Not Estab. %   Eos 4 Not Estab. %   Basos 1 Not Estab. %   Neutrophils Absolute 2.6 1.4 - 7.0 x10E3/uL   Lymphocytes Absolute 0.5 (L) 0.7 - 3.1 x10E3/uL   Monocytes Absolute 0.3 0.1 - 0.9 x10E3/uL   EOS (ABSOLUTE) 0.1 0.0 - 0.4 x10E3/uL   Basophils Absolute 0.0 0.0 - 0.2 x10E3/uL   Immature Granulocytes 0 Not Estab. %   Immature Grans (Abs) 0.0 0.0 - 0.1 x10E3/uL  VITAMIN D 25 Hydroxy (Vit-D  Deficiency, Fractures)  Result Value Ref Range   Vit D, 25-Hydroxy 17.4 (L) 30.0 - 100.0 ng/mL  Microalbumin, Urine Waived  Result Value Ref Range   Microalb, Ur Waived 10 0 - 19 mg/L   Creatinine, Urine Waived 50 10 - 300 mg/dL   Microalb/Creat Ratio 30-300 (H) <30 mg/g      Assessment & Plan:   Problem List Items Addressed This Visit       Cardiovascular and Mediastinum   Hypertension - Primary   Relevant Orders   Comprehensive metabolic panel   CBC with Differential/Platelet     Endocrine   Hashimoto's thyroiditis   Relevant Orders   T4, free   TSH     Other   Hyperlipidemia   Relevant Orders   Comprehensive metabolic panel   Lipid Panel w/o Chol/HDL Ratio   Obesity   Sarcoid   Other Visit Diagnoses     Vitamin D deficiency       Relevant Orders   VITAMIN D 25 Hydroxy (Vit-D Deficiency, Fractures)        Follow up plan: Return in about 6 months (around 09/21/2022) for HTN/HLD, SARCOIDOSIS, THYROID.

## 2022-03-21 NOTE — Assessment & Plan Note (Signed)
Chronic, stable.  Continue current medication regimen and adjust as needed.  Lipid panel today. 

## 2022-03-21 NOTE — Assessment & Plan Note (Signed)
Continue collaboration with dermatology. Continue regimen as prescribed by them. CBC and CMP today.

## 2022-03-21 NOTE — Progress Notes (Signed)
BP 112/66   Pulse 64   Temp 98.1 F (36.7 C) (Oral)   Ht 5' 6"  (1.676 m)   Wt 200 lb 6.4 oz (90.9 kg)   LMP  (LMP Unknown)   SpO2 96%   BMI 32.35 kg/m    Subjective:    Patient ID: Danielle Sanford, female    DOB: May 15, 1961, 61 y.o.   MRN: [id number]  HPI: Danielle Sanford is a 61 y.o. female  Chief Complaint  Patient presents with   Hyperlipidemia   Hypertension   Sarcoidosis   Hypothyroidism   NOTE WRITTEN BY FNP STUDENT.  ASSESSMENT AND PLAN OF CARE REVIEWED WITH STUDENT, AGREE WITH ABOVE FINDINGS AND PLAN.   HYPERTENSION / HYPERLIPIDEMIA Continues on Lipitor for HLD and Propranolol for HTN. Satisfied with current treatment? no Duration of hypertension: years BP monitoring frequency: rarely BP range:  BP medication side effects: no Duration of hyperlipidemia: years Cholesterol medication side effects: no Cholesterol supplements: none Medication compliance: excellent compliance Aspirin: no Recent stressors: no Recurrent headaches: no Visual changes: no Palpitations: no Dyspnea: no Chest pain: no Lower extremity edema: no Dizzy/lightheaded: no   HYPOTHYROIDISM Continues on Levothyroxine 150 MCG. Thyroid control status:stable Satisfied with current treatment? no Medication side effects: no Medication compliance: excellent compliance Etiology of hypothyroidism:  Recent dose adjustment:no Fatigue: no Cold intolerance: no Heat intolerance: no Weight gain: no Weight loss: no Constipation: no Diarrhea/loose stools: no Palpitations: no Lower extremity edema: no Anxiety/depressed mood: no    SARCOIDOSIS OF SKIN: Continues Plaquenil for sarcoidosis.  Was followed by [facility] rheumatology, but has not been seen since 05/17/2016.  Now followed by dermatology, last saw 02/20/22.  Last platelet count 181.  Denies easy bruising, blood in stool, nose bleeds, or gum bleeding.   History of skin cancer: no History of precancerous skin lesions: no Family history of  skin cancer: no   Relevant past medical, surgical, family and social history reviewed and updated as indicated. Interim medical history since our last visit reviewed. Allergies and medications reviewed and updated.  Review of Systems  Constitutional:  Negative for chills, fatigue, fever and unexpected weight change.  HENT: Negative.    Eyes: Negative.   Respiratory:  Negative for cough, chest tightness and shortness of breath.   Cardiovascular:  Negative for chest pain, palpitations and leg swelling.  Gastrointestinal:  Negative for abdominal pain, constipation and diarrhea.  Endocrine: Negative for cold intolerance, heat intolerance, polydipsia, polyphagia and polyuria.  Genitourinary: Negative.   Musculoskeletal: Negative.   Neurological:  Negative for dizziness, weakness, light-headedness and headaches.  Psychiatric/Behavioral: Negative.      Per HPI unless specifically indicated above     Objective:    BP 112/66   Pulse 64   Temp 98.1 F (36.7 C) (Oral)   Ht 5' 6"  (1.676 m)   Wt 200 lb 6.4 oz (90.9 kg)   LMP  (LMP Unknown)   SpO2 96%   BMI 32.35 kg/m   Wt Readings from Last 3 Encounters:  03/21/22 200 lb 6.4 oz (90.9 kg)  08/22/21 200 lb 6.4 oz (90.9 kg)  06/28/21 199 lb 12.8 oz (90.6 kg)    Physical Exam Vitals and nursing note reviewed.  Constitutional:      General: She is awake. She is not in acute distress.    Appearance: She is well-developed and well-groomed. She is obese. She is not ill-appearing.  HENT:     Head: Normocephalic.     Right Ear: Hearing normal.  Left Ear: Hearing normal.  Eyes:     General: Lids are normal.        Right eye: No discharge.        Left eye: No discharge.     Conjunctiva/sclera: Conjunctivae normal.     Pupils: Pupils are equal, round, and reactive to light.  Neck:     Thyroid: No thyromegaly or thyroid tenderness.     Vascular: No carotid bruit.  Cardiovascular:     Rate and Rhythm: Normal rate and regular rhythm.      Heart sounds: Normal heart sounds. No murmur heard.    No gallop.  Pulmonary:     Effort: Pulmonary effort is normal. No accessory muscle usage or respiratory distress.     Breath sounds: Normal breath sounds.  Abdominal:     General: Bowel sounds are normal.     Palpations: Abdomen is soft.  Musculoskeletal:     Cervical back: Normal range of motion and neck supple.     Right lower leg: No edema.     Left lower leg: No edema.  Lymphadenopathy:     Head:     Right side of head: No submental, submandibular, tonsillar, preauricular or posterior auricular adenopathy.     Left side of head: No submental, submandibular, tonsillar, preauricular or posterior auricular adenopathy.  Skin:    General: Skin is warm and dry.  Neurological:     General: No focal deficit present.     Mental Status: She is alert and oriented to person, place, and time.     Deep Tendon Reflexes: Reflexes are normal and symmetric.  Psychiatric:        Attention and Perception: Attention normal.        Mood and Affect: Mood normal.        Speech: Speech normal.        Behavior: Behavior normal. Behavior is cooperative.        Thought Content: Thought content normal.    Results for orders placed or performed in visit on 08/22/21  T4, free  Result Value Ref Range   Free T4 1.76 0.82 - 1.77 ng/dL  Thyroid peroxidase antibody  Result Value Ref Range   Thyroperoxidase Ab SerPl-aCnc 77 (H) 0 - 34 IU/mL  TSH  Result Value Ref Range   TSH 0.259 (L) 0.450 - 4.500 uIU/mL  Lipid Panel w/o Chol/HDL Ratio  Result Value Ref Range   Cholesterol, Total 128 100 - 199 mg/dL   Triglycerides 114 0 - 149 mg/dL   HDL 35 (L) >39 mg/dL   VLDL Cholesterol Cal 21 5 - 40 mg/dL   LDL Chol Calc (NIH) 72 0 - 99 mg/dL  Comprehensive metabolic panel  Result Value Ref Range   Glucose 87 70 - 99 mg/dL   BUN 12 8 - 27 mg/dL   Creatinine, Ser 0.75 0.57 - 1.00 mg/dL   eGFR 91 >59 mL/min/1.73   BUN/Creatinine Ratio 16 12 - 28    Sodium 140 134 - 144 mmol/L   Potassium 4.3 3.5 - 5.2 mmol/L   Chloride 101 96 - 106 mmol/L   CO2 25 20 - 29 mmol/L   Calcium 9.1 8.7 - 10.3 mg/dL   Total Protein 7.0 6.0 - 8.5 g/dL   Albumin 4.0 3.8 - 4.8 g/dL   Globulin, Total 3.0 1.5 - 4.5 g/dL   Albumin/Globulin Ratio 1.3 1.2 - 2.2   Bilirubin Total 0.4 0.0 - 1.2 mg/dL   Alkaline Phosphatase 127 (H) 44 -  121 IU/L   AST 20 0 - 40 IU/L   ALT 17 0 - 32 IU/L  CBC with Differential/Platelet  Result Value Ref Range   WBC 3.6 3.4 - 10.8 x10E3/uL   RBC 5.03 3.77 - 5.28 x10E6/uL   Hemoglobin 12.4 11.1 - 15.9 g/dL   Hematocrit 39.5 34.0 - 46.6 %   MCV 79 79 - 97 fL   MCH 24.7 (L) 26.6 - 33.0 pg   MCHC 31.4 (L) 31.5 - 35.7 g/dL   RDW 14.4 11.7 - 15.4 %   Platelets 181 150 - 450 x10E3/uL   Neutrophils 73 Not Estab. %   Lymphs 13 Not Estab. %   Monocytes 9 Not Estab. %   Eos 4 Not Estab. %   Basos 1 Not Estab. %   Neutrophils Absolute 2.6 1.4 - 7.0 x10E3/uL   Lymphocytes Absolute 0.5 (L) 0.7 - 3.1 x10E3/uL   Monocytes Absolute 0.3 0.1 - 0.9 x10E3/uL   EOS (ABSOLUTE) 0.1 0.0 - 0.4 x10E3/uL   Basophils Absolute 0.0 0.0 - 0.2 x10E3/uL   Immature Granulocytes 0 Not Estab. %   Immature Grans (Abs) 0.0 0.0 - 0.1 x10E3/uL  VITAMIN D 25 Hydroxy (Vit-D Deficiency, Fractures)  Result Value Ref Range   Vit D, 25-Hydroxy 17.4 (L) 30.0 - 100.0 ng/mL  Microalbumin, Urine Waived  Result Value Ref Range   Microalb, Ur Waived 10 0 - 19 mg/L   Creatinine, Urine Waived 50 10 - 300 mg/dL   Microalb/Creat Ratio 30-300 (H) <30 mg/g      Assessment & Plan:   Problem List Items Addressed This Visit       Cardiovascular and Mediastinum   Hypertension - Primary    Chronic, stable with BP below goal. Recommend she monitor BP at least a few mornings a week at home and document. DASH diet at home. Continue current medication regimen and adjust as needed. CMP, CBC, TSH today. Return in 6 months.       Relevant Orders   Comprehensive metabolic panel    CBC with Differential/Platelet     Endocrine   Hashimoto's thyroiditis    Chronic, stable. Continue current medication regimen and adjust as needed. TSH and Free T4 today.       Relevant Orders   T4, free   TSH     Other   Sarcoid    Continue collaboration with dermatology. Continue regimen as prescribed by them. CBC and CMP today.       Hyperlipidemia    Chronic, stable. Continue current medication regimen and adjust as needed. Lipid panel today.       Relevant Orders   Comprehensive metabolic panel   Lipid Panel w/o Chol/HDL Ratio   Obesity    BMI 32.35. Recommended eating smaller high protein, low fat meals more frequently and exercising 30 mins a day 5 times a week with a goal of 10-15lb weight loss in the next 3 months. Patient voiced their understanding and motivation to adhere to these recommendations.       Other Visit Diagnoses     Vitamin D deficiency       Recheck level today.   Relevant Orders   VITAMIN D 25 Hydroxy (Vit-D Deficiency, Fractures)        Follow up plan: Return in about 6 months (around 09/21/2022) for HTN/HLD, SARCOIDOSIS, THYROID.

## 2022-03-22 ENCOUNTER — Other Ambulatory Visit: Payer: Self-pay | Admitting: Nurse Practitioner

## 2022-03-22 ENCOUNTER — Telehealth: Payer: Self-pay

## 2022-03-22 DIAGNOSIS — E063 Autoimmune thyroiditis: Secondary | ICD-10-CM

## 2022-03-22 DIAGNOSIS — D863 Sarcoidosis of skin: Secondary | ICD-10-CM

## 2022-03-22 LAB — COMPREHENSIVE METABOLIC PANEL
ALT: 15 IU/L (ref 0–32)
AST: 20 IU/L (ref 0–40)
Albumin/Globulin Ratio: 1.3 (ref 1.2–2.2)
Albumin: 3.8 g/dL (ref 3.8–4.8)
Alkaline Phosphatase: 109 IU/L (ref 44–121)
BUN/Creatinine Ratio: 8 — ABNORMAL LOW (ref 12–28)
BUN: 7 mg/dL — ABNORMAL LOW (ref 8–27)
Bilirubin Total: 0.5 mg/dL (ref 0.0–1.2)
CO2: 26 mmol/L (ref 20–29)
Calcium: 9.4 mg/dL (ref 8.7–10.3)
Chloride: 104 mmol/L (ref 96–106)
Creatinine, Ser: 0.84 mg/dL (ref 0.57–1.00)
Globulin, Total: 3 g/dL (ref 1.5–4.5)
Glucose: 84 mg/dL (ref 70–99)
Potassium: 4.1 mmol/L (ref 3.5–5.2)
Sodium: 140 mmol/L (ref 134–144)
Total Protein: 6.8 g/dL (ref 6.0–8.5)
eGFR: 79 mL/min/{1.73_m2} (ref >59)

## 2022-03-22 LAB — LIPID PANEL W/O CHOL/HDL RATIO
Cholesterol, Total: 125 mg/dL (ref 100–199)
HDL: 37 mg/dL — ABNORMAL LOW (ref >39)
LDL Chol Calc (NIH): 65 mg/dL (ref 0–99)
Triglycerides: 127 mg/dL (ref 0–149)
VLDL Cholesterol Cal: 23 mg/dL (ref 5–40)

## 2022-03-22 LAB — CBC WITH DIFFERENTIAL/PLATELET
Basophils Absolute: 0 10*3/uL (ref 0.0–0.2)
Basos: 1 %
EOS (ABSOLUTE): 0.1 10*3/uL (ref 0.0–0.4)
Eos: 4 %
Hematocrit: 36.8 % (ref 34.0–46.6)
Hemoglobin: 11.9 g/dL (ref 11.1–15.9)
Immature Grans (Abs): 0 10*3/uL (ref 0.0–0.1)
Immature Granulocytes: 0 %
Lymphocytes Absolute: 0.6 10*3/uL — ABNORMAL LOW (ref 0.7–3.1)
Lymphs: 15 %
MCH: 25.4 pg — ABNORMAL LOW (ref 26.6–33.0)
MCHC: 32.3 g/dL (ref 31.5–35.7)
MCV: 79 fL (ref 79–97)
Monocytes Absolute: 0.5 10*3/uL (ref 0.1–0.9)
Monocytes: 11 %
Neutrophils Absolute: 2.8 10*3/uL (ref 1.4–7.0)
Neutrophils: 69 %
Platelets: 184 10*3/uL (ref 150–450)
RBC: 4.68 x10E6/uL (ref 3.77–5.28)
RDW: 14.2 % (ref 11.7–15.4)
WBC: 4.1 10*3/uL (ref 3.4–10.8)

## 2022-03-22 LAB — VITAMIN D 25 HYDROXY (VIT D DEFICIENCY, FRACTURES): Vit D, 25-Hydroxy: 13.4 ng/mL — ABNORMAL LOW (ref 30.0–100.0)

## 2022-03-22 LAB — TSH: TSH: 6.16 u[IU]/mL — ABNORMAL HIGH (ref 0.450–4.500)

## 2022-03-22 LAB — T4, FREE: Free T4: 1.25 ng/dL (ref 0.82–1.77)

## 2022-03-22 MED ORDER — CHOLECALCIFEROL 1.25 MG (50000 UT) PO TABS: 1.0000 | ORAL_TABLET | ORAL | 4 refills | Status: DC

## 2022-03-22 MED ORDER — HYDROXYCHLOROQUINE SULFATE 200 MG PO TABS: 200.0000 mg | ORAL_TABLET | Freq: Two times a day (BID) | ORAL | 1 refills | Status: DC

## 2022-03-22 MED ORDER — LEVOTHYROXINE SODIUM 137 MCG PO TABS: 137.0000 ug | ORAL_TABLET | Freq: Every day | ORAL | 2 refills | Status: DC

## 2022-03-22 NOTE — Telephone Encounter (Signed)
-----   Message from Willeen Niece, MD sent at 03/22/2022  5:21 PM EDT ----- CBC/diff, LFTs, Bun/Cr all nl, can send in 5 rfs Plaquenil   - please call patient

## 2022-03-22 NOTE — Progress Notes (Signed)
Good afternoon, please let [name] know her labs have returned: - Thyroid labs are showing elevation in TSH, meaning thyroid is more sluggish at the moment.  I would like to increase her Levothyroxine to help get better control of thyroid.  I want her to stop Levothyroxine 125 MCG dosing and start the Levothyroxine 137 MCG dosing I sent in. Then she will need to return in 6 weeks for lab only visit to recheck levels, please assist her in scheduling this. - Cholesterol levels are at goal, continue Atorvastatin as ordered. - Vitamin D is on low side still, I am sending in a weekly Vitamin D supplement I want her to start taking for this and we will recheck next visit. - Kidney and liver function are normal.  Any questions? Keep being amazing!!  Thank you for allowing me to participate in your care.  I appreciate you. Kindest regards, Dearis Danis

## 2022-03-22 NOTE — Telephone Encounter (Signed)
Lft pt msg to call for lab results, sent in refills of plaquenil./sh

## 2022-05-04 ENCOUNTER — Other Ambulatory Visit: Payer: BC Managed Care – PPO

## 2022-05-31 DIAGNOSIS — H02054 Trichiasis without entropian left upper eyelid: Secondary | ICD-10-CM | POA: Diagnosis not present

## 2022-07-03 IMAGING — MG MM DIGITAL SCREENING BILAT W/ TOMO AND CAD
8 series · 8 of 24 positions shown · non-contrast
Comparison: Previous exam(s).

CLINICAL DATA: Screening.

EXAM:
DIGITAL SCREENING BILATERAL MAMMOGRAM WITH TOMOSYNTHESIS AND CAD
TECHNIQUE: Bilateral screening digital craniocaudal and mediolateral oblique
mammograms were obtained. Bilateral screening digital breast
tomosynthesis was performed. The images were evaluated with
computer-aided detection.

[L CC synth-2D]
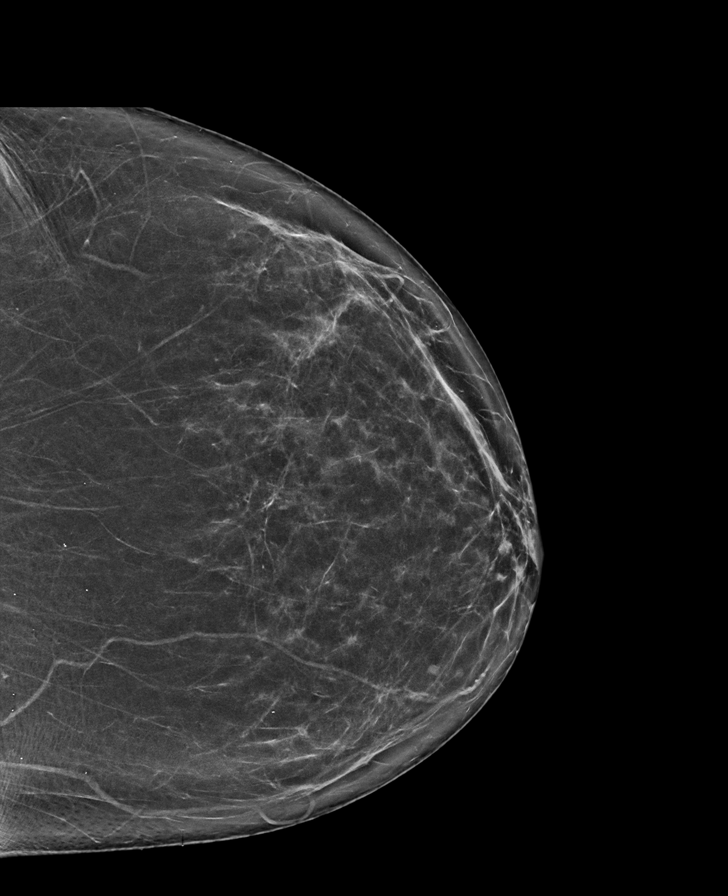

[R MLO synth-2D]
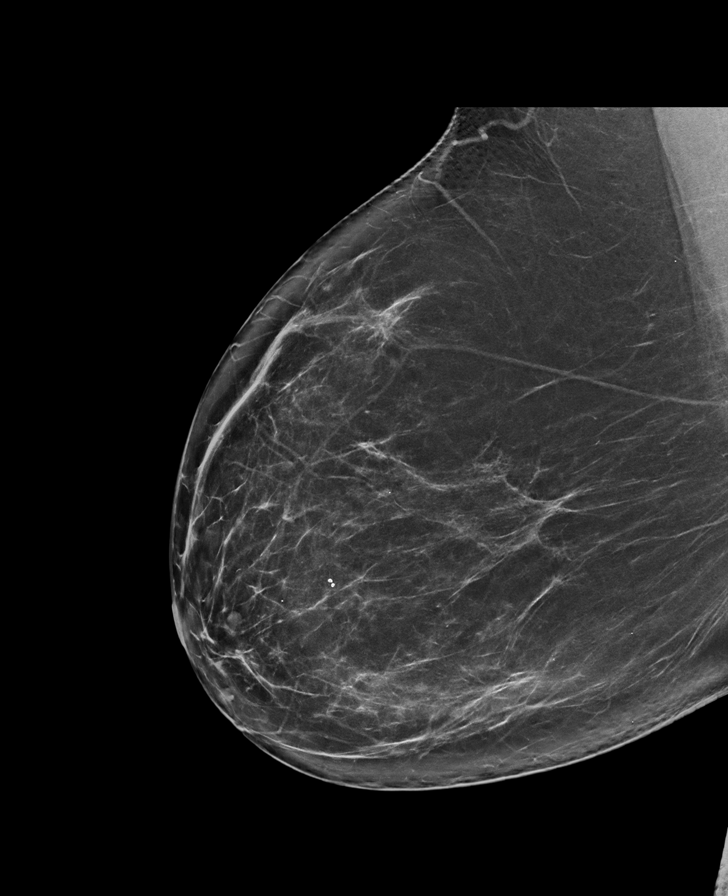

[R CC synth-2D]
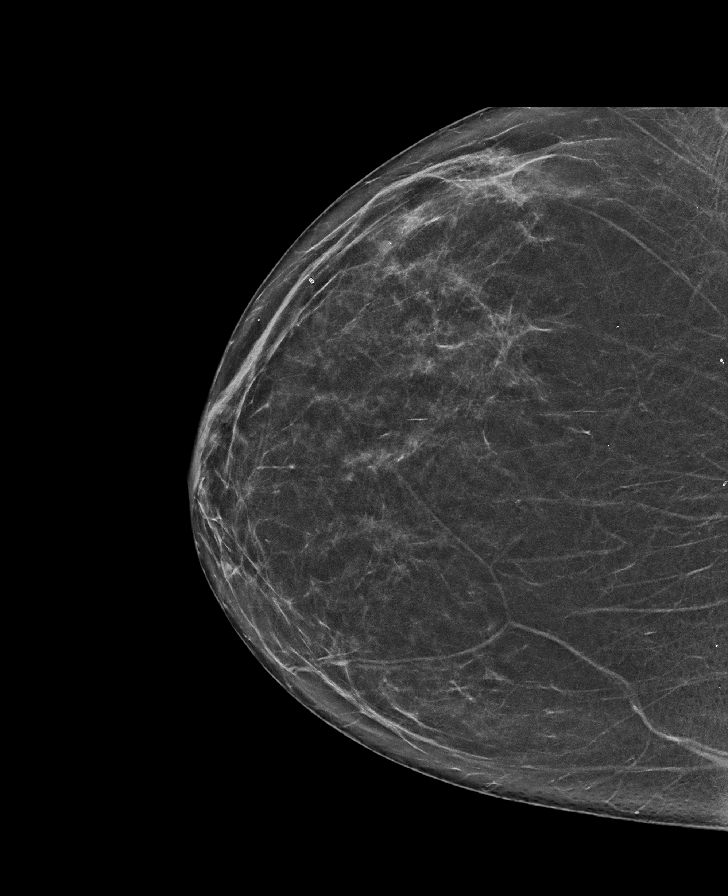

[L MLO synth-2D]
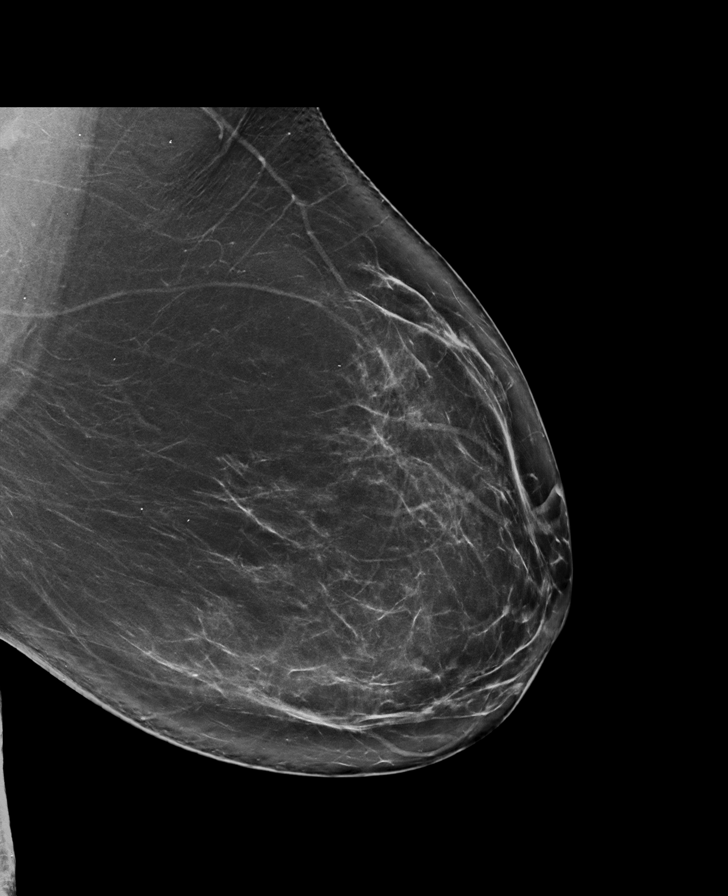

[L CC tomo · tomo slice 37/74.0]
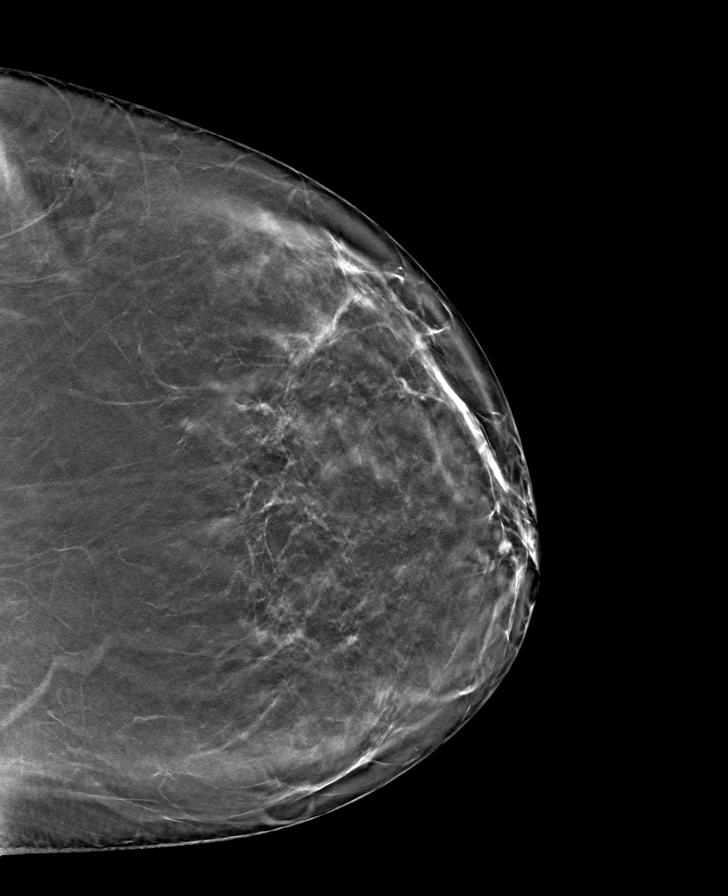

[L MLO tomo · tomo slice 45/90.0]
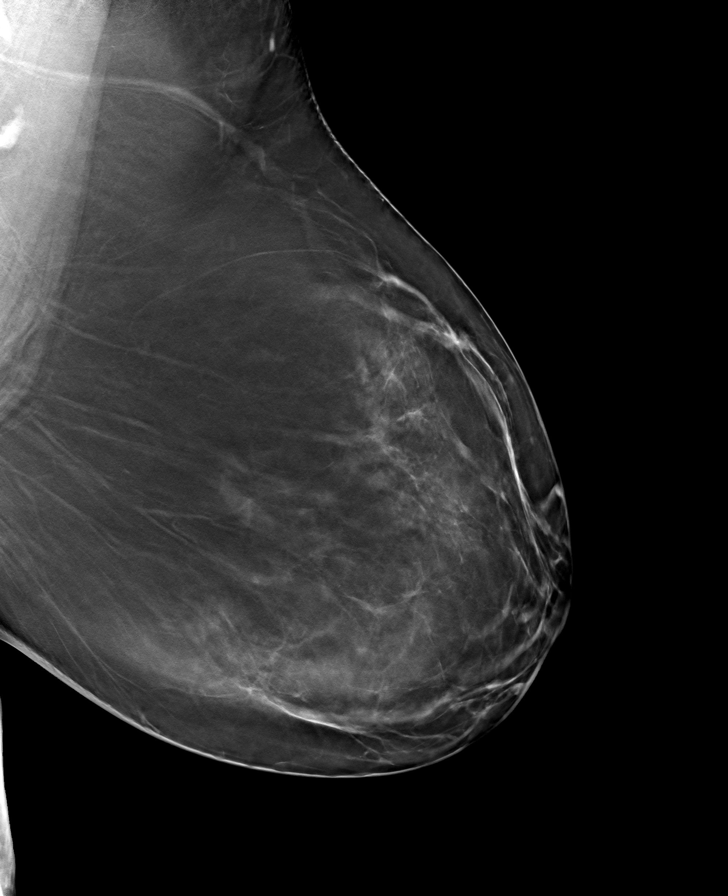

[R CC tomo · tomo slice 37/73.0]
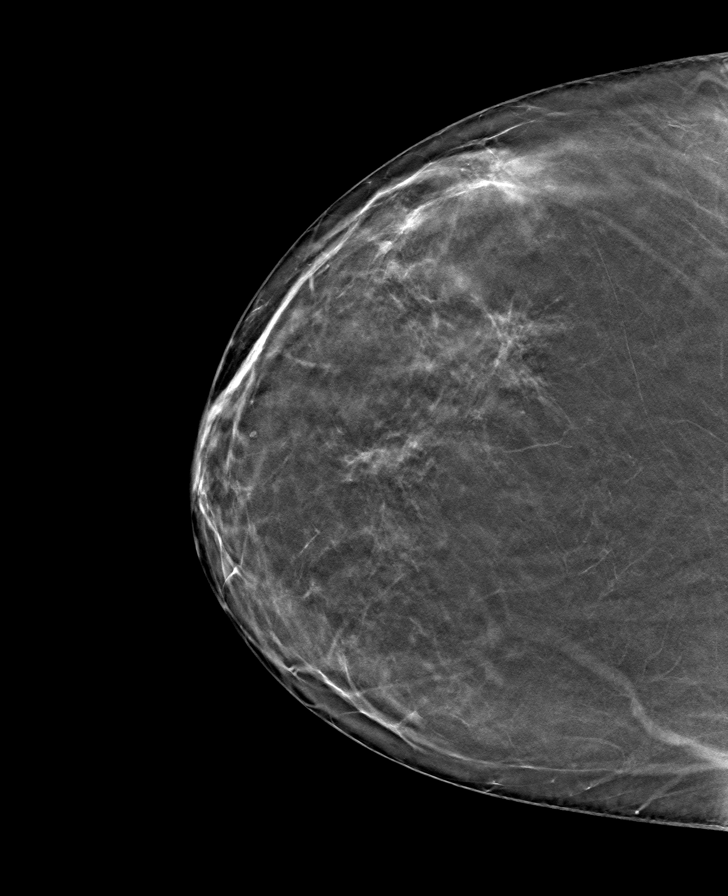

[R MLO tomo · tomo slice 43/85.0]
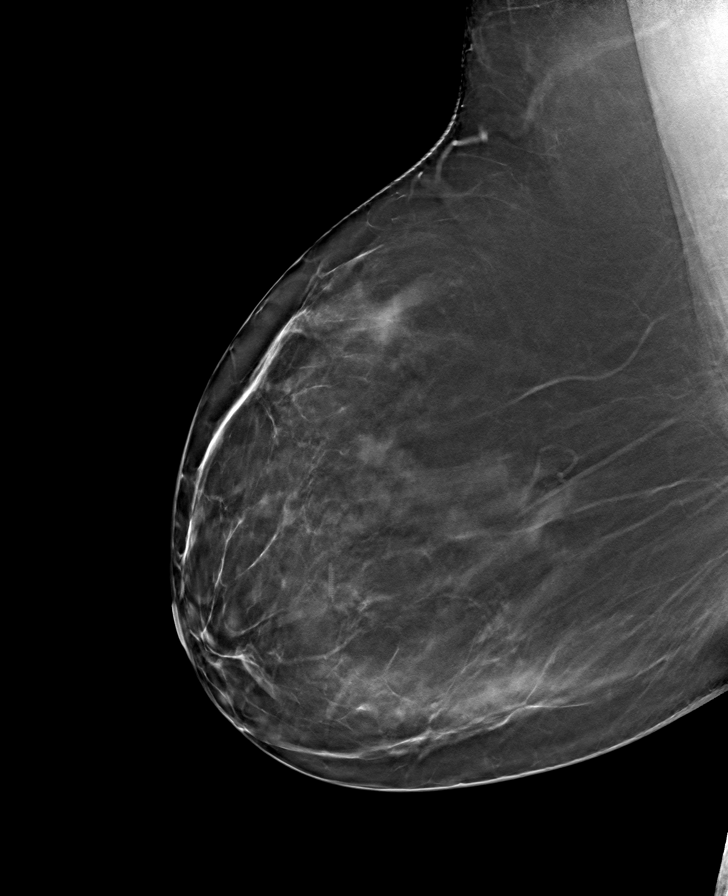

[8 of 24 positions shown; findings below may reference images not displayed]

ACR Breast Density Category b: There are scattered areas of
fibroglandular density.
FINDINGS: There are no findings suspicious for malignancy.
IMPRESSION: No mammographic evidence of malignancy. A result letter of this
screening mammogram will be mailed directly to the patient.

RECOMMENDATION:
Screening mammogram in one year. (Code:51-O-LD2)

BI-RADS CATEGORY  1: Negative.

## 2022-08-24 ENCOUNTER — Other Ambulatory Visit: Payer: Self-pay

## 2022-08-24 MED ORDER — ATORVASTATIN CALCIUM 20 MG PO TABS: 20.0000 mg | ORAL_TABLET | Freq: Every day | ORAL | 4 refills | Status: DC

## 2022-08-24 MED ORDER — PROPRANOLOL HCL 10 MG PO TABS: 10.0000 mg | ORAL_TABLET | Freq: Two times a day (BID) | ORAL | 4 refills | Status: DC

## 2022-08-24 NOTE — Telephone Encounter (Signed)
//  Medication refill for Atorvastatin 20mg , Propranolol 10mg  last ov 03/21/22 upcoming ov 09/21/22 . Please advise

## 2022-08-27 ENCOUNTER — Ambulatory Visit: Payer: BC Managed Care – PPO | Admitting: Dermatology

## 2022-08-29 ENCOUNTER — Other Ambulatory Visit: Payer: Self-pay

## 2022-08-29 MED ORDER — LEVOTHYROXINE SODIUM 137 MCG PO TABS
137.0000 ug | ORAL_TABLET | Freq: Every day | ORAL | 4 refills | Status: DC
Start: 2022-08-29 — End: 2022-09-22

## 2022-08-29 NOTE — Telephone Encounter (Signed)
Medication refill for Levothyroxine last ov 04/10/22, upcoming ov 09/21/22 . Please advise

## 2022-09-06 ENCOUNTER — Ambulatory Visit: Payer: BC Managed Care – PPO | Admitting: Dermatology

## 2022-09-13 ENCOUNTER — Ambulatory Visit: Payer: BC Managed Care – PPO | Admitting: Dermatology

## 2022-09-17 DIAGNOSIS — E559 Vitamin D deficiency, unspecified: Secondary | ICD-10-CM | POA: Insufficient documentation

## 2022-09-17 NOTE — Patient Instructions (Signed)

## 2022-09-21 ENCOUNTER — Ambulatory Visit (INDEPENDENT_AMBULATORY_CARE_PROVIDER_SITE_OTHER): Payer: BC Managed Care – PPO | Admitting: Nurse Practitioner

## 2022-09-21 ENCOUNTER — Encounter: Payer: Self-pay | Admitting: Nurse Practitioner

## 2022-09-21 VITALS — BP 128/79 | HR 72 | Temp 97.9°F | Ht 65.98 in | Wt 190.5 lb

## 2022-09-21 DIAGNOSIS — E063 Autoimmune thyroiditis: Secondary | ICD-10-CM | POA: Diagnosis not present

## 2022-09-21 DIAGNOSIS — Z23 Encounter for immunization: Secondary | ICD-10-CM

## 2022-09-21 DIAGNOSIS — E6609 Other obesity due to excess calories: Secondary | ICD-10-CM

## 2022-09-21 DIAGNOSIS — D869 Sarcoidosis, unspecified: Secondary | ICD-10-CM

## 2022-09-21 DIAGNOSIS — I10 Essential (primary) hypertension: Secondary | ICD-10-CM | POA: Diagnosis not present

## 2022-09-21 DIAGNOSIS — Z6832 Body mass index (BMI) 32.0-32.9, adult: Secondary | ICD-10-CM

## 2022-09-21 DIAGNOSIS — E782 Mixed hyperlipidemia: Secondary | ICD-10-CM

## 2022-09-21 DIAGNOSIS — E559 Vitamin D deficiency, unspecified: Secondary | ICD-10-CM | POA: Diagnosis not present

## 2022-09-21 NOTE — Assessment & Plan Note (Signed)
Chronic, stable.  Continue current medication regimen and adjust as needed.  TSH and Free T4 today in office.

## 2022-09-21 NOTE — Progress Notes (Signed)
BP 128/79   Pulse 72   Temp 97.9 F (36.6 C) (Oral)   Ht 5' 5.98" (1.676 m)   Wt 190 lb 8 oz (86.4 kg)   LMP  (LMP Unknown)   SpO2 99%   BMI 30.76 kg/m    Subjective:    Patient ID: Danielle Sanford, female    DOB: 1961/09/12, 62 y.o.   MRN: [id number]  HPI: Danielle Sanford is a 62 y.o. female  Chief Complaint  Patient presents with   Hypertension   Hyperlipidemia   Sarcoidosis   Thyroid   HYPERTENSION / HYPERLIPIDEMIA Continues on Lipitor and Propranolol.   Satisfied with current treatment? no Duration of hypertension: years BP monitoring frequency: rarely BP range:  BP medication side effects: no Duration of hyperlipidemia: years Cholesterol medication side effects: no Cholesterol supplements: none Medication compliance: excellent compliance Aspirin: no Recent stressors: no Recurrent headaches: no Visual changes: no Palpitations: no Dyspnea: no Chest pain: no Lower extremity edema: no Dizzy/lightheaded: no  The ASCVD Risk score (Arnett DK, et al., 2019) failed to calculate for the following reasons:   The valid total cholesterol range is 130 to 320 mg/dL  HYPOTHYROIDISM Continues on Levothyroxine 137 MCG. Thyroid control status:stable Satisfied with current treatment? no Medication side effects: no Medication compliance: excellent compliance Etiology of hypothyroidism:  Recent dose adjustment:no Fatigue: no Cold intolerance: no Heat intolerance: no Weight gain: no Weight loss: no Constipation: no Diarrhea/loose stools: no Palpitations: no Lower extremity edema: no Anxiety/depressed mood: no    SARCOIDOSIS OF SKIN: Continues Plaquenil for sarcoidosis.  Was followed by [facility] rheumatology, but has not seen them since 05/17/2016.  Follows with dermatology, last saw 02/20/22 - visits with her once a year.  Last platelet count 184.   History of skin cancer: no History of precancerous skin lesions: no Family history of skin cancer: no       09/21/2022    1:51 PM 10/24/2020    9:33 AM 08/23/2020   11:30 AM 01/27/2020   10:12 AM 09/11/2017    7:57 AM  Depression screen PHQ 2/9  Decreased Interest 0 0 0 0 0  Down, Depressed, Hopeless 0 0 0 0 0  PHQ - 2 Score 0 0 0 0 0  Altered sleeping 0 0     Tired, decreased energy 0 0     Change in appetite 0 0     Feeling bad or failure about yourself  0 0     Trouble concentrating 0 0     Moving slowly or fidgety/restless 0 0     Suicidal thoughts 0 0     PHQ-9 Score 0 0     Difficult doing work/chores Not difficult at all           09/21/2022    1:51 PM  GAD 7 : Generalized Anxiety Score  Nervous, Anxious, on Edge 0  Control/stop worrying 0  Worry too much - different things 0  Trouble relaxing 0  Restless 0  Easily annoyed or irritable 0  Afraid - awful might happen 0  Total GAD 7 Score 0  Anxiety Difficulty Not difficult at all   Relevant past medical, surgical, family and social history reviewed and updated as indicated. Interim medical history since our last visit reviewed. Allergies and medications reviewed and updated.  Review of Systems  Constitutional:  Negative for activity change, appetite change, diaphoresis, fatigue and fever.  Respiratory:  Negative for cough, chest tightness and shortness of breath.  Cardiovascular:  Negative for chest pain, palpitations and leg swelling.  Gastrointestinal: Negative.   Neurological: Negative.   Psychiatric/Behavioral: Negative.      Per HPI unless specifically indicated above     Objective:    BP 128/79   Pulse 72   Temp 97.9 F (36.6 C) (Oral)   Ht 5' 5.98" (1.676 m)   Wt 190 lb 8 oz (86.4 kg)   LMP  (LMP Unknown)   SpO2 99%   BMI 30.76 kg/m   Wt Readings from Last 3 Encounters:  09/21/22 190 lb 8 oz (86.4 kg)  03/21/22 200 lb 6.4 oz (90.9 kg)  08/22/21 200 lb 6.4 oz (90.9 kg)    Physical Exam Vitals and nursing note reviewed.  Constitutional:      General: She is awake. She is not in acute distress.     Appearance: She is well-developed and well-groomed. She is obese. She is not ill-appearing.  HENT:     Head: Normocephalic.     Right Ear: Hearing normal.     Left Ear: Hearing normal.  Eyes:     General: Lids are normal.        Right eye: No discharge.        Left eye: No discharge.     Conjunctiva/sclera: Conjunctivae normal.     Pupils: Pupils are equal, round, and reactive to light.  Cardiovascular:     Rate and Rhythm: Normal rate and regular rhythm.     Heart sounds: Normal heart sounds. No murmur heard.    No gallop.  Pulmonary:     Effort: Pulmonary effort is normal. No accessory muscle usage or respiratory distress.     Breath sounds: Normal breath sounds.  Abdominal:     General: Bowel sounds are normal.     Palpations: Abdomen is soft.  Musculoskeletal:     Cervical back: Normal range of motion and neck supple.     Right lower leg: No edema.     Left lower leg: No edema.  Skin:    General: Skin is warm and dry.  Neurological:     Mental Status: She is alert and oriented to person, place, and time.  Psychiatric:        Attention and Perception: Attention normal.        Mood and Affect: Mood normal.        Speech: Speech normal.        Behavior: Behavior normal. Behavior is cooperative.        Thought Content: Thought content normal.    Results for orders placed or performed in visit on 03/21/22  T4, free  Result Value Ref Range   Free T4 1.25 0.82 - 1.77 ng/dL  TSH  Result Value Ref Range   TSH 6.160 (H) 0.450 - 4.500 uIU/mL  Comprehensive metabolic panel  Result Value Ref Range   Glucose 84 70 - 99 mg/dL   BUN 7 (L) 8 - 27 mg/dL   Creatinine, Ser 0.84 0.57 - 1.00 mg/dL   eGFR 79 >59 mL/min/1.73   BUN/Creatinine Ratio 8 (L) 12 - 28   Sodium 140 134 - 144 mmol/L   Potassium 4.1 3.5 - 5.2 mmol/L   Chloride 104 96 - 106 mmol/L   CO2 26 20 - 29 mmol/L   Calcium 9.4 8.7 - 10.3 mg/dL   Total Protein 6.8 6.0 - 8.5 g/dL   Albumin 3.8 3.8 - 4.8 g/dL    Globulin, Total 3.0 1.5 - 4.5 g/dL  Albumin/Globulin Ratio 1.3 1.2 - 2.2   Bilirubin Total 0.5 0.0 - 1.2 mg/dL   Alkaline Phosphatase 109 44 - 121 IU/L   AST 20 0 - 40 IU/L   ALT 15 0 - 32 IU/L  CBC with Differential/Platelet  Result Value Ref Range   WBC 4.1 3.4 - 10.8 x10E3/uL   RBC 4.68 3.77 - 5.28 x10E6/uL   Hemoglobin 11.9 11.1 - 15.9 g/dL   Hematocrit 36.8 34.0 - 46.6 %   MCV 79 79 - 97 fL   MCH 25.4 (L) 26.6 - 33.0 pg   MCHC 32.3 31.5 - 35.7 g/dL   RDW 14.2 11.7 - 15.4 %   Platelets 184 150 - 450 x10E3/uL   Neutrophils 69 Not Estab. %   Lymphs 15 Not Estab. %   Monocytes 11 Not Estab. %   Eos 4 Not Estab. %   Basos 1 Not Estab. %   Neutrophils Absolute 2.8 1.4 - 7.0 x10E3/uL   Lymphocytes Absolute 0.6 (L) 0.7 - 3.1 x10E3/uL   Monocytes Absolute 0.5 0.1 - 0.9 x10E3/uL   EOS (ABSOLUTE) 0.1 0.0 - 0.4 x10E3/uL   Basophils Absolute 0.0 0.0 - 0.2 x10E3/uL   Immature Granulocytes 0 Not Estab. %   Immature Grans (Abs) 0.0 0.0 - 0.1 x10E3/uL  Lipid Panel w/o Chol/HDL Ratio  Result Value Ref Range   Cholesterol, Total 125 100 - 199 mg/dL   Triglycerides 127 0 - 149 mg/dL   HDL 37 (L) >39 mg/dL   VLDL Cholesterol Cal 23 5 - 40 mg/dL   LDL Chol Calc (NIH) 65 0 - 99 mg/dL  VITAMIN D 25 Hydroxy (Vit-D Deficiency, Fractures)  Result Value Ref Range   Vit D, 25-Hydroxy 13.4 (L) 30.0 - 100.0 ng/mL      Assessment & Plan:   Problem List Items Addressed This Visit       Cardiovascular and Mediastinum   Hypertension - Primary    Chronic, stable.  BP at goal in office today.  Recommend she monitor BP at least a few mornings a week at home and document.  DASH diet at home.  Continue current medication regimen and adjust as needed.  Labs today: CMP.  Return in 6 months.       Relevant Orders   Comprehensive metabolic panel     Endocrine   Hashimoto's thyroiditis    Chronic, stable.  Continue current medication regimen and adjust as needed.  TSH and Free T4 today in office.       Relevant Orders   T4, free   TSH     Other   Hyperlipidemia    Chronic, stable.  Continue current medication regimen and adjust as needed.  Lipid panel today.      Relevant Orders   Comprehensive metabolic panel   Lipid Panel w/o Chol/HDL Ratio   Obesity    BMI 30.76.  Recommended eating smaller high protein, low fat meals more frequently and exercising 30 mins a day 5 times a week with a goal of 10-15lb weight loss in the next 3 months. Patient voiced their understanding and motivation to adhere to these recommendations.       Sarcoid    Followed by dermatology.  Continue regimen as prescribed by them, Plaquenil, sees them this afternoon.  Reviewed recent note.  Labs today: CMP.      Vitamin D deficiency    Chronic, ongoing.  Continue supplement and adjust as needed.  Check Vit D.  Relevant Orders   VITAMIN D 25 Hydroxy (Vit-D Deficiency, Fractures)   Other Visit Diagnoses     Flu vaccine need       Flu vaccine in office today.   Relevant Orders   Flu Vaccine QUAD 6+ mos PF IM (Fluarix Quad PF)        Follow up plan: Return in about 6 months (around 03/22/2023) for HTN/HLD, HYPOTHYROID, SARCOIDOSIS.

## 2022-09-21 NOTE — Assessment & Plan Note (Signed)
Chronic, stable.  Continue current medication regimen and adjust as needed.  Lipid panel today. 

## 2022-09-21 NOTE — Assessment & Plan Note (Signed)
Chronic, ongoing.  Continue supplement and adjust as needed.  Check Vit D.

## 2022-09-21 NOTE — Assessment & Plan Note (Addendum)
Chronic, stable.  BP at goal in office today.  Recommend she monitor BP at least a few mornings a week at home and document.  DASH diet at home.  Continue current medication regimen and adjust as needed.  Labs today: CMP.  Return in 6 months.  

## 2022-09-21 NOTE — Assessment & Plan Note (Signed)
BMI 30.76.  Recommended eating smaller high protein, low fat meals more frequently and exercising 30 mins a day 5 times a week with a goal of 10-15lb weight loss in the next 3 months. Patient voiced their understanding and motivation to adhere to these recommendations.

## 2022-09-21 NOTE — Assessment & Plan Note (Signed)
Followed by dermatology.  Continue regimen as prescribed by them, Plaquenil, sees them this afternoon.  Reviewed recent note.  Labs today: CMP.

## 2022-09-22 ENCOUNTER — Other Ambulatory Visit: Payer: Self-pay | Admitting: Nurse Practitioner

## 2022-09-22 DIAGNOSIS — E063 Autoimmune thyroiditis: Secondary | ICD-10-CM

## 2022-09-22 LAB — COMPREHENSIVE METABOLIC PANEL
ALT: 15 IU/L (ref 0–32)
AST: 19 IU/L (ref 0–40)
Albumin/Globulin Ratio: 1.2 (ref 1.2–2.2)
Albumin: 3.8 g/dL — ABNORMAL LOW (ref 3.9–4.9)
Alkaline Phosphatase: 119 IU/L (ref 44–121)
BUN/Creatinine Ratio: 6 — ABNORMAL LOW (ref 12–28)
BUN: 5 mg/dL — ABNORMAL LOW (ref 8–27)
Bilirubin Total: 0.6 mg/dL (ref 0.0–1.2)
CO2: 25 mmol/L (ref 20–29)
Calcium: 9.2 mg/dL (ref 8.7–10.3)
Chloride: 99 mmol/L (ref 96–106)
Creatinine, Ser: 0.79 mg/dL (ref 0.57–1.00)
Globulin, Total: 3.1 g/dL (ref 1.5–4.5)
Glucose: 67 mg/dL — ABNORMAL LOW (ref 70–99)
Potassium: 4 mmol/L (ref 3.5–5.2)
Sodium: 138 mmol/L (ref 134–144)
Total Protein: 6.9 g/dL (ref 6.0–8.5)
eGFR: 85 mL/min/{1.73_m2} (ref >59)

## 2022-09-22 LAB — VITAMIN D 25 HYDROXY (VIT D DEFICIENCY, FRACTURES): Vit D, 25-Hydroxy: 12.4 ng/mL — ABNORMAL LOW (ref 30.0–100.0)

## 2022-09-22 LAB — LIPID PANEL W/O CHOL/HDL RATIO
Cholesterol, Total: 129 mg/dL (ref 100–199)
HDL: 41 mg/dL (ref >39)
LDL Chol Calc (NIH): 67 mg/dL (ref 0–99)
Triglycerides: 117 mg/dL (ref 0–149)
VLDL Cholesterol Cal: 21 mg/dL (ref 5–40)

## 2022-09-22 LAB — TSH: TSH: 11.3 u[IU]/mL — ABNORMAL HIGH (ref 0.450–4.500)

## 2022-09-22 LAB — T4, FREE: Free T4: 1.25 ng/dL (ref 0.82–1.77)

## 2022-09-22 MED ORDER — CHOLECALCIFEROL 1.25 MG (50000 UT) PO TABS: 1.0000 | ORAL_TABLET | ORAL | 4 refills | Status: DC

## 2022-09-22 MED ORDER — LEVOTHYROXINE SODIUM 150 MCG PO TABS: 150.0000 ug | ORAL_TABLET | Freq: Every day | ORAL | 2 refills | Status: DC

## 2022-09-22 NOTE — Progress Notes (Signed)
Good morning, please let Stayce know we are making some changes based on labs: - Thyroid lab (TSH) is too high, meaning thyroid too sluggish and we need to increase Levothyroxine dosing.  I am sending in Levothyroxine 150 MCG to [facility] and when it arrives she is to stop 137 MCG dosing.  Ensure you take Levothyroxine first thing in the morning 30 minutes before food and other medications.  We will recheck labs only in 6 weeks -- please schedule lab only visit for her. - Vitamin D level remains too low, please ensure you are taking the weekly Vitamin D supplement I sent in. - Remainder of labs stable, no other medication changes needed.  Any questions? Please ensure to attend your lab visit in 6 weeks.  Any questions? Keep being amazing!!  Thank you for allowing me to participate in your care.  I appreciate you. Kindest regards, Azarie Coriz

## 2022-10-09 DIAGNOSIS — H02054 Trichiasis without entropian left upper eyelid: Secondary | ICD-10-CM | POA: Diagnosis not present

## 2022-10-15 DIAGNOSIS — Z79899 Other long term (current) drug therapy: Secondary | ICD-10-CM | POA: Diagnosis not present

## 2022-10-18 DIAGNOSIS — H2513 Age-related nuclear cataract, bilateral: Secondary | ICD-10-CM | POA: Diagnosis not present

## 2022-10-18 DIAGNOSIS — H02054 Trichiasis without entropian left upper eyelid: Secondary | ICD-10-CM | POA: Diagnosis not present

## 2022-10-18 DIAGNOSIS — D869 Sarcoidosis, unspecified: Secondary | ICD-10-CM | POA: Diagnosis not present

## 2022-10-18 DIAGNOSIS — Z79899 Other long term (current) drug therapy: Secondary | ICD-10-CM | POA: Diagnosis not present

## 2022-11-06 ENCOUNTER — Other Ambulatory Visit: Payer: BC Managed Care – PPO

## 2022-11-07 ENCOUNTER — Telehealth: Payer: Self-pay | Admitting: Nurse Practitioner

## 2022-11-07 NOTE — Telephone Encounter (Signed)
Called the pt to see if we could get her r/s'd no answer lmom for the pt to call the office to get r/s'd

## 2022-11-07 NOTE — Telephone Encounter (Signed)
-----   Message from Venita Lick, NP sent at 11/07/2022  8:13 AM EST ----- She is not scheduled for thyroid repeat labs and I had wanted those. Can we check on this and schedule.

## 2022-11-28 DIAGNOSIS — L905 Scar conditions and fibrosis of skin: Secondary | ICD-10-CM | POA: Diagnosis not present

## 2022-11-28 DIAGNOSIS — J3489 Other specified disorders of nose and nasal sinuses: Secondary | ICD-10-CM | POA: Diagnosis not present

## 2022-11-28 DIAGNOSIS — R0981 Nasal congestion: Secondary | ICD-10-CM | POA: Diagnosis not present

## 2022-11-28 DIAGNOSIS — J329 Chronic sinusitis, unspecified: Secondary | ICD-10-CM | POA: Diagnosis not present

## 2022-12-25 ENCOUNTER — Other Ambulatory Visit: Payer: Self-pay | Admitting: Nurse Practitioner

## 2022-12-25 ENCOUNTER — Other Ambulatory Visit: Payer: Self-pay | Admitting: Dermatology

## 2022-12-25 DIAGNOSIS — D863 Sarcoidosis of skin: Secondary | ICD-10-CM

## 2022-12-26 NOTE — Telephone Encounter (Signed)
Requested Prescriptions  Pending Prescriptions Disp Refills   levothyroxine (SYNTHROID) 150 MCG tablet [Pharmacy Med Name: L-THYROXINE (SYNTHROID) TABS 150MCG] 90 tablet 0    Sig: TAKE 1 TABLET DAILY     Endocrinology:  Hypothyroid Agents Failed - 12/25/2022 10:30 AM      Failed - TSH in normal range and within 360 days    TSH  Date Value Ref Range Status  09/21/2022 11.300 (H) 0.450 - 4.500 uIU/mL Final         Passed - Valid encounter within last 12 months    Recent Outpatient Visits           3 months ago Primary hypertension   [place] [facility] [place], Dunlap T, NP   9 months ago Primary hypertension   [place] [facility] [place], Corrie Dandy T, NP   1 year ago Primary hypertension   [place] [facility] [place], Corrie Dandy T, NP   1 year ago Sarcoid   [place] [facility] [place], Corrie Dandy T, NP   1 year ago Bilateral impacted cerumen   [place] [facility] Gerre Scull, NP       Future Appointments             In 1 month Willeen Niece, MD [facility]   In 2 months [place], Dorie Rank, NP [facility]

## 2023-01-01 DIAGNOSIS — H02013 Cicatricial entropion of right eye, unspecified eyelid: Secondary | ICD-10-CM | POA: Diagnosis not present

## 2023-01-01 DIAGNOSIS — H189 Unspecified disorder of cornea: Secondary | ICD-10-CM | POA: Diagnosis not present

## 2023-01-01 DIAGNOSIS — H02054 Trichiasis without entropian left upper eyelid: Secondary | ICD-10-CM | POA: Diagnosis not present

## 2023-02-05 ENCOUNTER — Ambulatory Visit: Payer: BC Managed Care – PPO | Admitting: Dermatology

## 2023-02-28 DIAGNOSIS — H02054 Trichiasis without entropian left upper eyelid: Secondary | ICD-10-CM | POA: Diagnosis not present

## 2023-03-01 ENCOUNTER — Other Ambulatory Visit: Payer: Self-pay | Admitting: Dermatology

## 2023-03-01 DIAGNOSIS — D863 Sarcoidosis of skin: Secondary | ICD-10-CM

## 2023-03-22 ENCOUNTER — Ambulatory Visit: Payer: BC Managed Care – PPO | Admitting: Nurse Practitioner

## 2023-03-22 DIAGNOSIS — D071 Carcinoma in situ of vulva: Secondary | ICD-10-CM

## 2023-03-22 DIAGNOSIS — I10 Essential (primary) hypertension: Secondary | ICD-10-CM

## 2023-03-22 DIAGNOSIS — E063 Autoimmune thyroiditis: Secondary | ICD-10-CM

## 2023-03-22 DIAGNOSIS — D869 Sarcoidosis, unspecified: Secondary | ICD-10-CM

## 2023-03-22 DIAGNOSIS — E6609 Other obesity due to excess calories: Secondary | ICD-10-CM

## 2023-03-22 DIAGNOSIS — E782 Mixed hyperlipidemia: Secondary | ICD-10-CM

## 2023-04-07 NOTE — Patient Instructions (Signed)

## 2023-04-09 ENCOUNTER — Encounter: Payer: Self-pay | Admitting: Nurse Practitioner

## 2023-04-09 ENCOUNTER — Ambulatory Visit: Payer: BC Managed Care – PPO | Admitting: Nurse Practitioner

## 2023-04-09 VITALS — BP 118/75 | HR 67 | Temp 97.9°F | Ht 65.98 in | Wt 172.4 lb

## 2023-04-09 DIAGNOSIS — I10 Essential (primary) hypertension: Secondary | ICD-10-CM | POA: Diagnosis not present

## 2023-04-09 DIAGNOSIS — E782 Mixed hyperlipidemia: Secondary | ICD-10-CM

## 2023-04-09 DIAGNOSIS — E063 Autoimmune thyroiditis: Secondary | ICD-10-CM

## 2023-04-09 DIAGNOSIS — D869 Sarcoidosis, unspecified: Secondary | ICD-10-CM

## 2023-04-09 DIAGNOSIS — Z1211 Encounter for screening for malignant neoplasm of colon: Secondary | ICD-10-CM

## 2023-04-09 DIAGNOSIS — E6609 Other obesity due to excess calories: Secondary | ICD-10-CM

## 2023-04-09 MED ORDER — LEVOTHYROXINE SODIUM 150 MCG PO TABS: 150.0000 ug | ORAL_TABLET | Freq: Every day | ORAL | 0 refills | Status: DC

## 2023-04-09 NOTE — Progress Notes (Signed)
BP 118/75   Pulse 67   Temp 97.9 F (36.6 C) (Oral)   Ht 5' 5.98" (1.676 m)   Wt 172 lb 6.4 oz (78.2 kg)   LMP  (LMP Unknown)   SpO2 97%   BMI 27.84 kg/m    Subjective:    Patient ID: Danielle Sanford, female    DOB: Sep 02, 1961, 62 y.o.   MRN: [id number]  HPI: Danielle Sanford is a 62 y.o. female  Chief Complaint  Patient presents with   Thyroid   Hypertension   HYPERTENSION / HYPERLIPIDEMIA Continues on Lipitor and Propranolol.   Satisfied with current treatment? no Duration of hypertension: years BP monitoring frequency: rarely BP range:  BP medication side effects: no Duration of hyperlipidemia: years Cholesterol medication side effects: no Cholesterol supplements: none Medication compliance: excellent compliance Aspirin: no Recent stressors: no Recurrent headaches: no Visual changes: no Palpitations: no Dyspnea: no Chest pain: no Lower extremity edema: no Dizzy/lightheaded: no  The ASCVD Risk score (Arnett DK, et al., 2019) failed to calculate for the following reasons:   The valid total cholesterol range is 130 to 320 mg/dL  HYPOTHYROIDISM Continues on Levothyroxine 150 MCG. Thyroid control status:stable Satisfied with current treatment? 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  Was followed by [facility] rheumatology, they last saw her on 05/17/2016 -- to return as needed.  Follows with dermatology, last saw 02/20/22 - visits with her once a year -- currently is scheduled for December with them.  Last platelet count 184.   History of skin cancer: no History of precancerous skin lesions: no Family history of  skin cancer: no      09/21/2022    1:51 PM 10/24/2020    9:33 AM 08/23/2020   11:30 AM 01/27/2020   10:12 AM 09/11/2017    7:57 AM  Depression screen PHQ 2/9  Decreased Interest 0 0 0 0 0  Down, Depressed, Hopeless 0 0 0 0 0  PHQ - 2 Score 0 0 0 0 0  Altered sleeping 0 0     Tired, decreased energy 0 0     Change in appetite 0 0     Feeling bad or failure about yourself  0 0     Trouble concentrating 0 0     Moving slowly or fidgety/restless 0 0     Suicidal thoughts 0 0     PHQ-9 Score 0 0     Difficult doing work/chores Not difficult at all           09/21/2022    1:51 PM  GAD 7 : Generalized Anxiety Score  Nervous, Anxious, on Edge 0  Control/stop worrying 0  Worry too much - different things 0  Trouble relaxing 0  Restless 0  Easily annoyed or irritable 0  Afraid - awful might happen 0  Total GAD 7 Score 0  Anxiety Difficulty Not difficult at all   Relevant past medical, surgical, family and social history reviewed and updated as indicated. Interim medical history since our last visit reviewed. Allergies and medications reviewed and updated.  Review of Systems  Constitutional:  Negative for activity change, appetite change, diaphoresis, fatigue and fever.  Respiratory:  Negative for cough, chest  tightness and shortness of breath.   Cardiovascular:  Negative for chest pain, palpitations and leg swelling.  Gastrointestinal: Negative.   Neurological: Negative.   Psychiatric/Behavioral: Negative.      Per HPI unless specifically indicated above     Objective:    BP 118/75   Pulse 67   Temp 97.9 F (36.6 C) (Oral)   Ht 5' 5.98" (1.676 m)   Wt 172 lb 6.4 oz (78.2 kg)   LMP  (LMP Unknown)   SpO2 97%   BMI 27.84 kg/m   Wt Readings from Last 3 Encounters:  04/09/23 172 lb 6.4 oz (78.2 kg)  09/21/22 190 lb 8 oz (86.4 kg)  03/21/22 200 lb 6.4 oz (90.9 kg)    Physical Exam Vitals and nursing note reviewed.  Constitutional:      General: She is awake. She is not  in acute distress.    Appearance: She is well-developed and well-groomed. She is obese. She is not ill-appearing.  HENT:     Head: Normocephalic.     Right Ear: Hearing normal.     Left Ear: Hearing normal.  Eyes:     General: Lids are normal.        Right eye: No discharge.        Left eye: No discharge.     Conjunctiva/sclera: Conjunctivae normal.     Pupils: Pupils are equal, round, and reactive to light.  Cardiovascular:     Rate and Rhythm: Normal rate and regular rhythm.     Heart sounds: Normal heart sounds. No murmur heard.    No gallop.  Pulmonary:     Effort: Pulmonary effort is normal. No accessory muscle usage or respiratory distress.     Breath sounds: Normal breath sounds.  Abdominal:     General: Bowel sounds are normal.     Palpations: Abdomen is soft.  Musculoskeletal:     Cervical back: Normal range of motion and neck supple.     Right lower leg: No edema.     Left lower leg: No edema.  Skin:    General: Skin is warm and dry.  Neurological:     Mental Status: She is alert and oriented to person, place, and time.  Psychiatric:        Attention and Perception: Attention normal.        Mood and Affect: Mood normal.        Speech: Speech normal.        Behavior: Behavior normal. Behavior is cooperative.        Thought Content: Thought content normal.    Results for orders placed or performed in visit on 09/21/22  T4, free  Result Value Ref Range   Free T4 1.25 0.82 - 1.77 ng/dL  Comprehensive metabolic panel  Result Value Ref Range   Glucose 67 (L) 70 - 99 mg/dL   BUN 5 (L) 8 - 27 mg/dL   Creatinine, Ser 1.61 0.57 - 1.00 mg/dL   eGFR 85 >09 UE/AVW/0.98   BUN/Creatinine Ratio 6 (L) 12 - 28   Sodium 138 134 - 144 mmol/L   Potassium 4.0 3.5 - 5.2 mmol/L   Chloride 99 96 - 106 mmol/L   CO2 25 20 - 29 mmol/L   Calcium 9.2 8.7 - 10.3 mg/dL   Total Protein 6.9 6.0 - 8.5 g/dL   Albumin 3.8 (L) 3.9 - 4.9 g/dL   Globulin, Total 3.1 1.5 - 4.5 g/dL    Albumin/Globulin Ratio 1.2 1.2 -  2.2   Bilirubin Total 0.6 0.0 - 1.2 mg/dL   Alkaline Phosphatase 119 44 - 121 IU/L   AST 19 0 - 40 IU/L   ALT 15 0 - 32 IU/L  Lipid Panel w/o Chol/HDL Ratio  Result Value Ref Range   Cholesterol, Total 129 100 - 199 mg/dL   Triglycerides 161 0 - 149 mg/dL   HDL 41 >09 mg/dL   VLDL Cholesterol Cal 21 5 - 40 mg/dL   LDL Chol Calc (NIH) 67 0 - 99 mg/dL  TSH  Result Value Ref Range   TSH 11.300 (H) 0.450 - 4.500 uIU/mL  VITAMIN D 25 Hydroxy (Vit-D Deficiency, Fractures)  Result Value Ref Range   Vit D, 25-Hydroxy 12.4 (L) 30.0 - 100.0 ng/mL      Assessment & Plan:   Problem List Items Addressed This Visit       Cardiovascular and Mediastinum   Hypertension - Primary    Chronic, stable.  BP at goal in office today.  Recommend she monitor BP at least a few mornings a week at home and document.  DASH diet at home.  Continue current medication regimen and adjust as needed.  Labs today: CMP.  Return in 6 months.       Relevant Orders   Comprehensive metabolic panel     Endocrine   Hashimoto's thyroiditis    Chronic, stable.  Continue current medication regimen and adjust as needed.  TSH and Free T4 today in office.      Relevant Medications   levothyroxine (SYNTHROID) 150 MCG tablet   Other Relevant Orders   T4, free   TSH     Other   Hyperlipidemia    Chronic, stable.  Continue current medication regimen and adjust as needed.  Lipid panel today.      Relevant Orders   Lipid Panel w/o Chol/HDL Ratio   Comprehensive metabolic panel   Sarcoid    Followed by dermatology.  Continue regimen as prescribed by them, Plaquenil, sees them tnext in December.  Reviewed recent note.  Labs today: CMP and CBC.  Return to rheumatology as needed.      Relevant Orders   CBC with Differential/Platelet   Other Visit Diagnoses     Colon cancer screening       GI referral placed and discussed with patient.   Relevant Orders   Ambulatory referral to  Gastroenterology        Follow up plan: Return in about 6 months (around 10/10/2023) for HYPOTHYROID, SARCOIDOSIS, HTN/HLD.

## 2023-04-09 NOTE — Assessment & Plan Note (Addendum)
Followed by dermatology.  Continue regimen as prescribed by them, Plaquenil, sees them tnext in December.  Reviewed recent note.  Labs today: CMP and CBC.  Return to rheumatology as needed.

## 2023-04-09 NOTE — Assessment & Plan Note (Signed)
Chronic, stable.  BP at goal in office today.  Recommend she monitor BP at least a few mornings a week at home and document.  DASH diet at home.  Continue current medication regimen and adjust as needed.  Labs today: CMP.  Return in 6 months.  

## 2023-04-09 NOTE — Assessment & Plan Note (Signed)
Chronic, stable.  Continue current medication regimen and adjust as needed.  Lipid panel today. 

## 2023-04-09 NOTE — Assessment & Plan Note (Signed)
Chronic, stable.  Continue current medication regimen and adjust as needed.  TSH and Free T4 today in office.

## 2023-04-10 ENCOUNTER — Other Ambulatory Visit: Payer: Self-pay | Admitting: Nurse Practitioner

## 2023-04-10 DIAGNOSIS — E063 Autoimmune thyroiditis: Secondary | ICD-10-CM

## 2023-04-10 LAB — COMPREHENSIVE METABOLIC PANEL
ALT: 13 IU/L (ref 0–32)
AST: 18 IU/L (ref 0–40)
Albumin: 3.9 g/dL (ref 3.9–4.9)
Alkaline Phosphatase: 108 IU/L (ref 44–121)
BUN/Creatinine Ratio: 13 (ref 12–28)
BUN: 10 mg/dL (ref 8–27)
Bilirubin Total: 0.5 mg/dL (ref 0.0–1.2)
CO2: 25 mmol/L (ref 20–29)
Calcium: 9.3 mg/dL (ref 8.7–10.3)
Chloride: 105 mmol/L (ref 96–106)
Creatinine, Ser: 0.76 mg/dL (ref 0.57–1.00)
Globulin, Total: 2.7 g/dL (ref 1.5–4.5)
Glucose: 83 mg/dL (ref 70–99)
Potassium: 4.2 mmol/L (ref 3.5–5.2)
Sodium: 143 mmol/L (ref 134–144)
Total Protein: 6.6 g/dL (ref 6.0–8.5)
eGFR: 89 mL/min/{1.73_m2} (ref >59)

## 2023-04-10 LAB — CBC WITH DIFFERENTIAL/PLATELET
Basophils Absolute: 0 10*3/uL (ref 0.0–0.2)
Basos: 1 %
EOS (ABSOLUTE): 0.1 10*3/uL (ref 0.0–0.4)
Eos: 2 %
Hematocrit: 37.2 % (ref 34.0–46.6)
Hemoglobin: 11.8 g/dL (ref 11.1–15.9)
Immature Grans (Abs): 0 10*3/uL (ref 0.0–0.1)
Immature Granulocytes: 0 %
Lymphocytes Absolute: 0.4 10*3/uL — ABNORMAL LOW (ref 0.7–3.1)
Lymphs: 12 %
MCH: 25.8 pg — ABNORMAL LOW (ref 26.6–33.0)
MCHC: 31.7 g/dL (ref 31.5–35.7)
MCV: 81 fL (ref 79–97)
Monocytes Absolute: 0.3 10*3/uL (ref 0.1–0.9)
Monocytes: 8 %
Neutrophils Absolute: 2.9 10*3/uL (ref 1.4–7.0)
Neutrophils: 77 %
Platelets: 152 10*3/uL (ref 150–450)
RBC: 4.57 x10E6/uL (ref 3.77–5.28)
RDW: 13.6 % (ref 11.7–15.4)
WBC: 3.7 10*3/uL (ref 3.4–10.8)

## 2023-04-10 LAB — LIPID PANEL W/O CHOL/HDL RATIO
Cholesterol, Total: 122 mg/dL (ref 100–199)
HDL: 36 mg/dL — ABNORMAL LOW (ref >39)
LDL Chol Calc (NIH): 70 mg/dL (ref 0–99)
Triglycerides: 82 mg/dL (ref 0–149)
VLDL Cholesterol Cal: 16 mg/dL (ref 5–40)

## 2023-04-10 LAB — T4, FREE: Free T4: 2.07 ng/dL — ABNORMAL HIGH (ref 0.82–1.77)

## 2023-04-10 LAB — TSH: TSH: 0.051 u[IU]/mL — ABNORMAL LOW (ref 0.450–4.500)

## 2023-04-10 MED ORDER — LEVOTHYROXINE SODIUM 125 MCG PO TABS: 125.0000 ug | ORAL_TABLET | Freq: Every day | ORAL | 4 refills | Status: DC

## 2023-04-10 NOTE — Progress Notes (Signed)
Good afternoon, please let Danielle Sanford know her labs have returned and overall they are stable with exception of thyroid.  Her levels are too low right now, more hyperthyroid level and overactive.  We need to reduce her Levothyroxine to 125 MCG and stop 150 MCG dosing and recommend she pick up the 125 I sent in.  Take this every morning 30 minutes before meal and other medications.  We need an outpatient lab visit in 6 weeks to recheck levels and ensure no further changes needed.  Any questions? Keep being amazing!!  Thank you for allowing me to participate in your care.  I appreciate you. Kindest regards, Heath Badon

## 2023-04-16 ENCOUNTER — Telehealth: Payer: Self-pay

## 2023-04-16 NOTE — Telephone Encounter (Signed)
Patient calling requesting a refill of Plaquenil tablets for sarcoidosis, it has been over a year since patient was in the office,

## 2023-04-16 NOTE — Telephone Encounter (Signed)
Error

## 2023-04-23 ENCOUNTER — Other Ambulatory Visit: Payer: Self-pay

## 2023-04-23 DIAGNOSIS — D863 Sarcoidosis of skin: Secondary | ICD-10-CM

## 2023-04-23 MED ORDER — HYDROXYCHLOROQUINE SULFATE 200 MG PO TABS
200.0000 mg | ORAL_TABLET | Freq: Two times a day (BID) | ORAL | 0 refills | Status: DC
Start: 2023-04-23 — End: 2023-08-20

## 2023-04-23 NOTE — Telephone Encounter (Signed)
Left patient voicemail regarding information per Dr. Roseanne Reno and 1 RF sent in. aw

## 2023-05-22 ENCOUNTER — Other Ambulatory Visit: Payer: BC Managed Care – PPO

## 2023-07-19 ENCOUNTER — Telehealth: Payer: Self-pay | Admitting: Nurse Practitioner

## 2023-07-19 NOTE — Telephone Encounter (Signed)
Pt's daughter Trula Ore is calling in because she is concerned that pt is showing early signs of dementia and she would like to speak with someone about what the next steps would be regarding getting her seen about it. Trula Ore says pt does not want to go to the doctor's office, but Trula Ore thinks she needs to be seen. Trula Ore is seeking advice on how to handle the situation. Please follow up with Christina.

## 2023-07-22 NOTE — Telephone Encounter (Signed)
Called and informed daughter of what provider stated, she verbalized understanding.  Was very thankful for the follow up.

## 2023-07-22 NOTE — Telephone Encounter (Signed)
Called pt's daughter scheduled an appointment on November 12 @ 3:20 PM. Daughter is very concerned that her mother will not come to appointment and asked if we had any suggestions on getting her here.  States that if she refuses then she will call to cancel appointment.

## 2023-07-28 DIAGNOSIS — R413 Other amnesia: Secondary | ICD-10-CM | POA: Insufficient documentation

## 2023-07-28 NOTE — Patient Instructions (Signed)

## 2023-07-30 ENCOUNTER — Encounter: Payer: Self-pay | Admitting: Nurse Practitioner

## 2023-07-30 ENCOUNTER — Ambulatory Visit: Payer: BC Managed Care – PPO | Admitting: Nurse Practitioner

## 2023-07-30 VITALS — BP 125/74 | HR 77 | Temp 97.8°F | Ht 66.0 in | Wt 164.8 lb

## 2023-07-30 DIAGNOSIS — E782 Mixed hyperlipidemia: Secondary | ICD-10-CM

## 2023-07-30 DIAGNOSIS — R413 Other amnesia: Secondary | ICD-10-CM

## 2023-07-30 DIAGNOSIS — Z23 Encounter for immunization: Secondary | ICD-10-CM | POA: Diagnosis not present

## 2023-07-30 DIAGNOSIS — I10 Essential (primary) hypertension: Secondary | ICD-10-CM

## 2023-07-30 DIAGNOSIS — E063 Autoimmune thyroiditis: Secondary | ICD-10-CM | POA: Diagnosis not present

## 2023-07-30 LAB — URINALYSIS, ROUTINE W REFLEX MICROSCOPIC
Bilirubin, UA: NEGATIVE
Glucose, UA: NEGATIVE
Ketones, UA: NEGATIVE
Leukocytes,UA: NEGATIVE
Nitrite, UA: NEGATIVE
Protein,UA: NEGATIVE
RBC, UA: NEGATIVE
Specific Gravity, UA: 1.025 (ref 1.005–1.030)
Urobilinogen, Ur: 0.2 mg/dL (ref 0.2–1.0)
pH, UA: 6 (ref 5.0–7.5)

## 2023-07-30 NOTE — Assessment & Plan Note (Signed)
Chronic, stable.  Continue current medication regimen and adjust as needed.  TSH and Free T4 today.

## 2023-07-30 NOTE — Assessment & Plan Note (Signed)
Per family and some ongoing mood issues.  Will check labs today.  MMSE 28/30. Patient denies any concerns.

## 2023-07-30 NOTE — Progress Notes (Signed)
BP 125/74   Pulse 77   Temp 97.8 F (36.6 C) (Oral)   Ht 5\' 6"  (1.676 m)   Wt 164 lb 12.8 oz (74.8 kg)   LMP  (LMP Unknown)   SpO2 98%   BMI 26.60 kg/m    Subjective:    Patient ID: Danielle Sanford, female    DOB: 12-29-1960, 62 y.o.   MRN: [id number]  HPI: Danielle Sanford is a 62 y.o. female  Chief Complaint  Patient presents with   Memory Loss   HYPOTHYROIDISM Continues on Levothyroxine 125 MCG.   Her daughter Trula Ore) wrote to provider and was concerned about patient memory.  Concerned she is showing early stages of dementia.  She has told daughter she has heard voices and has to take her dog out at specific times or she thinks something bad is going to happen.  This is new for her.  Patient denies this, Thyroid control status:stable Satisfied with current treatment? yes Medication side effects: no Medication compliance: good compliance Etiology of hypothyroidism: Hashimoto's Recent dose adjustment:no Fatigue: no Cold intolerance: no Heat intolerance: no Weight gain: no Weight loss: no Constipation: no Diarrhea/loose stools: no Palpitations: no Lower extremity edema: no Anxiety/depressed mood: no     07/30/2023    3:22 PM  MMSE - Mini Mental State Exam  Orientation to time 5  Orientation to Place 5  Registration 3  Attention/ Calculation 4  Recall 2  Language- name 2 objects 2  Language- repeat 1  Language- follow 3 step command 3  Language- read & follow direction 1  Write a sentence 1  Copy design 1  Total score 28     Relevant past medical, surgical, family and social history reviewed and updated as indicated. Interim medical history since our last visit reviewed. Allergies and medications reviewed and updated.  Review of Systems  Constitutional:  Negative for activity change, appetite change, diaphoresis, fatigue and fever.  Respiratory:  Negative for cough, chest tightness and shortness of breath.   Cardiovascular:  Negative for  chest pain, palpitations and leg swelling.  Gastrointestinal: Negative.   Endocrine: Negative for cold intolerance and heat intolerance.  Neurological: Negative.   Psychiatric/Behavioral: Negative.      Per HPI unless specifically indicated above     Objective:    BP 125/74   Pulse 77   Temp 97.8 F (36.6 C) (Oral)   Ht 5\' 6"  (1.676 m)   Wt 164 lb 12.8 oz (74.8 kg)   LMP  (LMP Unknown)   SpO2 98%   BMI 26.60 kg/m   Wt Readings from Last 3 Encounters:  07/30/23 164 lb 12.8 oz (74.8 kg)  04/09/23 172 lb 6.4 oz (78.2 kg)  09/21/22 190 lb 8 oz (86.4 kg)    Physical Exam Vitals and nursing note reviewed.  Constitutional:      General: She is awake. She is not in acute distress.    Appearance: She is well-developed and well-groomed. She is not ill-appearing or toxic-appearing.  HENT:     Head: Normocephalic.     Right Ear: Hearing and external ear normal.     Left Ear: Hearing and external ear normal.  Eyes:     General: Lids are normal.        Right eye: No discharge.        Left eye: No discharge.     Conjunctiva/sclera: Conjunctivae normal.     Pupils: Pupils are equal, round, and reactive to light.  Neck:  Thyroid: No thyromegaly.     Vascular: No carotid bruit.  Cardiovascular:     Rate and Rhythm: Normal rate and regular rhythm.     Heart sounds: Normal heart sounds. No murmur heard.    No gallop.  Pulmonary:     Effort: Pulmonary effort is normal. No accessory muscle usage or respiratory distress.     Breath sounds: Normal breath sounds.  Abdominal:     General: Bowel sounds are normal. There is no distension.     Palpations: Abdomen is soft.     Tenderness: There is no abdominal tenderness.  Musculoskeletal:     Cervical back: Normal range of motion and neck supple.     Right lower leg: No edema.     Left lower leg: No edema.  Lymphadenopathy:     Cervical: No cervical adenopathy.  Skin:    General: Skin is warm and dry.  Neurological:     Mental  Status: She is alert and oriented to person, place, and time.     Deep Tendon Reflexes: Reflexes are normal and symmetric.     Reflex Scores:      Brachioradialis reflexes are 2+ on the right side and 2+ on the left side.      Patellar reflexes are 2+ on the right side and 2+ on the left side. Psychiatric:        Attention and Perception: Attention normal.        Mood and Affect: Mood normal.        Speech: Speech normal.        Behavior: Behavior normal. Behavior is cooperative.        Thought Content: Thought content normal.     Results for orders placed or performed in visit on 04/09/23  T4, free  Result Value Ref Range   Free T4 2.07 (H) 0.82 - 1.77 ng/dL  TSH  Result Value Ref Range   TSH 0.051 (L) 0.450 - 4.500 uIU/mL  Lipid Panel w/o Chol/HDL Ratio  Result Value Ref Range   Cholesterol, Total 122 100 - 199 mg/dL   Triglycerides 82 0 - 149 mg/dL   HDL 36 (L) >24 mg/dL   VLDL Cholesterol Cal 16 5 - 40 mg/dL   LDL Chol Calc (NIH) 70 0 - 99 mg/dL  Comprehensive metabolic panel  Result Value Ref Range   Glucose 83 70 - 99 mg/dL   BUN 10 8 - 27 mg/dL   Creatinine, Ser 4.01 0.57 - 1.00 mg/dL   eGFR 89 >02 VO/ZDG/6.44   BUN/Creatinine Ratio 13 12 - 28   Sodium 143 134 - 144 mmol/L   Potassium 4.2 3.5 - 5.2 mmol/L   Chloride 105 96 - 106 mmol/L   CO2 25 20 - 29 mmol/L   Calcium 9.3 8.7 - 10.3 mg/dL   Total Protein 6.6 6.0 - 8.5 g/dL   Albumin 3.9 3.9 - 4.9 g/dL   Globulin, Total 2.7 1.5 - 4.5 g/dL   Bilirubin Total 0.5 0.0 - 1.2 mg/dL   Alkaline Phosphatase 108 44 - 121 IU/L   AST 18 0 - 40 IU/L   ALT 13 0 - 32 IU/L  CBC with Differential/Platelet  Result Value Ref Range   WBC 3.7 3.4 - 10.8 x10E3/uL   RBC 4.57 3.77 - 5.28 x10E6/uL   Hemoglobin 11.8 11.1 - 15.9 g/dL   Hematocrit 03.4 74.2 - 46.6 %   MCV 81 79 - 97 fL   MCH 25.8 (L) 26.6 - 33.0  pg   MCHC 31.7 31.5 - 35.7 g/dL   RDW 16.1 09.6 - 04.5 %   Platelets 152 150 - 450 x10E3/uL   Neutrophils 77 Not Estab.  %   Lymphs 12 Not Estab. %   Monocytes 8 Not Estab. %   Eos 2 Not Estab. %   Basos 1 Not Estab. %   Neutrophils Absolute 2.9 1.4 - 7.0 x10E3/uL   Lymphocytes Absolute 0.4 (L) 0.7 - 3.1 x10E3/uL   Monocytes Absolute 0.3 0.1 - 0.9 x10E3/uL   EOS (ABSOLUTE) 0.1 0.0 - 0.4 x10E3/uL   Basophils Absolute 0.0 0.0 - 0.2 x10E3/uL   Immature Granulocytes 0 Not Estab. %   Immature Grans (Abs) 0.0 0.0 - 0.1 x10E3/uL      Assessment & Plan:   Problem List Items Addressed This Visit       Endocrine   Hashimoto's thyroiditis    Chronic, stable.  Continue current medication regimen and adjust as needed.  TSH and Free T4 today.      Relevant Orders   T4, free   TSH     Other   Memory changes - Primary    Per family and some ongoing mood issues.  Will check labs today.  MMSE 28/30. Patient denies any concerns.      Relevant Orders   TSH   Comprehensive metabolic panel   CBC with Differential/Platelet   Vitamin B12   RPR   Urinalysis, Routine w reflex microscopic   Other Visit Diagnoses     Flu vaccine need       Flu vaccine today, educated patient.   Relevant Orders   Flu vaccine trivalent PF, 6mos and older(Flulaval,Afluria,Fluarix,Fluzone) (Completed)        Follow up plan: Return for as scheduled in January 2025.

## 2023-07-31 ENCOUNTER — Other Ambulatory Visit: Payer: Self-pay

## 2023-07-31 LAB — COMPREHENSIVE METABOLIC PANEL
ALT: 10 [IU]/L (ref 0–32)
AST: 21 [IU]/L (ref 0–40)
Albumin: 4.1 g/dL (ref 3.9–4.9)
Alkaline Phosphatase: 143 [IU]/L — ABNORMAL HIGH (ref 44–121)
BUN/Creatinine Ratio: 9 — ABNORMAL LOW (ref 12–28)
BUN: 8 mg/dL (ref 8–27)
Bilirubin Total: 0.5 mg/dL (ref 0.0–1.2)
CO2: 25 mmol/L (ref 20–29)
Calcium: 9.5 mg/dL (ref 8.7–10.3)
Chloride: 99 mmol/L (ref 96–106)
Creatinine, Ser: 0.88 mg/dL (ref 0.57–1.00)
Globulin, Total: 3.4 g/dL (ref 1.5–4.5)
Glucose: 83 mg/dL (ref 70–99)
Potassium: 4.6 mmol/L (ref 3.5–5.2)
Sodium: 139 mmol/L (ref 134–144)
Total Protein: 7.5 g/dL (ref 6.0–8.5)
eGFR: 74 mL/min/{1.73_m2} (ref >59)

## 2023-07-31 LAB — CBC WITH DIFFERENTIAL/PLATELET
Basophils Absolute: 0 10*3/uL (ref 0.0–0.2)
Basos: 1 %
EOS (ABSOLUTE): 0.1 10*3/uL (ref 0.0–0.4)
Eos: 3 %
Hematocrit: 41.6 % (ref 34.0–46.6)
Hemoglobin: 13 g/dL (ref 11.1–15.9)
Immature Grans (Abs): 0 10*3/uL (ref 0.0–0.1)
Immature Granulocytes: 0 %
Lymphocytes Absolute: 0.5 10*3/uL — ABNORMAL LOW (ref 0.7–3.1)
Lymphs: 14 %
MCH: 25.7 pg — ABNORMAL LOW (ref 26.6–33.0)
MCHC: 31.3 g/dL — ABNORMAL LOW (ref 31.5–35.7)
MCV: 82 fL (ref 79–97)
Monocytes Absolute: 0.4 10*3/uL (ref 0.1–0.9)
Monocytes: 11 %
Neutrophils Absolute: 2.6 10*3/uL (ref 1.4–7.0)
Neutrophils: 71 %
Platelets: 202 10*3/uL (ref 150–450)
RBC: 5.05 x10E6/uL (ref 3.77–5.28)
RDW: 14.1 % (ref 11.7–15.4)
WBC: 3.7 10*3/uL (ref 3.4–10.8)

## 2023-07-31 LAB — VITAMIN B12: Vitamin B-12: 532 pg/mL (ref 232–1245)

## 2023-07-31 LAB — T4, FREE: Free T4: 1.5 ng/dL (ref 0.82–1.77)

## 2023-07-31 LAB — TSH: TSH: 3.72 u[IU]/mL (ref 0.450–4.500)

## 2023-07-31 LAB — RPR: RPR Ser Ql: NONREACTIVE

## 2023-07-31 MED ORDER — ATORVASTATIN CALCIUM 20 MG PO TABS: 20.0000 mg | ORAL_TABLET | Freq: Every day | ORAL | 4 refills | Status: DC

## 2023-07-31 NOTE — Progress Notes (Signed)
Good afternoon, please let Danielle Sanford know her labs have returned and overall remain stable.  No medication changes needed.  Any questions? Keep being amazing!!  Thank you for allowing me to participate in your care.  I appreciate you. Kindest regards, Cystal Shannahan

## 2023-08-20 ENCOUNTER — Other Ambulatory Visit: Payer: Self-pay | Admitting: Dermatology

## 2023-08-20 ENCOUNTER — Encounter: Payer: Self-pay | Admitting: Dermatology

## 2023-08-20 ENCOUNTER — Ambulatory Visit: Payer: BC Managed Care – PPO | Admitting: Dermatology

## 2023-08-20 DIAGNOSIS — D869 Sarcoidosis, unspecified: Secondary | ICD-10-CM

## 2023-08-20 DIAGNOSIS — L720 Epidermal cyst: Secondary | ICD-10-CM | POA: Diagnosis not present

## 2023-08-20 DIAGNOSIS — Z79899 Other long term (current) drug therapy: Secondary | ICD-10-CM

## 2023-08-20 DIAGNOSIS — D863 Sarcoidosis of skin: Secondary | ICD-10-CM

## 2023-08-20 DIAGNOSIS — L821 Other seborrheic keratosis: Secondary | ICD-10-CM | POA: Diagnosis not present

## 2023-08-20 DIAGNOSIS — L729 Follicular cyst of the skin and subcutaneous tissue, unspecified: Secondary | ICD-10-CM

## 2023-08-20 MED ORDER — MUPIROCIN 2 % EX OINT: TOPICAL_OINTMENT | CUTANEOUS | 3 refills | Status: AC

## 2023-08-20 MED ORDER — CLOBETASOL PROPIONATE 0.05 % EX CREA: TOPICAL_CREAM | CUTANEOUS | 3 refills | Status: DC

## 2023-08-20 MED ORDER — TACROLIMUS 0.1 % EX OINT: TOPICAL_OINTMENT | CUTANEOUS | 5 refills | Status: AC

## 2023-08-20 MED ORDER — HYDROXYCHLOROQUINE SULFATE 200 MG PO TABS: 200.0000 mg | ORAL_TABLET | Freq: Two times a day (BID) | ORAL | 5 refills | Status: DC

## 2023-08-20 NOTE — Patient Instructions (Addendum)
Start Mupirocin once or twice daily to any open sores until healed.   Continue Hydroxychloroquine 200 mg twice daily   Continue Tacrolimus ointment Apply twice daily too affected areas of face and body  Continue Clobetasol cream Apply twice daily to pink, active areas as needed only. Avoid applying to face, groin, and axilla. Use as directed. Long-term use can cause thinning of the skin.  Topical steroids (such as triamcinolone, fluocinolone, fluocinonide, mometasone, clobetasol, halobetasol, betamethasone, hydrocortisone) can cause thinning and lightening of the skin if they are used for too long in the same area. Your physician has selected the right strength medicine for your problem and area affected on the body. Please use your medication only as directed by your physician to prevent side effects.   Hydroxychloroquine (plaquenil) may cause sun sensitivity, so daily sun protection is recommended. It can rarely cause irreversible damage to the retina of the eye if used for a long period of time. This damage can be identified and the medication stopped before vision changes are noticed by going for a yearly ophthalmology exam.  Yearly eye exams are recommended if there are underlying risk factors for eye problems (i.e.diabetes), when higher dosage of medication per weight is used (>5mg /kg), and length of time on medication (>5 years). Rarely, this medicine can cause low blood counts, liver inflammation, low blood sugar, and/or a color change of the skin.  It should not be used together with certain medicines that can affect electrical conduction of the heart including macrolide antibiotics such as Azithromycin.  The use of Plaquenil requires long term medication management, including periodic office visits and monitoring of blood work.        Pre-Operative Instructions  You are scheduled for a surgical procedure at [facility]. We recommend you read the following instructions. If you  have any questions or concerns, please call the office at [phone].  Shower and wash the entire body with soap and water the day of your surgery paying special attention to cleansing at and around the planned surgery site.  Avoid aspirin or aspirin containing products at least fourteen (14) days prior to your surgical procedure and for at least one week (7 Days) after your surgical procedure. If you take aspirin on a regular basis for heart disease or history of stroke or for any other reason, we may recommend you continue taking aspirin but please notify us if you take this on a regular basis. Aspirin can cause more bleeding to occur during surgery as well as prolonged bleeding and bruising after surgery.   Avoid other nonsteroidal pain medications at least one week prior to surgery and at least one week prior to your surgery. These include medications such as Ibuprofen (Motrin, Advil and Nuprin), Naprosyn, Voltaren, Relafen, etc. If medications are used for therapeutic reasons, please inform us as they can cause increased bleeding or prolonged bleeding during and bruising after surgical procedures.   Please advise [place] if you are taking any "blood thinner" medications such as Coumadin or Dipyridamole or Plavix or similar medications. These cause increased bleeding and prolonged bleeding during procedures and bruising after surgical procedures. We may have to consider discontinuing these medications briefly prior to and shortly after your surgery if safe to do so.   Please inform us of all medications you are currently taking. All medications that are taken regularly should be taken the day of surgery as you always do. Nevertheless, we need to be informed of what medications you are taking prior  to surgery to know whether they will affect the procedure or cause any complications.   Please inform us of any medication allergies. Also inform us of whether you have allergies to Latex or rubber products or  whether you have had any adverse reaction to Lidocaine or Epinephrine.  Please inform us of any prosthetic or artificial body parts such as artificial heart valve, joint replacements, etc., or similar condition that might require preoperative antibiotics.   We recommend avoidance of alcohol at least two weeks prior to surgery and continued avoidance for at least two weeks after surgery.   We recommend discontinuation of tobacco smoking at least two weeks prior to surgery and continued abstinence for at least two weeks after surgery.  Do not plan strenuous exercise, strenuous work or strenuous lifting for approximately four weeks after your surgery.   We request if you are unable to make your scheduled surgical appointment, please call us at least a week in advance or as soon as you are aware of a problem so that we can cancel or reschedule the appointment.   You MAY TAKE TYLENOL (acetaminophen) for pain as it is not a blood thinner.   PLEASE PLAN TO BE IN TOWN FOR TWO WEEKS FOLLOWING SURGERY, THIS IS IMPORTANT SO YOU CAN BE CHECKED FOR DRESSING CHANGES, SUTURE REMOVAL AND TO MONITOR FOR POSSIBLE COMPLICATIONS.      Recommend daily broad spectrum sunscreen SPF 30+ to sun-exposed areas, reapply every 2 hours as needed. Call for new or changing lesions.  Staying in the shade or wearing long sleeves, sun glasses (UVA+UVB protection) and wide brim hats (4-inch brim around the entire circumference of the hat) are also recommended for sun protection.     Due to recent changes in healthcare laws, you may see results of your pathology and/or laboratory studies on MyChart before the doctors have had a chance to review them. We understand that in some cases there may be results that are confusing or concerning to you. Please understand that not all results are received at the same time and often the doctors may need to interpret multiple results in order to provide you with the best plan of care or  course of treatment. Therefore, we ask that you please give [place] 2 business days to thoroughly review all your results before contacting the office for clarification. Should we see a critical lab result, you will be contacted sooner.   If You Need Anything After Your Visit  If you have any questions or concerns for your doctor, please call our main line at [phone] and press option 4 to reach your doctor's medical assistant. If no one answers, please leave a voicemail as directed and we will return your call as soon as possible. Messages left after 4 pm will be answered the following business day.   You may also send [place] a message via MyChart. We typically respond to MyChart messages within 1-2 business days.  For prescription refills, please ask your pharmacy to contact our office. Our fax number is [phone].  If you have an urgent issue when the clinic is closed that cannot wait until the next business day, you can page your doctor at the number below.    Please note that while we do our best to be available for urgent issues outside of office hours, we are not available 24/7.   If you have an urgent issue and are unable to reach [place], you may choose to seek medical care at your  doctor's office, retail clinic, urgent care center, or emergency room.  If you have a medical emergency, please immediately call 911 or go to the emergency department.  Pager Numbers  - Dr. Gwen Pounds: [phone]  - Dr. Roseanne Reno: [phone]  - Dr. Katrinka Blazing: [phone]   In the event of inclement weather, please call our main line at [phone] for an update on the status of any delays or closures.  Dermatology Medication Tips: Please keep the boxes that topical medications come in in order to help keep track of the instructions about where and how to use these. Pharmacies typically print the medication instructions only on the boxes and not directly on the medication tubes.   If your medication is too  expensive, please contact our office at [phone] option 4 or send [place] a message through MyChart.   We are unable to tell what your co-pay for medications will be in advance as this is different depending on your insurance coverage. However, we may be able to find a substitute medication at lower cost or fill out paperwork to get insurance to cover a needed medication.   If a prior authorization is required to get your medication covered by your insurance company, please allow [place] 1-2 business days to complete this process.  Drug prices often vary depending on where the prescription is filled and some pharmacies may offer cheaper prices.  The website www.goodrx.com contains coupons for medications through different pharmacies. The prices here do not account for what the cost may be with help from insurance (it may be cheaper with your insurance), but the website can give you the price if you did not use any insurance.  - You can print the associated coupon and take it with your prescription to the pharmacy.  - You may also stop by our office during regular business hours and pick up a GoodRx coupon card.  - If you need your prescription sent electronically to a different pharmacy, notify our office through [facility] or by phone at [phone] option 4.     Si Usted Necesita Algo Despus de Su Visita  Tambin puede enviarnos un mensaje a travs de Clinical cytogeneticist. Por lo general respondemos a los mensajes de MyChart en el transcurso de 1 a 2 das hbiles.  Para renovar recetas, por favor pida a su farmacia que se ponga en contacto con nuestra oficina. Annie Sable de fax es [place] [phone].  Si tiene un asunto urgente cuando la clnica est cerrada y que no puede esperar hasta el siguiente da hbil, puede llamar/localizar a su doctor(a) al nmero que aparece a continuacin.   Por favor, tenga en cuenta que aunque hacemos todo lo posible para estar disponibles para asuntos urgentes fuera  del horario de [place], no estamos disponibles las 24 horas del da, los 7 [address]  [address] de la [place].   Si tiene un problema urgente y no puede comunicarse con nosotros, puede optar por buscar atencin mdica  en el consultorio de su doctor(a), en una clnica privada, en un centro de atencin urgente o en una sala de emergencias.  Si tiene Engineer, drilling, por favor llame inmediatamente al 911 o vaya a la sala de emergencias.  Nmeros de bper  - Dr. Gwen Pounds: [phone]  - Dra. Roseanne Reno: [phone]  - Dr. Katrinka Blazing: [phone]   En caso de inclemencias del tiempo, por favor llame a Lacy Duverney principal al [phone] para una actualizacin sobre el [place] de cualquier retraso o cierre.  Consejos para la medicacin en  dermatologa: Por favor, guarde las cajas en las que vienen los medicamentos de uso tpico para ayudarle a seguir las instrucciones sobre dnde y cmo usarlos. Las farmacias generalmente imprimen las instrucciones del medicamento slo en las cajas y no directamente en los tubos del [place].   Si su medicamento es muy caro, por favor, pngase en contacto con Rolm Gala llamando al [phone] y presione la opcin 4 o envenos un mensaje a travs de Clinical cytogeneticist.   No podemos decirle cul ser su copago por los medicamentos por adelantado ya que esto es diferente dependiendo de la cobertura de su seguro. Sin embargo, es posible que podamos encontrar un medicamento sustituto a Audiological scientist un formulario para que el seguro cubra el medicamento que se considera necesario.   Si se requiere una autorizacin previa para que su compaa de seguros [place] su medicamento, por favor permtanos de 1 a 2 das hbiles para completar [address]  Los precios de los medicamentos varan con frecuencia dependiendo del Environmental consultant de dnde se surte la receta y alguna farmacias pueden ofrecer precios ms baratos.  El sitio web www.goodrx.com tiene cupones para medicamentos de  Health and safety inspector. Los precios aqu no tienen en cuenta lo que podra costar con la ayuda del seguro (puede ser ms barato con su seguro), pero el sitio web puede darle el precio si no utiliz Tourist information centre manager.  - Puede imprimir el cupn correspondiente y llevarlo con su receta a la farmacia.  - Tambin puede pasar por nuestra oficina durante el horario de atencin regular y Education officer, museum una tarjeta de cupones de GoodRx.  - Si necesita que su receta se enve electrnicamente a una farmacia diferente, informe a nuestra oficina a travs de MyChart de [place] o por telfono llamando al [phone] y presione la opcin 4.

## 2023-08-20 NOTE — Progress Notes (Signed)
Follow-Up Visit   Subjective  Danielle Sanford is a 62 y.o. female who presents for the following: Cutaneous Sarcoidosis. Face, arms, ankles. Needs refills of hydroxychloroquine, Tacrolimus and Clobetasol cream. Would like prescription of mupirocin for open sores. Last visit to [facility] 11/14/2022.  The patient has spots, moles and lesions to be evaluated, some may be new or changing and the patient may have concern these could be cancer.   The following portions of the chart were reviewed this encounter and updated as appropriate: medications, allergies, medical history  Review of Systems:  No other skin or systemic complaints except as noted in HPI or Assessment and Plan.  Objective  Well appearing patient in no apparent distress; mood and affect are within normal limits.  A focused examination was performed of the following areas: Face, arms, legs  Relevant exam findings are noted in the Assessment and Plan.  face, arms, legs Pink/white smooth patches with crusted scaly area at right cheek. Pink scaly papules on nose/right nasal ala. Scarring of right eyebrow with some pink scaly papules. Scattered pink scaly papules at right jaw. Pink-white patches with scarring at right upper arm. Pink scaly plaque at left elbow. Pink white scar at right medial ankle. Pink scaly papules at knees.    Assessment & Plan     Sarcoidosis of skin face, arms, legs  Chronic and persistent condition with duration or expected duration over one year. Condition is symptomatic/ bothersome to patient. Not currently at goal. Currently not taking Plaquenil since ran out of rfs  11/24 CBC/diff, CMP reviewed, WNL Reviewed evaluation and recommendation from [facility] visit 11/14/2022  restart Hydroxychloroquine 200 mg twice daily, pt had her annual eye exam 2/23 which was normal.  She will continue to get annual eye exams due to being on med >5 years. Start Mupirocin once or twice  daily to any open/crusted sores until healed.  Continue Tacrolimus ointment Apply twice daily too affected areas of face and body Continue Clobetasol cream Apply twice daily to pink, active areas as needed only. Avoid applying to face, groin, and axilla. Use as directed. Long-term use can cause thinning of the skin.  Topical steroids (such as triamcinolone, fluocinolone, fluocinonide, mometasone, clobetasol, halobetasol, betamethasone, hydrocortisone) can cause thinning and lightening of the skin if they are used for too long in the same area. Your physician has selected the right strength medicine for your problem and area affected on the body. Please use your medication only as directed by your physician to prevent side effects.   Hydroxychloroquine (plaquenil) may cause sun sensitivity, so daily sun protection is recommended. It can rarely cause irreversible damage to the retina of the eye if used for a long period of time. This damage can be identified and the medication stopped before vision changes are noticed by going for a yearly ophthalmology exam.  Yearly eye exams are recommended if there are underlying risk factors for eye problems (i.e.diabetes), when higher dosage of medication per weight is used (>5mg /kg), and length of time on medication (>5 years). Rarely, this medicine can cause low blood counts, liver inflammation, low blood sugar, and/or a color change of the skin.  It should not be used together with certain medicines that can affect electrical conduction of the heart including macrolide antibiotics such as Azithromycin.  The use of Plaquenil requires long term medication management, including periodic office visits and monitoring of blood work.   Long term medication management.  Patient is using long  term (months to years) prescription medication  to control their dermatologic condition.  These medications require periodic monitoring to evaluate for efficacy and side effects and may  require periodic laboratory monitoring.      tacrolimus (PROTOPIC) 0.1 % ointment - face, arms, legs Apply twice daily too affected areas of face and body  mupirocin ointment (BACTROBAN) 2 % - face, arms, legs Apply twice daily to open sores until healed  Related Medications hydroxychloroquine (PLAQUENIL) 200 MG tablet Take 1 tablet (200 mg total) by mouth 2 (two) times daily.  Long-term use of high-risk medication  Cyst of skin  Seborrheic keratosis   EPIDERMAL INCLUSION CYST Exam: Subcutaneous nodule at right medial upper eyebrow, 8 mm firm subcutaneous nodule, Right upper back 5 cm subcutaneous SQ nodule.  Benign-appearing. Exam most consistent with an epidermal inclusion cyst. Discussed that a cyst is a benign growth that can grow over time and sometimes get irritated or inflamed. Recommend observation if it is not bothersome. Discussed option of surgical excision to remove it if it is growing, symptomatic, or other changes noted. Please call for new or changing lesions so they can be evaluated.  Cyst at eyebrow with symptoms and/or recent change.  Discussed surgical excision to remove, including resulting scar and possible recurrence.  Patient will schedule for surgery. Pre-op information given.   SEBORRHEIC KERATOSIS - Stuck-on, waxy, tan-brown papules and/or plaques on back. - Benign-appearing - Discussed benign etiology and prognosis. - Observe - Call for any changes   Return for Cyst Excision (face), Next Available, sarcoidosis follow up 6 months.  I, Lawson Radar, CMA, am acting as scribe for Willeen Niece, MD.   Documentation: I have reviewed the above documentation for accuracy and completeness, and I agree with the above.  Willeen Niece, MD

## 2023-09-05 ENCOUNTER — Other Ambulatory Visit: Payer: Self-pay | Admitting: Nurse Practitioner

## 2023-09-05 MED ORDER — PROPRANOLOL HCL 10 MG PO TABS: 10.0000 mg | ORAL_TABLET | Freq: Two times a day (BID) | ORAL | 0 refills | Status: DC

## 2023-09-05 NOTE — Telephone Encounter (Signed)
Medication Refill -  Most Recent Primary Care Visit:  Provider: Aura Dials T  Department: [facility]  Visit Type: OFFICE VISIT  Date: 07/30/2023  Medication: propranolol (INDERAL) 10 MG tablet [[id number]]   Has the patient contacted their pharmacy? No (Agent: If no, request that the patient contact the pharmacy for the refill. If patient does not wish to contact the pharmacy document the reason why and proceed with request.) Pt usually calls office for fills   Is this the correct pharmacy for this prescription? Yes  This is the patient's preferred pharmacy:   [facility] [address]), [place] - [address] [place]) [place] [address] Phone: [phone] Fax: [phone]   Has the prescription been filled recently? Yes  Is the patient out of the medication? Yes  Has the patient been seen for an appointment in the last year OR does the patient have an upcoming appointment? Yes  Can we respond through MyChart? No  Agent: Please be advised that Rx refills may take up to 3 business days. We ask that you follow-up with your pharmacy.

## 2023-09-05 NOTE — Telephone Encounter (Signed)
Requested Prescriptions  Pending Prescriptions Disp Refills   propranolol (INDERAL) 10 MG tablet 180 tablet 0    Sig: Take 1 tablet (10 mg total) by mouth 2 (two) times daily.     Cardiovascular:  Beta Blockers Passed - 09/05/2023  5:51 PM      Passed - Last BP in normal range    BP Readings from Last 1 Encounters:  07/30/23 125/74         Passed - Last Heart Rate in normal range    Pulse Readings from Last 1 Encounters:  07/30/23 77         Passed - Valid encounter within last 6 months    Recent Outpatient Visits           1 month ago Memory changes   [place] [facility], [place] T, NP   4 months ago Primary hypertension   [place] [facility] [place], [place] T, NP   11 months ago Primary hypertension   [place] [facility] [place], Corrie Dandy T, NP   1 year ago Primary hypertension   [place] [facility] [place], Corrie Dandy T, NP   2 years ago Primary hypertension   [place] [facility] [place], Dorie Rank, NP       Future Appointments             In 1 month [place], Dorie Rank, NP [facility]   In 6 months Willeen Niece, MD [facility]

## 2023-10-06 NOTE — Patient Instructions (Incomplete)

## 2023-10-10 ENCOUNTER — Other Ambulatory Visit: Payer: Self-pay | Admitting: Nurse Practitioner

## 2023-10-10 ENCOUNTER — Ambulatory Visit: Payer: BC Managed Care – PPO | Admitting: Nurse Practitioner

## 2023-10-10 DIAGNOSIS — D869 Sarcoidosis, unspecified: Secondary | ICD-10-CM

## 2023-10-10 DIAGNOSIS — E063 Autoimmune thyroiditis: Secondary | ICD-10-CM

## 2023-10-10 DIAGNOSIS — E782 Mixed hyperlipidemia: Secondary | ICD-10-CM

## 2023-10-10 DIAGNOSIS — I10 Essential (primary) hypertension: Secondary | ICD-10-CM

## 2023-10-10 NOTE — Telephone Encounter (Signed)
Requested Prescriptions  Pending Prescriptions Disp Refills   levothyroxine (SYNTHROID) 125 MCG tablet [Pharmacy Med Name: Levothyroxine Sodium 125 MCG Oral Tablet] 90 tablet 0    Sig: Take 1 tablet by mouth once daily     Endocrinology:  Hypothyroid Agents Passed - 10/10/2023 10:57 AM      Passed - TSH in normal range and within 360 days    TSH  Date Value Ref Range Status  07/30/2023 3.720 0.450 - 4.500 uIU/mL Final         Passed - Valid encounter within last 12 months    Recent Outpatient Visits           2 months ago Memory changes   [place] [facility] [place], Corrie Dandy T, NP   6 months ago Primary hypertension   [place] [facility] [place], Corrie Dandy T, NP   1 year ago Primary hypertension   [place] [facility] [place], Corrie Dandy T, NP   1 year ago Primary hypertension   [place] [facility] [place], Corrie Dandy T, NP   2 years ago Primary hypertension   [place] [facility] [place], Corrie Dandy T, NP       Future Appointments             In 5 months Willeen Niece, MD [facility]

## 2023-11-11 ENCOUNTER — Telehealth: Payer: Self-pay

## 2023-11-11 ENCOUNTER — Ambulatory Visit (INDEPENDENT_AMBULATORY_CARE_PROVIDER_SITE_OTHER): Payer: BC Managed Care – PPO | Admitting: Dermatology

## 2023-11-11 DIAGNOSIS — D492 Neoplasm of unspecified behavior of bone, soft tissue, and skin: Secondary | ICD-10-CM

## 2023-11-11 DIAGNOSIS — L308 Other specified dermatitis: Secondary | ICD-10-CM

## 2023-11-11 NOTE — Progress Notes (Signed)
   Follow-Up Visit   Subjective  Danielle Sanford is a 63 y.o. female who presents for the following: Cyst R medial upper eyebrow, pt presents for excision The patient has spots, moles and lesions to be evaluated, some may be new or changing and the patient may have concern these could be cancer.   The following portions of the chart were reviewed this encounter and updated as appropriate: medications, allergies, medical history  Review of Systems:  No other skin or systemic complaints except as noted in HPI or Assessment and Plan.  Objective  Well appearing patient in no apparent distress; mood and affect are within normal limits.   A focused examination was performed of the following areas: face  Relevant exam findings are noted in the Assessment and Plan.  R medial eyebrow Firm sub q nodule 1.0cm   Assessment & Plan     NEOPLASM OF SKIN R medial eyebrow Skin excision  Lesion length (cm):  1 Lesion width (cm):  1 Margin per side (cm):  0.1 Total excision diameter (cm):  1.2 Informed consent: discussed and consent obtained   Timeout: patient name, date of birth, surgical site, and procedure verified   Procedure prep:  Patient was prepped and draped in usual sterile fashion Procedure prep: Puracyn. Anesthesia: the lesion was anesthetized in a standard fashion   Anesthetic:  1% lidocaine w/ epinephrine 1-100,000 buffered w/ 8.4% NaHCO3 (3cc lido with epi) Instrument used comment:  15c blade Hemostasis achieved with: pressure and electrodesiccation   Outcome: patient tolerated procedure well with no complications    Skin repair Complexity:  Complex Final length (cm):  1.2 Undermining: edges could be approximated without difficulty and area extensively undermined   Subcutaneous layers (deep stitches):  Suture size:  5-0 Suture type: Vicryl (polyglactin 910)   Stitches:  Buried vertical mattress Fine/surface layer approximation (top stitches):  Suture size:   5-0 Suture type comment:  Nylon Stitches: simple interrupted   Suture removal (days):  7 Hemostasis achieved with: suture Outcome: patient tolerated procedure well with no complications   Post-procedure details: sterile dressing applied and wound care instructions given   Dressing type: pressure dressing (Mupirocin)   Specimen 1 - Surgical pathology Differential Diagnosis: D48.5 Cyst vs sarcoid vs other   Check Margins: No Cystic pap  Return in about 1 week (around 11/18/2023) for suture removal.  I, Sonya Hupman, RMA, am acting as scribe for Willeen Niece, MD .   Documentation: I have reviewed the above documentation for accuracy and completeness, and I agree with the above.  Willeen Niece, MD

## 2023-11-11 NOTE — Patient Instructions (Signed)

## 2023-11-11 NOTE — Telephone Encounter (Signed)
 Patient called to discuss cyst surgery procedure scheduled for today, she was wondering if she would be left with a hole on her face, discussed with patient she will not be left with a hole on her face, we will suture this area up after surgery and she will return in 7 days to have the sutures removed

## 2023-11-12 ENCOUNTER — Telehealth: Payer: Self-pay

## 2023-11-12 NOTE — Telephone Encounter (Signed)
 Left pt msg to call if any problems after yesterday's surgery.Danielle Sanford

## 2023-11-14 LAB — SURGICAL PATHOLOGY

## 2023-11-20 ENCOUNTER — Ambulatory Visit: Payer: BC Managed Care – PPO | Admitting: Dermatology

## 2023-11-20 DIAGNOSIS — D863 Sarcoidosis of skin: Secondary | ICD-10-CM

## 2023-11-20 NOTE — Progress Notes (Signed)
   Follow-Up Visit   Subjective  Danielle Sanford is a 63 y.o. female who presents for the following: Suture removal  Pathology showed GRANULOMATOUS DERMATITIS, COMPATIBLE WITH SARCOIDOSIS   The following portions of the chart were reviewed this encounter and updated as appropriate: medications, allergies, medical history  Review of Systems:  No other skin or systemic complaints except as noted in HPI or Assessment and Plan.  Objective  Well appearing patient in no apparent distress; mood and affect are within normal limits.  Areas Examined: Face  Relevant physical exam findings are noted in the Assessment and Plan.    Assessment & Plan    Encounter for Removal of Sutures - Incision site is clean, dry and intact. - Wound cleansed, sutures removed, wound cleansed and steri strips applied.  - Discussed pathology results showing GRANULOMATOUS DERMATITIS, COMPATIBLE WITH SARCOIDOSIS, discussed that it may recur, and if so, may require IL steroid injection - Patient advised to keep steri-strips dry until they fall off. - Scars remodel for a full year. - Once steri-strips fall off, patient can apply over-the-counter silicone scar cream once to twice a day to help with scar remodeling if desired. - Patient advised to call with any concerns or if they notice any new or changing lesions. Once area healed, patient may use topical Rx cream to treat. Discussed IL injection if recurs and becomes bothersome.   Return as scheduled, for with Dr Roseanne Reno for sarcoidosis.  ICherlyn Labella, CMA, am acting as scribe for Willeen Niece, MD .   Documentation: I have reviewed the above documentation for accuracy and completeness, and I agree with the above.  Willeen Niece, MD

## 2023-11-20 NOTE — Patient Instructions (Signed)

## 2023-12-24 ENCOUNTER — Telehealth: Payer: Self-pay | Admitting: Nurse Practitioner

## 2023-12-24 NOTE — Telephone Encounter (Signed)
 Called and spoke to the patient's husband. He states that he thinks the patient has been hearing things and hearing people talking. He states that he brings it up to the patient and she acts like she is confused and doesn't understand what he is saying. He states he is very worried about her. Patient is unaware that her husband has voiced these concerns to [place]. Husband also wonders if her sarcoidosis is causing this. Patient's husband wants [place] to reach out to the patient and see if we can get her appointment to come in to follow up and see about discussing these things with her. Will contact patient in the morning to see about scheduling.   Also routing to provider for any suggestions or comments to the husband's concerns.

## 2023-12-24 NOTE — Telephone Encounter (Signed)
 Patients husband came into the office and states that he can not get patient to come in the office for appoinments. He think there is something seriously wrong. She will not talk  or communicate and he hs several question about her overall/mental health.He would like a call from the medical staff to see how he can get her seen if can not get her in the office.Please advise.

## 2023-12-28 ENCOUNTER — Other Ambulatory Visit: Payer: Self-pay | Admitting: Nurse Practitioner

## 2023-12-30 ENCOUNTER — Other Ambulatory Visit: Payer: Self-pay | Admitting: Nurse Practitioner

## 2023-12-30 NOTE — Telephone Encounter (Signed)
 OV needed for additional refills.  Requested Prescriptions  Pending Prescriptions Disp Refills   propranolol (INDERAL) 10 MG tablet [Pharmacy Med Name: Propranolol HCl 10 MG Oral Tablet] 180 tablet 0    Sig: Take 1 tablet by mouth twice daily     Cardiovascular:  Beta Blockers Failed - 12/30/2023  1:56 PM      Failed - Valid encounter within last 6 months    Recent Outpatient Visits   None     Future Appointments             In 2 months Danielle Larry, MD [facility]            Passed - Last BP in normal range    BP Readings from Last 1 Encounters:  07/30/23 125/74         Passed - Last Heart Rate in normal range    Pulse Readings from Last 1 Encounters:  07/30/23 77

## 2023-12-30 NOTE — Telephone Encounter (Signed)
 Copied from CRM [id number]. Topic: Clinical - Medication Refill >> Dec 30, 2023 12:13 PM Jenice Mitts wrote: Most Recent Primary Care Visit:  Provider: CANNADY, JOLENE T  Department: [facility]  Visit Type: OFFICE VISIT  Date: 07/30/2023  Medication: propranolol (INDERAL) 10 MG tablet  Has the patient contacted their pharmacy? Yes (Agent: If no, request that the patient contact the pharmacy for the refill. If patient does not wish to contact the pharmacy document the reason why and proceed with request.) (Agent: If yes, when and what did the pharmacy advise?)  Is this the correct pharmacy for this prescription? Yes If no, delete pharmacy and type the correct one.  This is the patient's preferred pharmacy:   [facility] [address] ([address]), [place] - [address] [place]) [place] [address] Phone: [phone] Fax: [phone]   Has the prescription been filled recently? No  Is the patient out of the medication? Yes  Has the patient been seen for an appointment in the last year OR does the patient have an upcoming appointment? Yes  Can we respond through MyChart? Yes  Agent: Please be advised that Rx refills may take up to 3 business days. We ask that you follow-up with your pharmacy.

## 2023-12-30 NOTE — Telephone Encounter (Signed)
 Called and LVM asking for patient to please return my call.   OK for E2C2 to speak to patient and schedule follow up appointment for the patient.

## 2024-01-01 NOTE — Telephone Encounter (Signed)
 Called and LVM asking for patient to please return my call.   OK for E2C2 to speak to patient and schedule follow up visit if she calls back.

## 2024-01-07 ENCOUNTER — Telehealth: Payer: Self-pay

## 2024-01-07 NOTE — Telephone Encounter (Signed)
 Copied from CRM [id number]. Topic: Referral - Question >> Jan 07, 2024 11:40 AM Everlene Hobby D wrote: Haying psychiatric stuff going on her daughter has talked to her pcp about it. She want's to know how to get the referral to go to [facility]. Craige Dixon is the patient's daughter -[phone]

## 2024-01-07 NOTE — Telephone Encounter (Signed)
 Called and notified patient's daughter of Jolene's message. Daughter states that she will try to get her mother to schedule an appointment with us .

## 2024-01-10 ENCOUNTER — Other Ambulatory Visit: Payer: Self-pay | Admitting: Nurse Practitioner

## 2024-01-10 NOTE — Telephone Encounter (Signed)
 Requested Prescriptions  Pending Prescriptions Disp Refills   levothyroxine  (SYNTHROID ) 125 MCG tablet [Pharmacy Med Name: Levothyroxine  Sodium 125 MCG Oral Tablet] 90 tablet 0    Sig: Take 1 tablet by mouth once daily     Endocrinology:  Hypothyroid Agents Failed - 01/10/2024  5:04 PM      Failed - Valid encounter within last 12 months    Recent Outpatient Visits   None     Future Appointments             In 2 months Artemio Larry, MD [facility]            Passed - TSH in normal range and within 360 days    TSH  Date Value Ref Range Status  07/30/2023 3.720 0.450 - 4.500 uIU/mL Final

## 2024-01-28 NOTE — Telephone Encounter (Unsigned)
 Copied from CRM [id number]. Topic: Clinical - Medication Refill >> Jan 28, 2024 12:29 PM Tiffany S wrote: Medication: atorvastatin  (LIPITOR) 20 MG tablet [[id number]]  Has the patient contacted their pharmacy? Yes (Agent: If no, request that the patient contact the pharmacy for the refill. If patient does not wish to contact the pharmacy document the reason why and proceed with request.) (Agent: If yes, when and what did the pharmacy advise?)  This is the patient's preferred pharmacy:  [facility] - [place], [place] - [address] [place] [address] Phone: [phone] Fax: [phone]  [facility] [address]), [place] - [address] [place]) [place] [address] Phone: [phone] Fax: [phone]  Is this the correct pharmacy for this prescription? Yes If no, delete pharmacy and type the correct one.   Has the prescription been filled recently? Yes  Is the patient out of the medication? Yes  Has the patient been seen for an appointment in the last year OR does the patient have an upcoming appointment? Yes  Can we respond through MyChart? No  Agent: Please be advised that Rx refills may take up to 3 business days. We ask that you follow-up with your pharmacy.

## 2024-03-02 ENCOUNTER — Ambulatory Visit: Payer: BC Managed Care – PPO | Admitting: Dermatology

## 2024-03-07 NOTE — Patient Instructions (Incomplete)
 Please call to schedule your mammogram and/or bone density: [facility]  Address: [address], [place], [place] [address] Phone: [phone]  [facility] [address] [place],  [place]  [address] Phone: [phone]  Please call to schedule your mammogram and/or bone density: [facility]  Address: [address], [place], [place] [address] Phone: [phone]   [facility] at [facility] [address] [place],  [place]  [address] Phone: [phone]   Healthy Eating, Adult Healthy eating may help you get and keep a healthy body weight, reduce the risk of chronic disease, and live a long and productive life. It is important to follow a healthy eating pattern. Your nutritional and calorie needs should be met mainly by different nutrient-rich foods. What are tips for following this plan? Reading food labels Read labels and choose the following: Reduced or low sodium products. Juices with 100% fruit juice. Foods with low saturated fats (<3 g per serving) and high polyunsaturated and monounsaturated fats. Foods with whole grains, such as whole wheat, cracked wheat, brown rice, and wild rice. Whole grains that are fortified with folic acid. This is recommended for females who are pregnant or who want to become pregnant. Read labels and do not eat or drink the following: Foods or drinks with added sugars. These include foods that contain brown sugar, corn sweetener, corn syrup, dextrose, fructose, glucose, high-fructose corn syrup, honey, invert sugar, lactose, malt syrup, maltose, molasses, raw sugar, sucrose, trehalose, or turbinado sugar. Limit your intake of added sugars to less than 10% of your total daily calories. Do not eat more than the following amounts of added sugar per day: 6 teaspoons (25 g) for females. 9 teaspoons (38 g) for  males. Foods that contain processed or refined starches and grains. Refined grain products, such as white flour, degermed cornmeal, white bread, and white rice. Shopping Choose nutrient-rich snacks, such as vegetables, whole fruits, and nuts. Avoid high-calorie and high-sugar snacks, such as potato chips, fruit snacks, and candy. Use oil-based dressings and spreads on foods instead of solid fats such as butter, margarine, sour cream, or cream cheese. Limit pre-made sauces, mixes, and instant products such as flavored rice, instant noodles, and ready-made pasta. Try more plant-protein sources, such as tofu, tempeh, black beans, edamame, lentils, nuts, and seeds. Explore eating plans such as the Mediterranean diet or vegetarian diet. Try heart-healthy dips made with beans and healthy fats like hummus and guacamole. Vegetables go great with these. Cooking Use oil to saut or stir-fry foods instead of solid fats such as butter, margarine, or lard. Try baking, boiling, grilling, or broiling instead of frying. Remove the fatty part of meats before cooking. Steam vegetables in water or broth. Meal planning  At meals, imagine dividing your plate into fourths: One-half of your plate is fruits and vegetables. One-fourth of your plate is whole grains. One-fourth of your plate is protein, especially lean meats, poultry, eggs, tofu, beans, or nuts. Include low-fat dairy as part of your daily diet. Lifestyle Choose healthy options in all settings, including home, work, school, restaurants, or stores. Prepare your food safely: Wash your hands after handling raw meats. Where you prepare food, keep surfaces clean by regularly washing with hot, soapy water. Keep raw meats separate from ready-to-eat foods, such as fruits and vegetables. Cook seafood, meat, poultry, and eggs to the recommended temperature. Get a food thermometer. Store  foods at safe temperatures. In general: Keep cold foods at 56F  (4.4C) or below. Keep hot foods at 156F (60C) or above. Keep your freezer at [facility] (-17.8C) or below. Foods are not safe to eat if they have been between the temperatures of 40-156F (4.4-60C) for more than 2 hours. What foods should I eat? Fruits Aim to eat 1-2 cups of fresh, canned (in natural juice), or frozen fruits each day. One cup of fruit equals 1 small apple, 1 large banana, 8 large strawberries, 1 cup (237 g) canned fruit,  cup (82 g) dried fruit, or 1 cup (240 mL) 100% juice. Vegetables Aim to eat 2-4 cups of fresh and frozen vegetables each day, including different varieties and colors. One cup of vegetables equals 1 cup (91 g) broccoli or cauliflower florets, 2 medium carrots, 2 cups (150 g) raw, leafy greens, 1 large tomato, 1 large bell pepper, 1 large sweet potato, or 1 medium white potato. Grains Aim to eat 5-10 ounce-equivalents of whole grains each day. Examples of 1 ounce-equivalent of grains include 1 slice of bread, 1 cup (40 g) ready-to-eat cereal, 3 cups (24 g) popcorn, or  cup (93 g) cooked rice. Meats and other proteins Try to eat 5-7 ounce-equivalents of protein each day. Examples of 1 ounce-equivalent of protein include 1 egg,  oz nuts (12 almonds, 24 pistachios, or 7 walnut halves), 1/4 cup (90 g) cooked beans, 6 tablespoons (90 g) hummus or 1 tablespoon (16 g) peanut butter. A cut of meat or fish that is the size of a deck of cards is about 3-4 ounce-equivalents (85 g). Of the protein you eat each week, try to have at least 8 sounce (227 g) of seafood. This is about 2 servings per week. This includes salmon, trout, herring, sardines, and anchovies. Dairy Aim to eat 3 cup-equivalents of fat-free or low-fat dairy each day. Examples of 1 cup-equivalent of dairy include 1 cup (240 mL) milk, 8 ounces (250 g) yogurt, 1 ounces (44 g) natural cheese, or 1 cup (240 mL) fortified soy milk. Fats and oils Aim for about 5 teaspoons (21 g) of fats and oils per day. Choose  monounsaturated fats, such as canola and olive oils, mayonnaise made with olive oil or avocado oil, avocados, peanut butter, and most nuts, or polyunsaturated fats, such as sunflower, corn, and soybean oils, walnuts, pine nuts, sesame seeds, sunflower seeds, and flaxseed. Beverages Aim for 6 eight-ounce glasses of water per day. Limit coffee to 3-5 eight-ounce cups per day. Limit caffeinated beverages that have added calories, such as soda and energy drinks. If you drink alcohol: Limit how much you have to: 0-1 drink a day if you are female. 0-2 drinks a day if you are female. Know how much alcohol is in your drink. In the [place] one drink is one 12 oz bottle of beer (355 mL), one 5 oz glass of wine (148 mL), or one 1 oz glass of hard liquor (44 mL). Seasoning and other foods Try not to add too much salt to your food. Try using herbs and spices instead of salt. Try not to add sugar to food. This information is based on [place] nutrition guidelines. To learn more, visit [employer]posableNylon.be. Exact amounts may vary. You may need different amounts. This information is not intended to replace advice given to you by your health care provider. Make sure you discuss any questions you have with your health care provider. Document Revised: 06/04/2022 Document Reviewed: 06/04/2022 Elsevier Patient Education  2024  [employer].

## 2024-03-10 ENCOUNTER — Encounter: Payer: Self-pay | Admitting: Nurse Practitioner

## 2024-03-10 ENCOUNTER — Ambulatory Visit: Admitting: Nurse Practitioner

## 2024-03-10 VITALS — BP 109/68 | HR 67 | Temp 97.6°F | Ht 66.0 in | Wt 155.6 lb

## 2024-03-10 DIAGNOSIS — R413 Other amnesia: Secondary | ICD-10-CM

## 2024-03-10 DIAGNOSIS — I10 Essential (primary) hypertension: Secondary | ICD-10-CM

## 2024-03-10 DIAGNOSIS — D869 Sarcoidosis, unspecified: Secondary | ICD-10-CM | POA: Diagnosis not present

## 2024-03-10 DIAGNOSIS — E782 Mixed hyperlipidemia: Secondary | ICD-10-CM

## 2024-03-10 DIAGNOSIS — E063 Autoimmune thyroiditis: Secondary | ICD-10-CM

## 2024-03-10 DIAGNOSIS — E559 Vitamin D deficiency, unspecified: Secondary | ICD-10-CM

## 2024-03-10 DIAGNOSIS — Z1231 Encounter for screening mammogram for malignant neoplasm of breast: Secondary | ICD-10-CM

## 2024-03-10 MED ORDER — ATORVASTATIN CALCIUM 20 MG PO TABS: 20.0000 mg | ORAL_TABLET | Freq: Every day | ORAL | 4 refills | Status: AC

## 2024-03-10 MED ORDER — PROPRANOLOL HCL 10 MG PO TABS: 10.0000 mg | ORAL_TABLET | Freq: Two times a day (BID) | ORAL | 3 refills | Status: AC

## 2024-03-10 NOTE — Progress Notes (Signed)
 BP 109/68   Pulse 67   Temp 97.6 F (36.4 C) (Oral)   Ht 5' 6 (1.676 m)   Wt 155 lb 9.6 oz (70.6 kg)   LMP  (LMP Unknown)   SpO2 99%   BMI 25.11 kg/m    Subjective:    Patient ID: AIJALON DEMURO, female    DOB: 09/05/1961, 63 y.o.   MRN: [id number]  HPI: Danielle Sanford is a 63 y.o. female  Chief Complaint  Patient presents with   Hyperlipidemia   Hypertension   Hashimoto's Thyroiditis   Memory Changes   HYPERTENSION / HYPERLIPIDEMIA Taking Lipitor and Propranolol .  Has been working on weight loss. Satisfied with current treatment? no Duration of hypertension: years BP monitoring frequency: rarely BP range:  BP medication side effects: no Duration of hyperlipidemia: years Cholesterol medication side effects: no Cholesterol supplements: none Medication compliance: fair compliance Aspirin: no Recent stressors: no Recurrent headaches: no Visual changes: no Palpitations: no Dyspnea: no Chest pain: no Lower extremity edema: no Dizzy/lightheaded: no  The ASCVD Risk score (Arnett DK, et al., 2019) failed to calculate for the following reasons:   The valid total cholesterol range is 130 to 320 mg/dL     88/87/7975    6:77 PM  MMSE - Mini Mental State Exam  Orientation to time 5  Orientation to Place 5  Registration 3  Attention/ Calculation 4  Recall 2  Language- name 2 objects 2  Language- repeat 1  Language- follow 3 step command 3  Language- read & follow direction 1  Write a sentence 1  Copy design 1  Total score 28       03/10/2024    2:13 PM  6CIT Screen  What Year? 0 points  What month? 0 points  What time? 0 points  Count back from 20 0 points  Months in reverse 0 points  Repeat phrase 0 points  Total Score 0 points   HYPOTHYROIDISM Takes Levothyroxine  125 MCG. Continues Vitamin D  supplement for history of low levels. Thyroid  control status:stable Satisfied with current treatment? 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  Was followed by [facility] rheumatology, they last saw her on 05/17/2016 and she to return as needed.  Follows with dermatology, last visit 11/20/23, and visits with them annually.  History of skin cancer: no History of precancerous skin lesions: no Family history of skin cancer: no      03/10/2024    2:10 PM 09/21/2022    1:51 PM 10/24/2020    9:33 AM 08/23/2020   11:30 AM 01/27/2020   10:12 AM  Depression screen PHQ 2/9  Decreased Interest 0 0 0 0 0  Down, Depressed, Hopeless 0 0 0 0 0  PHQ - 2 Score 0 0 0 0 0  Altered sleeping 0 0 0    Tired, decreased energy 0 0 0    Change in appetite 0 0 0    Feeling bad or failure about yourself  0 0 0    Trouble concentrating 0 0 0    Moving slowly or fidgety/restless 0 0 0    Suicidal thoughts 0 0 0    PHQ-9 Score 0 0 0    Difficult  doing work/chores Not difficult at all Not difficult at all          03/10/2024    2:10 PM 09/21/2022    1:51 PM  GAD 7 : Generalized Anxiety Score  Nervous, Anxious, on Edge 0 0  Control/stop worrying 0 0  Worry too much - different things 0 0  Trouble relaxing 0 0  Restless 0 0  Easily annoyed or irritable 0 0  Afraid - awful might happen 0 0  Total GAD 7 Score 0 0  Anxiety Difficulty Not difficult at all Not difficult at all   Relevant past medical, surgical, family and social history reviewed and updated as indicated. Interim medical history since our last visit reviewed. Allergies and medications reviewed and updated.  Review of Systems  Constitutional:  Negative for activity change, appetite change, diaphoresis, fatigue and fever.  Respiratory:  Negative  for cough, chest tightness and shortness of breath.   Cardiovascular:  Negative for chest pain, palpitations and leg swelling.  Gastrointestinal: Negative.   Neurological: Negative.   Psychiatric/Behavioral: Negative.      Per HPI unless specifically indicated above     Objective:    BP 109/68   Pulse 67   Temp 97.6 F (36.4 C) (Oral)   Ht 5' 6 (1.676 m)   Wt 155 lb 9.6 oz (70.6 kg)   LMP  (LMP Unknown)   SpO2 99%   BMI 25.11 kg/m   Wt Readings from Last 3 Encounters:  03/10/24 155 lb 9.6 oz (70.6 kg)  07/30/23 164 lb 12.8 oz (74.8 kg)  04/09/23 172 lb 6.4 oz (78.2 kg)    Physical Exam Vitals and nursing note reviewed.  Constitutional:      General: She is awake. She is not in acute distress.    Appearance: She is well-developed and well-groomed. She is not ill-appearing or toxic-appearing.  HENT:     Head: Normocephalic.     Right Ear: Hearing and external ear normal.     Left Ear: Hearing and external ear normal.   Eyes:     General: Lids are normal.        Right eye: No discharge.        Left eye: No discharge.     Extraocular Movements: Extraocular movements intact.     Conjunctiva/sclera: Conjunctivae normal.     Pupils: Pupils are equal, round, and reactive to light.   Neck:     Thyroid : No thyromegaly.     Vascular: No carotid bruit.   Cardiovascular:     Rate and Rhythm: Normal rate and regular rhythm.     Heart sounds: Normal heart sounds. No murmur heard.    No gallop.  Pulmonary:     Effort: Pulmonary effort is normal. No accessory muscle usage or respiratory distress.     Breath sounds: Normal breath sounds. No decreased breath sounds, wheezing or rales.  Abdominal:     General: Bowel sounds are normal. There is no distension.     Palpations: Abdomen is soft.     Tenderness: There is no abdominal tenderness.   Musculoskeletal:     Cervical back: Normal range of motion and neck supple.     Right lower leg: No edema.     Left lower leg: No  edema.  Lymphadenopathy:     Cervical: No cervical adenopathy.   Skin:    General: Skin is warm and dry.   Neurological:     Mental Status: She is alert and  oriented to person, place, and time.     Cranial Nerves: Cranial nerves 2-12 are intact.     Gait: Gait is intact.     Deep Tendon Reflexes: Reflexes are normal and symmetric.     Reflex Scores:      Brachioradialis reflexes are 2+ on the right side and 2+ on the left side.      Patellar reflexes are 2+ on the right side and 2+ on the left side.  Psychiatric:        Attention and Perception: Attention normal.        Mood and Affect: Mood normal.        Speech: Speech normal.        Behavior: Behavior normal. Behavior is cooperative.        Thought Content: Thought content normal.    Results for orders placed or performed in visit on 11/11/23  Surgical pathology   Collection Time: 11/11/23 12:00 AM  Result Value Ref Range   SURGICAL PATHOLOGY      SURGICAL PATHOLOGY [facility] [address] [place], [place] [address] Telephone [phone] or [phone] Fax [phone]  REPORT OF DERMATOPATHOLOGY   Accession #: [id number] Patient Name: SHAUNTEL, PREST Visit # : [id number]  MRN: [id number] Cytotechnologist: Pearley Rolla Hedger, Dermatopathologist, Electronic Signature DOB/Age [birth year] (Age: 46) Gender: F Collected Date: 11/11/2023 Received Date: 11/12/2023  FINAL DIAGNOSIS       1. Skin (M), right medial eyebrow :       GRANULOMATOUS DERMATITIS, COMPATIBLE WITH SARCOIDOSIS       DATE SIGNED OUT: 11/14/2023 ELECTRONIC SIGNATURE : Pearley Md, Hyun, Dermatopathologist, Electronic Signature  MICROSCOPIC DESCRIPTION 1. There are epithelioid granulomas in the dermis.  The granulomas are largely devoid of a mantle of lymphocytes and show little necrosis.  No foreign material is observed upon examination with polarized light.  The changes are compatible with sarcoidosis.    A  PAS stain is negative for fungal hyphae, and the controls stained appropriately.  Special stains (AFB & Fite) for acid fast bacilli are negative.  CASE COMMENTS STAINS USED IN DIAGNOSIS: H&E Stains used in diagnosis 1 *Acid Fast Bacillus Stain, 1 *PAS/F Stain, 1 Fite Acid Fast Stain    CLINICAL HISTORY  SPECIMEN(S) OBTAINED 1. Skin (M), Right Medial Eyebrow  SPECIMEN COMMENTS: 1. Firm sub q nodule 1.0cm, cystic pap SPECIMEN CLINICAL INFORMATION: 1. Neoplasm of skin, Cyst vs sarcoid vs other    Gross Description 1. Formalin fixed specimen received:  8 X 6 X 10 MM, TOTO (3 P) (1 B) ( nn ) margins inked.        Report signed out from the following location(s) [facility] [address] [place], [place] [address] CLIA #: [id number]  [facility] [address] [place], [place] [address] CLIA #: [id number]       Assessment & Plan:   Problem List Items Addressed This Visit       Cardiovascular and Mediastinum   Hypertension - Primary   Chronic, stable.  BP at goal in office today.  Recommend she monitor BP at least a few mornings a week at home and document.  DASH diet at home.  Continue current medication regimen and adjust as needed.  Labs today: CBC, TSH, CMP.  Return in 6 months.       Relevant Medications   atorvastatin  (LIPITOR) 20 MG tablet   propranolol  (INDERAL ) 10 MG tablet  Endocrine   Hashimoto's thyroiditis   Chronic, stable.  Continue current medication regimen and adjust as needed.  TSH and Free T4 today.      Relevant Medications   propranolol  (INDERAL ) 10 MG tablet   Other Relevant Orders   T4, free   TSH     Other   Vitamin D  deficiency   Chronic, ongoing.  Continue supplement and adjust as needed.  Check Vit D.      Relevant Orders   VITAMIN D  25 Hydroxy (Vit-D Deficiency, Fractures)   Sarcoid   Followed by dermatology.  Continue regimen as prescribed by them, Plaquenil , sees them tnext in  December.  Reviewed recent note.  Labs today: CMP and CBC.  Return to rheumatology as needed.      Memory changes   Per family and some ongoing mood issues.  Recent labs reassuring.  MMSE 28/30 and 6CIT 0. Patient denies any concerns.      Relevant Orders   CBC with Differential/Platelet   Comprehensive metabolic panel with GFR   Hyperlipidemia   Chronic, stable.  Continue current medication regimen and adjust as needed.  Lipid panel today.      Relevant Medications   atorvastatin  (LIPITOR) 20 MG tablet   propranolol  (INDERAL ) 10 MG tablet   Other Relevant Orders   Comprehensive metabolic panel with GFR   Lipid Panel w/o Chol/HDL Ratio   Other Visit Diagnoses       Encounter for screening mammogram for malignant neoplasm of breast       Mammogram ordered and instructed how to schedule.   Relevant Orders   MM 3D SCREENING MAMMOGRAM BILATERAL BREAST        Follow up plan: Return in about 6 months (around 09/09/2024) for Annual Physical.

## 2024-03-10 NOTE — Assessment & Plan Note (Signed)
Chronic, stable.  Continue current medication regimen and adjust as needed.  TSH and Free T4 today.

## 2024-03-10 NOTE — Assessment & Plan Note (Signed)
 Per family and some ongoing mood issues.  Recent labs reassuring.  MMSE 28/30 and 6CIT 0. Patient denies any concerns.

## 2024-03-10 NOTE — Assessment & Plan Note (Signed)
 Chronic, stable.  Continue current medication regimen and adjust as needed.  Lipid panel today.

## 2024-03-10 NOTE — Assessment & Plan Note (Signed)
 Chronic, stable.  BP at goal in office today.  Recommend she monitor BP at least a few mornings a week at home and document.  DASH diet at home.  Continue current medication regimen and adjust as needed.  Labs today: CBC, TSH, CMP.  Return in 6 months.

## 2024-03-10 NOTE — Assessment & Plan Note (Signed)
Chronic, ongoing.  Continue supplement and adjust as needed.  Check Vit D.

## 2024-03-10 NOTE — Assessment & Plan Note (Signed)
Followed by dermatology.  Continue regimen as prescribed by them, Plaquenil, sees them tnext in December.  Reviewed recent note.  Labs today: CMP and CBC.  Return to rheumatology as needed.

## 2024-03-11 ENCOUNTER — Ambulatory Visit: Payer: Self-pay | Admitting: Nurse Practitioner

## 2024-03-11 DIAGNOSIS — R7989 Other specified abnormal findings of blood chemistry: Secondary | ICD-10-CM

## 2024-03-11 LAB — LIPID PANEL W/O CHOL/HDL RATIO
Cholesterol, Total: 187 mg/dL (ref 100–199)
HDL: 39 mg/dL — ABNORMAL LOW (ref >39)
LDL Chol Calc (NIH): 128 mg/dL — ABNORMAL HIGH (ref 0–99)
Triglycerides: 109 mg/dL (ref 0–149)
VLDL Cholesterol Cal: 20 mg/dL (ref 5–40)

## 2024-03-11 LAB — T4, FREE: Free T4: 1.75 ng/dL (ref 0.82–1.77)

## 2024-03-11 LAB — CBC WITH DIFFERENTIAL/PLATELET
Basophils Absolute: 0 10*3/uL (ref 0.0–0.2)
Basos: 1 %
EOS (ABSOLUTE): 0.1 10*3/uL (ref 0.0–0.4)
Eos: 3 %
Hematocrit: 39.2 % (ref 34.0–46.6)
Hemoglobin: 12.4 g/dL (ref 11.1–15.9)
Immature Grans (Abs): 0 10*3/uL (ref 0.0–0.1)
Immature Granulocytes: 0 %
Lymphocytes Absolute: 0.5 10*3/uL — ABNORMAL LOW (ref 0.7–3.1)
Lymphs: 16 %
MCH: 27 pg (ref 26.6–33.0)
MCHC: 31.6 g/dL (ref 31.5–35.7)
MCV: 85 fL (ref 79–97)
Monocytes Absolute: 0.3 10*3/uL (ref 0.1–0.9)
Monocytes: 10 %
Neutrophils Absolute: 2.3 10*3/uL (ref 1.4–7.0)
Neutrophils: 70 %
Platelets: 206 10*3/uL (ref 150–450)
RBC: 4.6 x10E6/uL (ref 3.77–5.28)
RDW: 13 % (ref 11.7–15.4)
WBC: 3.3 10*3/uL — ABNORMAL LOW (ref 3.4–10.8)

## 2024-03-11 LAB — COMPREHENSIVE METABOLIC PANEL WITH GFR
ALT: 8 IU/L (ref 0–32)
AST: 18 IU/L (ref 0–40)
Albumin: 3.7 g/dL — ABNORMAL LOW (ref 3.9–4.9)
Alkaline Phosphatase: 114 IU/L (ref 44–121)
BUN/Creatinine Ratio: 11 — ABNORMAL LOW (ref 12–28)
BUN: 9 mg/dL (ref 8–27)
Bilirubin Total: 0.6 mg/dL (ref 0.0–1.2)
CO2: 21 mmol/L (ref 20–29)
Calcium: 9.4 mg/dL (ref 8.7–10.3)
Chloride: 100 mmol/L (ref 96–106)
Creatinine, Ser: 0.82 mg/dL (ref 0.57–1.00)
Globulin, Total: 3.3 g/dL (ref 1.5–4.5)
Glucose: 73 mg/dL (ref 70–99)
Potassium: 4.1 mmol/L (ref 3.5–5.2)
Sodium: 139 mmol/L (ref 134–144)
Total Protein: 7 g/dL (ref 6.0–8.5)
eGFR: 80 mL/min/{1.73_m2} (ref >59)

## 2024-03-11 LAB — VITAMIN D 25 HYDROXY (VIT D DEFICIENCY, FRACTURES): Vit D, 25-Hydroxy: 7.7 ng/mL — ABNORMAL LOW (ref 30.0–100.0)

## 2024-03-11 LAB — TSH: TSH: 2.72 u[IU]/mL (ref 0.450–4.500)

## 2024-03-11 MED ORDER — CHOLECALCIFEROL 1.25 MG (50000 UT) PO CAPS: 50000.0000 [IU] | ORAL_CAPSULE | ORAL | 3 refills | Status: AC

## 2024-03-11 NOTE — Progress Notes (Signed)
 Lab ordered.

## 2024-03-11 NOTE — Progress Notes (Signed)
 Please let Danielle Sanford know her labs have returned + she needs a lab only visit in 6 weeks please: Good day Danielle Sanford, your labs have returned: - CBC is showing mildly low white blood cell count and lymphocyte count, I would like you to return in 6 weeks to recheck this outpatient please. - Kidney and liver function are normal. - Thyroid  levels stable, continue current Levothyroxine  dose. - Vitamin D  remains quite low, I want her to start the weekly Vitamin D3 supplement I sent in. - Lipid panel is showing trend up in LDL, are you taking Atorvastatin  as ordered daily? Please ensure you do for both heart and brain protection.  Any questions? Keep being amazing!!  Thank you for allowing me to participate in your care.  I appreciate you. Kindest regards, Danielle Sanford

## 2024-03-23 ENCOUNTER — Telehealth: Payer: Self-pay

## 2024-03-23 NOTE — Telephone Encounter (Signed)
 Copied from CRM [id number]. Topic: Clinical - Prescription Issue >> Mar 23, 2024 11:28 AM Ivette P wrote: Reason for CRM: pt called in because her recent labs showed her vitamin D  was low, pt was prescribed medication but pt states she is allergic to that medication and cannot take. Pt would like to know if another option is available.    Pls follow up with pt [phone]

## 2024-03-23 NOTE — Telephone Encounter (Signed)
Called and notified patient of Jolene's message. Patient verbalized understanding.

## 2024-03-24 ENCOUNTER — Ambulatory Visit: Payer: BC Managed Care – PPO | Admitting: Dermatology

## 2024-03-24 DIAGNOSIS — L720 Epidermal cyst: Secondary | ICD-10-CM | POA: Diagnosis not present

## 2024-03-24 DIAGNOSIS — Z7189 Other specified counseling: Secondary | ICD-10-CM | POA: Diagnosis not present

## 2024-03-24 DIAGNOSIS — Z79899 Other long term (current) drug therapy: Secondary | ICD-10-CM

## 2024-03-24 DIAGNOSIS — D863 Sarcoidosis of skin: Secondary | ICD-10-CM

## 2024-03-24 DIAGNOSIS — L729 Follicular cyst of the skin and subcutaneous tissue, unspecified: Secondary | ICD-10-CM

## 2024-03-24 MED ORDER — HYDROXYCHLOROQUINE SULFATE 200 MG PO TABS: 200.0000 mg | ORAL_TABLET | Freq: Two times a day (BID) | ORAL | 1 refills | Status: DC

## 2024-03-24 NOTE — Progress Notes (Signed)
 Follow-Up Visit   Subjective  Danielle Sanford is a 63 y.o. female who presents for the following: Cutaneous Sarcoidosis. Face, arms, ankles. Patient takes Hydroxychloroquine  200 mg twice daily with no side effects, uses mupirocin , tacrolimus , and clobetasol  cream prn flares.   The patient has spots, moles and lesions to be evaluated, some may be new or changing and the patient may have concern these could be cancer.   The following portions of the chart were reviewed this encounter and updated as appropriate: medications, allergies, medical history  Review of Systems:  No other skin or systemic complaints except as noted in HPI or Assessment and Plan.  Objective  Well appearing patient in no apparent distress; mood and affect are within normal limits.  A focused examination was performed of the following areas: Face, arms, leg  Relevant exam findings are noted in the Assessment and Plan.  Face, arms, legs Dispigmented patches and plaques with scar at R upper arm, L elbow, R ankle, R knee. Scarred hyperkeratotic plaque at R mid cheek.   Assessment & Plan   EPIDERMAL INCLUSION CYST Exam: 3 cm firm subQ nodule at R spinal upper back  Benign-appearing. Exam most consistent with an epidermal inclusion cyst. Discussed that a cyst is a benign growth that can grow over time and sometimes get irritated or inflamed. Recommend observation if it is not bothersome. Discussed option of surgical excision to remove it if it is growing, symptomatic, or other changes noted. Please call for new or changing lesions so they can be evaluated.  Discussed would need surgery to excise and would leave large scar. Pt prefers to observe for now.  SARCOIDOSIS OF SKIN Face, arms, legs Chronic and persistent condition with duration or expected duration over one year. Condition is improving with treatment but not currently at goal.   CBC, CMP labs reviewed from 03/10/24, WNL Patient will call [facility] and have them fax over report to us , last eye exam report reviewed from 10/18/2022 and was normal. Patient advised how important it is to have annual eye exams due to rare side effect causing patient loss of vision, higher risk after being on medication more than 5 years.   Continue Hydroxychloroquine  200 mg twice daily. Patient advised that we may have to adjust dosing due to patient losing weight.  Will advise staying at twice daily M-F and once daily on Sat/Sun.  Continue Tacrolimus  0.1% ointment, apply twice daily too affected areas of face and body.  Continue Clobetasol  cream, apply twice daily to pink, active areas as needed only. Do not use more than 2 weeks, then switch over to tacrolimus . Avoid applying to face, groin, and axilla. Use as directed. Long-term use can cause thinning of the skin.  Topical steroids (such as triamcinolone , fluocinolone, fluocinonide, mometasone, clobetasol , halobetasol, betamethasone, hydrocortisone) can cause thinning and lightening of the skin if they are used for too long in the same area. Your physician has selected the right strength medicine for your problem and area affected on the body. Please use your medication only as directed by your physician to prevent side effects.   Hydroxychloroquine  (plaquenil ) may cause sun sensitivity, so daily sun protection is recommended. It can rarely cause irreversible damage to the retina of the eye if used for a long period of time. This damage can be identified and the medication stopped before vision changes are noticed by going for a yearly ophthalmology exam.  Yearly eye exams are recommended if there are underlying  risk factors for eye problems (i.e.diabetes), when higher dosage of medication per weight is used (>5mg /kg), and length of time on medication (>5 years). Rarely, this medicine can cause low blood counts, liver inflammation, low blood sugar, and/or a color change of the skin.  It should not be used  together with certain medicines that can affect electrical conduction of the heart including macrolide antibiotics such as Azithromycin.  The use of Plaquenil  requires long term medication management, including periodic office visits and monitoring of blood work.   Long term medication management.  Patient is using long term (months to years) prescription medication  to control their dermatologic condition.  These medications require periodic monitoring to evaluate for efficacy and side effects and may require periodic laboratory monitoring.   Recommend CeraVe rough and bumpy, use in combination with other topicals prescribed. Samples and coupon given to patient.   Recommend starting moisturizer with exfoliant (Urea , Salicylic acid, or Lactic acid) one to two times daily to help smooth rough and bumpy skin.  OTC options include Cetaphil Rough and Bumpy lotion (Urea ), Eucerin Roughness Relief lotion or spot treatment cream (Urea ), CeraVe SA lotion/cream for Rough and Bumpy skin (Sal Acid), Gold Bond Rough and Bumpy cream (Sal Acid), and AmLactin 12% lotion/cream (Lactic Acid).  If applying in morning, also apply sunscreen to sun-exposed areas, since these exfoliating moisturizers can increase sensitivity to sun.    Related Medications tacrolimus  (PROTOPIC ) 0.1 % ointment Apply twice daily too affected areas of face and body mupirocin  ointment (BACTROBAN ) 2 % Apply twice daily to open sores until healed hydroxychloroquine  (PLAQUENIL ) 200 MG tablet Take 1 tablet (200 mg total) by mouth 2 (two) times daily.  Return in about 6 months (around 09/24/2024) for w/ Dr. Jackquline Sarcoidosis f/u.  I, Jacquelynn V. Wilfred, CMA, am acting as scribe for Rexene Jackquline, MD .   Documentation: I have reviewed the above documentation for accuracy and completeness, and I agree with the above.  Rexene Jackquline, MD

## 2024-03-24 NOTE — Patient Instructions (Signed)

## 2024-04-20 ENCOUNTER — Telehealth: Payer: Self-pay | Admitting: Nurse Practitioner

## 2024-04-20 ENCOUNTER — Telehealth: Payer: Self-pay

## 2024-04-20 NOTE — Telephone Encounter (Unsigned)
 Copied from CRM [id number]. Topic: Clinical - Medical Advice >> Apr 20, 2024  9:39 AM Nathanel BROCKS wrote: Reason for CRM: hears things, not acting right, loss of weight   ----------------------------------------------------------------------- From previous Reason for Contact - Pink Word Triage: Reason for Triage:

## 2024-04-20 NOTE — Telephone Encounter (Unsigned)
 Copied from CRM [id number]. Topic: Clinical - Medication Prior Auth >> Apr 20, 2024  9:34 AM Nathanel BROCKS wrote: Reason for CRM: ***mental focus is off, hears things. Weight loss, blood work could possibly be off.

## 2024-04-20 NOTE — Telephone Encounter (Signed)
 Copied from CRM [id number]. Topic: Clinical - Medication Prior Auth >> Apr 20, 2024  9:34 AM Nathanel BROCKS wrote: Reason for CRM: ***mental focus is off, hears things. Weight loss, blood work could possibly be off.

## 2024-04-21 ENCOUNTER — Other Ambulatory Visit

## 2024-06-10 ENCOUNTER — Other Ambulatory Visit: Payer: Self-pay | Admitting: Dermatology

## 2024-06-10 DIAGNOSIS — D863 Sarcoidosis of skin: Secondary | ICD-10-CM

## 2024-07-17 ENCOUNTER — Other Ambulatory Visit: Payer: Self-pay | Admitting: Nurse Practitioner

## 2024-07-18 NOTE — Telephone Encounter (Signed)
 Requested Prescriptions  Pending Prescriptions Disp Refills   levothyroxine  (SYNTHROID ) 125 MCG tablet [Pharmacy Med Name: Levothyroxine  Sodium 125 MCG Oral Tablet] 90 tablet 0    Sig: Take 1 tablet by mouth once daily     Endocrinology:  Hypothyroid Agents Passed - 07/18/2024 10:28 AM      Passed - TSH in normal range and within 360 days    TSH  Date Value Ref Range Status  03/10/2024 2.720 0.450 - 4.500 uIU/mL Final         Passed - Valid encounter within last 12 months    Recent Outpatient Visits           4 months ago Primary hypertension   [place] [facility] [place], Melanie DASEN, NP       Future Appointments             In 2 months Jackquline Sawyer, MD [facility]

## 2024-07-21 ENCOUNTER — Ambulatory Visit: Admitting: Nurse Practitioner

## 2024-07-29 ENCOUNTER — Other Ambulatory Visit: Payer: Self-pay | Admitting: Dermatology

## 2024-07-29 DIAGNOSIS — D863 Sarcoidosis of skin: Secondary | ICD-10-CM

## 2024-09-22 ENCOUNTER — Ambulatory Visit: Admitting: Dermatology

## 2024-10-08 ENCOUNTER — Other Ambulatory Visit: Payer: Self-pay | Admitting: Nurse Practitioner

## 2024-10-08 ENCOUNTER — Other Ambulatory Visit: Payer: Self-pay | Admitting: Dermatology

## 2024-10-08 DIAGNOSIS — D863 Sarcoidosis of skin: Secondary | ICD-10-CM

## 2024-10-08 NOTE — Telephone Encounter (Signed)
 Requested Prescriptions  Pending Prescriptions Disp Refills   levothyroxine  (SYNTHROID ) 125 MCG tablet [Pharmacy Med Name: Levothyroxine  Sodium 125 MCG Oral Tablet] 90 tablet 0    Sig: Take 1 tablet by mouth once daily     Endocrinology:  Hypothyroid Agents Passed - 10/08/2024  1:41 PM      Passed - TSH in normal range and within 360 days    TSH  Date Value Ref Range Status  03/10/2024 2.720 0.450 - 4.500 uIU/mL Final         Passed - Valid encounter within last 12 months    Recent Outpatient Visits           7 months ago Primary hypertension   [place] [facility] [place], Melanie DASEN, NP
